# Patient Record
Sex: Male | Born: 1935 | Race: White | Hispanic: No | Smoking: Former smoker
Health system: Southern US, Community
[De-identification: ages and names within clinical notes are randomized; demographics above are authoritative.]

## PROBLEM LIST (undated history)

## (undated) DIAGNOSIS — I10 Essential (primary) hypertension: Secondary | ICD-10-CM

## (undated) DIAGNOSIS — N400 Enlarged prostate without lower urinary tract symptoms: Secondary | ICD-10-CM

## (undated) DIAGNOSIS — I714 Abdominal aortic aneurysm, without rupture, unspecified: Secondary | ICD-10-CM

## (undated) DIAGNOSIS — K649 Unspecified hemorrhoids: Secondary | ICD-10-CM

## (undated) HISTORY — DX: Abdominal aortic aneurysm, without rupture: I71.4

## (undated) HISTORY — PX: CAROTID STENT: SHX1301

## (undated) HISTORY — DX: Abdominal aortic aneurysm, without rupture, unspecified: I71.40

---

## 2012-12-19 ENCOUNTER — Encounter (HOSPITAL_COMMUNITY): Payer: Self-pay | Admitting: Nurse Practitioner

## 2012-12-19 ENCOUNTER — Inpatient Hospital Stay (HOSPITAL_COMMUNITY): Payer: Medicare Other

## 2012-12-19 ENCOUNTER — Inpatient Hospital Stay (HOSPITAL_COMMUNITY)
Admission: AD | Admit: 2012-12-19 | Discharge: 2013-01-02 | DRG: 246 | Disposition: A | Discharge status: Home Health | Payer: Medicare Other | Source: Other Acute Inpatient Hospital | Attending: Cardiology | Admitting: Cardiology

## 2012-12-19 DIAGNOSIS — I4949 Other premature depolarization: Secondary | ICD-10-CM | POA: Diagnosis present

## 2012-12-19 DIAGNOSIS — I5021 Acute systolic (congestive) heart failure: Secondary | ICD-10-CM

## 2012-12-19 DIAGNOSIS — K649 Unspecified hemorrhoids: Secondary | ICD-10-CM | POA: Diagnosis present

## 2012-12-19 DIAGNOSIS — M349 Systemic sclerosis, unspecified: Secondary | ICD-10-CM | POA: Diagnosis present

## 2012-12-19 DIAGNOSIS — Z87891 Personal history of nicotine dependence: Secondary | ICD-10-CM

## 2012-12-19 DIAGNOSIS — J9620 Acute and chronic respiratory failure, unspecified whether with hypoxia or hypercapnia: Secondary | ICD-10-CM | POA: Diagnosis present

## 2012-12-19 DIAGNOSIS — I509 Heart failure, unspecified: Secondary | ICD-10-CM

## 2012-12-19 DIAGNOSIS — I491 Atrial premature depolarization: Secondary | ICD-10-CM | POA: Diagnosis present

## 2012-12-19 DIAGNOSIS — I129 Hypertensive chronic kidney disease with stage 1 through stage 4 chronic kidney disease, or unspecified chronic kidney disease: Secondary | ICD-10-CM | POA: Diagnosis present

## 2012-12-19 DIAGNOSIS — I25810 Atherosclerosis of coronary artery bypass graft(s) without angina pectoris: Secondary | ICD-10-CM

## 2012-12-19 DIAGNOSIS — N390 Urinary tract infection, site not specified: Secondary | ICD-10-CM | POA: Diagnosis present

## 2012-12-19 DIAGNOSIS — K224 Dyskinesia of esophagus: Secondary | ICD-10-CM | POA: Diagnosis not present

## 2012-12-19 DIAGNOSIS — N189 Chronic kidney disease, unspecified: Secondary | ICD-10-CM | POA: Diagnosis present

## 2012-12-19 DIAGNOSIS — N179 Acute kidney failure, unspecified: Secondary | ICD-10-CM | POA: Diagnosis present

## 2012-12-19 DIAGNOSIS — I214 Non-ST elevation (NSTEMI) myocardial infarction: Secondary | ICD-10-CM

## 2012-12-19 DIAGNOSIS — E876 Hypokalemia: Secondary | ICD-10-CM | POA: Diagnosis not present

## 2012-12-19 DIAGNOSIS — J984 Other disorders of lung: Secondary | ICD-10-CM | POA: Diagnosis not present

## 2012-12-19 DIAGNOSIS — J8410 Pulmonary fibrosis, unspecified: Secondary | ICD-10-CM

## 2012-12-19 DIAGNOSIS — I2510 Atherosclerotic heart disease of native coronary artery without angina pectoris: Secondary | ICD-10-CM | POA: Diagnosis present

## 2012-12-19 DIAGNOSIS — J189 Pneumonia, unspecified organism: Secondary | ICD-10-CM

## 2012-12-19 DIAGNOSIS — I2589 Other forms of chronic ischemic heart disease: Secondary | ICD-10-CM | POA: Diagnosis present

## 2012-12-19 DIAGNOSIS — N400 Enlarged prostate without lower urinary tract symptoms: Secondary | ICD-10-CM | POA: Diagnosis present

## 2012-12-19 DIAGNOSIS — R911 Solitary pulmonary nodule: Secondary | ICD-10-CM | POA: Diagnosis not present

## 2012-12-19 DIAGNOSIS — I2584 Coronary atherosclerosis due to calcified coronary lesion: Secondary | ICD-10-CM | POA: Diagnosis present

## 2012-12-19 DIAGNOSIS — R918 Other nonspecific abnormal finding of lung field: Secondary | ICD-10-CM

## 2012-12-19 HISTORY — DX: Unspecified hemorrhoids: K64.9

## 2012-12-19 HISTORY — DX: Benign prostatic hyperplasia without lower urinary tract symptoms: N40.0

## 2012-12-19 HISTORY — DX: Essential (primary) hypertension: I10

## 2012-12-19 LAB — POCT I-STAT 3, ART BLOOD GAS (G3+)
pCO2 arterial: 31.6 mmHg — ABNORMAL LOW (ref 35.0–45.0)
pH, Arterial: 7.517 — ABNORMAL HIGH (ref 7.350–7.450)

## 2012-12-19 LAB — HEMOGLOBIN A1C: Mean Plasma Glucose: 131 mg/dL — ABNORMAL HIGH (ref <117)

## 2012-12-19 LAB — TROPONIN I
Troponin I: 8.56 ng/mL (ref <0.30)
Troponin I: 9.67 ng/mL (ref <0.30)

## 2012-12-19 LAB — MRSA PCR SCREENING: MRSA by PCR: NEGATIVE

## 2012-12-19 MED ORDER — ACETAMINOPHEN 325 MG PO TABS
650.0000 mg | ORAL_TABLET | ORAL | Status: DC | PRN
  Administered 2012-12-19: 650 mg via ORAL
  Filled 2012-12-19: qty 2

## 2012-12-19 MED ORDER — ONDANSETRON HCL 4 MG/2ML IJ SOLN: 4.0000 mg | Freq: Four times a day (QID) | INTRAMUSCULAR | Status: DC | PRN

## 2012-12-19 MED ORDER — ATORVASTATIN CALCIUM 80 MG PO TABS
80.0000 mg | ORAL_TABLET | Freq: Every day | ORAL | Status: DC
  Administered 2012-12-19 – 2013-01-01 (×14): 80 mg via ORAL
  Filled 2012-12-19 (×16): qty 1

## 2012-12-19 MED ORDER — NITROGLYCERIN IN D5W 200-5 MCG/ML-% IV SOLN
2.0000 ug/min | INTRAVENOUS | Status: DC
  Administered 2012-12-19: 3 ug/min via INTRAVENOUS
  Administered 2012-12-20: 19 ug/min via INTRAVENOUS
  Administered 2012-12-21: 20 ug/min via INTRAVENOUS
  Filled 2012-12-19 (×2): qty 250

## 2012-12-19 MED ORDER — SODIUM CHLORIDE 0.9 % IJ SOLN
3.0000 mL | Freq: Two times a day (BID) | INTRAMUSCULAR | Status: DC
  Administered 2012-12-19: 6 mL via INTRAVENOUS
  Administered 2012-12-20 – 2013-01-01 (×11): 3 mL via INTRAVENOUS

## 2012-12-19 MED ORDER — HEPARIN (PORCINE) IN NACL 100-0.45 UNIT/ML-% IJ SOLN
1450.0000 [IU]/h | INTRAMUSCULAR | Status: DC
  Administered 2012-12-19: 1200 [IU]/h via INTRAVENOUS
  Filled 2012-12-19 (×2): qty 250

## 2012-12-19 MED ORDER — SODIUM CHLORIDE 0.9 % IJ SOLN: 3.0000 mL | INTRAMUSCULAR | Status: DC | PRN

## 2012-12-19 MED ORDER — ASPIRIN EC 81 MG PO TBEC
81.0000 mg | DELAYED_RELEASE_TABLET | Freq: Every day | ORAL | Status: DC
  Administered 2012-12-20 – 2013-01-02 (×13): 81 mg via ORAL
  Filled 2012-12-19 (×14): qty 1

## 2012-12-19 MED ORDER — SODIUM CHLORIDE 0.9 % IV SOLN: 250.0000 mL | INTRAVENOUS | Status: DC | PRN

## 2012-12-19 MED ORDER — NITROGLYCERIN 0.4 MG SL SUBL: 0.4000 mg | SUBLINGUAL_TABLET | SUBLINGUAL | Status: DC | PRN

## 2012-12-19 MED ORDER — CIPROFLOXACIN HCL 250 MG PO TABS
250.0000 mg | ORAL_TABLET | Freq: Two times a day (BID) | ORAL | Status: DC
  Administered 2012-12-19 – 2012-12-24 (×10): 250 mg via ORAL
  Filled 2012-12-19 (×12): qty 1

## 2012-12-19 MED ORDER — ALBUTEROL SULFATE (5 MG/ML) 0.5% IN NEBU
2.5000 mg | INHALATION_SOLUTION | RESPIRATORY_TRACT | Status: DC | PRN
  Administered 2013-01-01: 2.5 mg via RESPIRATORY_TRACT

## 2012-12-19 MED ORDER — FUROSEMIDE 10 MG/ML IJ SOLN
80.0000 mg | Freq: Two times a day (BID) | INTRAMUSCULAR | Status: DC
  Administered 2012-12-19 – 2012-12-21 (×4): 80 mg via INTRAVENOUS
  Filled 2012-12-19 (×6): qty 8

## 2012-12-19 MED ORDER — CARVEDILOL 3.125 MG PO TABS
3.1250 mg | ORAL_TABLET | Freq: Two times a day (BID) | ORAL | Status: DC
  Administered 2012-12-19 – 2012-12-28 (×19): 3.125 mg via ORAL
  Filled 2012-12-19 (×24): qty 1

## 2012-12-19 MED FILL — Furosemide Inj 10 MG/ML: INTRAMUSCULAR | Qty: 10 | Status: AC

## 2012-12-19 NOTE — Progress Notes (Addendum)
ANTICOAGULATION CONSULT NOTE - Initial Consult  Pharmacy Consult for Heparin Indication: chest pain/ACS  No Known Allergies  Patient Measurements: 80 kg  Vital Signs: Temp: 97.9 F (36.6 C) (05/01 1500) Temp src: Oral (05/01 1500) BP: 126/67 mmHg (05/01 1500) Pulse Rate: 84 (05/01 1500)  Labs: No results found for this basename: HGB, HCT, PLT, APTT, LABPROT, INR, HEPARINUNFRC, CREATININE, CKTOTAL, CKMB, TROPONINI,  in the last 72 hours  CrCl is unknown because no creatinine reading has been taken and the patient has no height on file.   Medical History: Past Medical History  Diagnosis Date  . Hypertension   . BPH (benign prostatic hyperplasia)   . Hemorrhoids    Assessment: 79 year old male transferred from [facility] with CP.  For cardiac cath in AM.  Had Lovenox at 12:30 pm today  Goal of Therapy:  Heparin level 0.3-0.7 units/ml Monitor platelets by anticoagulation protocol: Yes   Plan:  1) No heparin bolus 2) Heparin drip at 1200 units / hr starting at 9 pm 3) Daily heparin level / CBC  Thank you. Okey Regal, PharmD [phone]  12/19/2012,3:57 PM

## 2012-12-19 NOTE — Progress Notes (Signed)
CRITICAL VALUE ALERT  Critical value received:  Troponin 9.67  Date of notification:  12/19/2012   Time of notification:  2227  Critical value read back:yes  Nurse who received alert:  Viviano Simas   MD notified (1st page):  Leeann Must  Time of first page:  2227  MD notified (2nd page):  Time of second page:  Responding MD:  Leeann Must  Time MD responded:  2227

## 2012-12-19 NOTE — H&P (Addendum)
Patient ID: Christopher [name] MRN: [id number], DOB/AGE: [birth year]   Admit date: 12/19/2012  Primary Physician: Harlow Asa, MD Primary Cardiologist: new - f/u [facility].  Pt. Profile:  79  Y/o male w/o prior cardiac hx who presents on tx from [facility] with NSTEMI.  Problem List  Past Medical History  Diagnosis Date  . Hypertension   . BPH (benign prostatic hyperplasia)   . Hemorrhoids     No past surgical history on file.   Allergies  Allergies not on file  HPI  79 y/o male without prior cardiac history.  He sees his PCP about once annually and overall had been doing well.  He was in his usual state of health until approximately one month ago when he began to experience exertional substernal chest pressure associated with dyspnea, lasting 5-10 minutes, and resolving with rest. Symptoms occur most days of the week or on any day that he tries to exert himself above a particular level. Over the past week however, exertional chest pressure and dyspnea have been occurring with less and less activity and have even occurred with rest. Last night, he was awakened abruptly at approximately 1 AM with recurrent chest pressure and dyspnea. he says he sat and relaxed for a little while but symptoms never really improved and he subsequently presented to [facility] hospital this morning. There, his ECG showed an irregular rhythm with frequent PACs and PVCs. Chest x-ray suggested pulmonary edema and lab work returned with a troponin of 5.30, and CK-MB of 49.5.  He was treated with 40 mg of IV Lasix at 1053 this morning and this was followed by 80 mg of subcutaneous Lovenox at 12:16 PM. He was placed on a nonrebreather mask  And [facility].he is currently chest pain-free. He remains on nonrebreather however is in no acute distress. On telemetry he is in appears to be a wandering atrial pacemaker the first degree AV block, frequent PACs, and PVCs. Repeat 12-lead ECG is pending.    Home Medications   amlodipine 10 mg daily  TMP-SMX twice a day (diagnosed with UTI on Monday, April 28).    Family History  Family History  Problem Relation Age of Onset  . Other      no premature CAD.   Social History  History   Social History  . Marital Status: Widowed    Spouse Name: N/A    Number of Children: N/A  . Years of Education: N/A   Occupational History  . Not on file.   Social History Main Topics  . Smoking status: Former Smoker -- 20 years  . Smokeless tobacco: Not on file     Comment: quit over 40 yrs ago.  . Alcohol Use: No  . Drug Use: Not on file  . Sexually Active: Not on file   Other Topics Concern  . Not on file   Social History Narrative   Lives in [place] with his nephew.  Retired Visual merchandiser and other odd jobs.    Review of Systems General:  No chills, fever, night sweats or weight changes.  Cardiovascular:  +++ chest pain and dyspnea on exertion.  No edema, +++ orthopnea during the night last night.  No palpitations, +++ paroxysmal nocturnal dyspnea last night. Dermatological: No rash, lesions/masses Respiratory: No cough, +++ dyspnea Urologic: No hematuria, +++ dysuria - Dx with UTI earlier this week. Abdominal:   H/o hemorrhoids.  No nausea, vomiting, diarrhea, bright red blood per rectum, melena, or hematemesis Neurologic:  No visual changes, wkns, changes in mental status. All other systems reviewed and are otherwise negative except as noted above.  Physical Exam  Blood pressure 126/67, pulse 84, temperature 97.9 F (36.6 C), temperature source Oral, resp. rate 18, SpO2 94.00%.  General: Pleasant, NAD Psych: Normal affect. Neuro: Alert and oriented X 3. Moves all extremities spontaneously. HEENT: Normal  Neck: Supple without bruits.  jvp to jaw. Lungs:  Resp regular and unlabored, crackles 1/2 way bilat and anterioraly. Heart: RRR no s3, s4, or murmurs. Abdomen: firm, distended, non-tender, BS + x 4.  Extremities: No  clubbing, cyanosis.  2+ bilat LE edema. DP/PT/Radials 1+ and equal bilaterally.  Labs  INR 1.1  u 832  Sodium 137, potassium 3.3, chloride 99, CO2 26, BUN 28, creatinine 1.4 to, glucose 195  CK 333, MB 49.5, troponin I 5.30  Hemoglobin 12.6, hematocrit 37.3, WBC 12.9, platelets 206  Urinalysis showed moderate blood, positive nitrites, moderate leukocyte esterase, too numerous to count WBCs, large bacteria, moderate mucus   Radiology/Studies  No results found.  ECG  ECG from [facility] hospital appears to be a wandering atrial pacemaker with frequent PACs and PVCs. Because of ectopy and wavy baseline, ST segment analysis is difficult, but he does appear to have some lateral ST segment depression and also poor R-wave progression. Repeat ECG is pending here.  ASSESSMENT AND PLAN  1. Non-ST segment elevation myocardial infarction: Patient presents with a 1+ month history of progressive exertional angina and dyspnea but acutely worsened during the night and he is now found to have elevated cardiac markers consistent with non-ST segment elevation myocardial infarction. He is currently pain-free. Plan to admit to ICU , continue to cycle cardiac markers, at heparin later as he received 80 mg of subcutaneous Lovenox at 12:16 PM today. Add aspirin, high potency statin, IV ntg, and low-dose beta blocker therapy. He will require diagnostic catheterization in the AM provided that renal fxn is stable and resp status improves.  2. Acute congestive heart failure: in the setting of #1. LV function is unknown and we will obtain a 2-D echocardiogram to evaluate. He will require intravenous diuresis and we will have to watch his creatinine closely throughout this period he did receive 40 mg of IV Lasix at 1053 this morning, and will treat with 80 mg IV twice a day, starting now. Adding beta blocker as above however will withhold ACE inhibitor/ARB at this time secondary to renal insufficiency.  3.  Hypertension: Currently stable. In light of ongoing diuresis and initiation of beta blocker therapy, will hold his home dose of amlodipine.  4. Lipid status: Currently unknown. Check lipids and LFTs. Add high dose statin.  5. Urinary tract infection: He was placed on Septra this past Monday and despite this continues to have an abnormal urinalysis. We'll switch to ciprofloxacin.  6. Renal insufficiency: Chronicity unknown. Follow closely with diuresis.   Signed, Nicolasa Ducking, NP 12/19/2012, 3:12 PM  Patient seen with NP, agree with the above note.  Patient has no prior cardiac symptoms.  He has had crescendo angina for about 1 month, gradually worsening until early this morning when he had on and off chest pressure at rest that lasted for several hours.  He is now pain-free.  ECG is difficult but shows wandering atrial pacemaker with possible old anteroseptal MI. Troponin 5.  1. CAD: NSTEMI (but with anteroseptal Qs).  Crescendo chest pain x 1 month, prolonged early this morning, now resolved and CP-free completely.  - LHC in  am - Heparin gtt (12 hours after last Lovenox dose), ASA, statin, low dose Coreg 3.125 mg bid.  2. CHF: Suspect acute systolic CHF in the setting of MI.  He is volume overloaded on exam with elevated JVP and pulmonary edema.  O2 saturation 92% on NRBM.   - Agree with Lasix 80 mg IV bid with 1st dose now, hold am dose prior to cath.  - He looks comfortable but with marginal saturation may need Bipap.  Will get ABG.  - Hold ACEI for now with mild creatinine elevation, will add after cath if creatinine remains stable.  - Echo today.  3. Rhythm: Wandering atrial pacemaker.  Not sure what his baseline is.   Marca Ancona 12/19/2012 4:02 PM

## 2012-12-19 NOTE — Progress Notes (Signed)
Brief X-Cover note  I evaluated Mr. Covin. He was feeling well. He told me he has had some chills but no fevers or night sweats recently. I reviewed the chart and plan. He has NSTEMI with bilateral right > left pulmonary edema with underlying lung disease. He quit smoking 40+ years ago. On exam, vitals reviewed; BP 135/67  Pulse 82  Temp(Src) 98.3 F (36.8 C) (Axillary)  Resp 28  Ht 5\' 9"  (1.753 m)  Wt 89.5 kg (197 lb 5 oz)  BMI 29.12 kg/m2  SpO2 92%93% on partial NRB. Heart regular rhythm, no overt murmur, abdomen full, ND/NT, BS+, extremities warm, trace edema, lungs with scattered rales and expiratory wheezes. Differential is pulmonary edema vs. Pleural effusion vs. Pneumonia vs. Chronic lung disease. He's stable currently, diuresing on 80 mg IV lasix bid with last UOP 450 ml. He's comfortable. Will plan for current supportive course unless he has any symptoms, change in hemodynamics or oxygen saturation. If symptoms develop, will trial another dose of IV lasix, b2 agonist and/or BIPAP.   Leeann Must, MD

## 2012-12-19 NOTE — Progress Notes (Signed)
CRITICAL VALUE ALERT  Critical value received:  Troponin 8.56  Date of notification:  12/19/2012  Time of notification:  1732  Critical value read back:yes  Nurse who received alert:  Ulis Rias, RN  MD notified (1st page):  Theodore Demark  Time of first page:  1745  MD notified (2nd page):  Time of second page:  Responding MD:  Theodore Demark  Time MD responded:  [phone]

## 2012-12-20 ENCOUNTER — Encounter (HOSPITAL_COMMUNITY)
Admission: AD | Disposition: A | Discharge status: Home Health | Payer: Self-pay | Source: Other Acute Inpatient Hospital | Attending: Cardiology

## 2012-12-20 DIAGNOSIS — I2510 Atherosclerotic heart disease of native coronary artery without angina pectoris: Secondary | ICD-10-CM

## 2012-12-20 DIAGNOSIS — I059 Rheumatic mitral valve disease, unspecified: Secondary | ICD-10-CM

## 2012-12-20 HISTORY — PX: LEFT HEART CATHETERIZATION WITH CORONARY ANGIOGRAM: SHX5451

## 2012-12-20 LAB — LIPID PANEL
Cholesterol: 133 mg/dL (ref 0–200)
HDL: 24 mg/dL — ABNORMAL LOW
LDL Cholesterol: 88 mg/dL (ref 0–99)
Total CHOL/HDL Ratio: 5.5 ratio
Triglycerides: 106 mg/dL
VLDL: 21 mg/dL (ref 0–40)

## 2012-12-20 LAB — CBC
HCT: 33.5 % — ABNORMAL LOW (ref 39.0–52.0)
Hemoglobin: 11.6 g/dL — ABNORMAL LOW (ref 13.0–17.0)
MCH: 29.3 pg (ref 26.0–34.0)
MCHC: 34.6 g/dL (ref 30.0–36.0)
MCV: 84.6 fL (ref 78.0–100.0)
Platelets: 215 K/uL (ref 150–400)
RBC: 3.96 MIL/uL — ABNORMAL LOW (ref 4.22–5.81)
RDW: 14.5 % (ref 11.5–15.5)
WBC: 11.5 K/uL — ABNORMAL HIGH (ref 4.0–10.5)

## 2012-12-20 LAB — COMPREHENSIVE METABOLIC PANEL WITH GFR
ALT: 18 U/L (ref 0–53)
AST: 47 U/L — ABNORMAL HIGH (ref 0–37)
Albumin: 2.5 g/dL — ABNORMAL LOW (ref 3.5–5.2)
Alkaline Phosphatase: 46 U/L (ref 39–117)
BUN: 31 mg/dL — ABNORMAL HIGH (ref 6–23)
CO2: 28 meq/L (ref 19–32)
Calcium: 8.5 mg/dL (ref 8.4–10.5)
Chloride: 99 meq/L (ref 96–112)
Creatinine, Ser: 1.55 mg/dL — ABNORMAL HIGH (ref 0.50–1.35)
GFR calc Af Amer: 46 mL/min — ABNORMAL LOW
GFR calc non Af Amer: 40 mL/min — ABNORMAL LOW
Glucose, Bld: 118 mg/dL — ABNORMAL HIGH (ref 70–99)
Potassium: 3.5 meq/L (ref 3.5–5.1)
Sodium: 138 meq/L (ref 135–145)
Total Bilirubin: 0.5 mg/dL (ref 0.3–1.2)
Total Protein: 6.4 g/dL (ref 6.0–8.3)

## 2012-12-20 LAB — TROPONIN I: Troponin I: 10.59 ng/mL

## 2012-12-20 SURGERY — LEFT HEART CATHETERIZATION WITH CORONARY ANGIOGRAM
Anesthesia: LOCAL

## 2012-12-20 MED ORDER — CLOPIDOGREL BISULFATE 75 MG PO TABS
75.0000 mg | ORAL_TABLET | Freq: Every day | ORAL | Status: DC
  Administered 2012-12-20 – 2013-01-02 (×14): 75 mg via ORAL
  Filled 2012-12-20 (×16): qty 1

## 2012-12-20 MED ORDER — ALBUTEROL SULFATE (5 MG/ML) 0.5% IN NEBU
2.5000 mg | INHALATION_SOLUTION | Freq: Four times a day (QID) | RESPIRATORY_TRACT | Status: DC
  Administered 2012-12-20 – 2012-12-25 (×21): 2.5 mg via RESPIRATORY_TRACT
  Filled 2012-12-20 (×21): qty 0.5

## 2012-12-20 MED ORDER — MIDAZOLAM HCL 2 MG/2ML IJ SOLN
INTRAMUSCULAR | Status: AC
  Filled 2012-12-20: qty 2

## 2012-12-20 MED ORDER — HEPARIN (PORCINE) IN NACL 2-0.9 UNIT/ML-% IJ SOLN
INTRAMUSCULAR | Status: AC
  Filled 2012-12-20: qty 1000

## 2012-12-20 MED ORDER — HEPARIN SODIUM (PORCINE) 1000 UNIT/ML IJ SOLN
INTRAMUSCULAR | Status: AC
  Filled 2012-12-20: qty 1

## 2012-12-20 MED ORDER — HEPARIN (PORCINE) IN NACL 100-0.45 UNIT/ML-% IJ SOLN
1750.0000 [IU]/h | INTRAMUSCULAR | Status: DC
  Administered 2012-12-21: 1750 [IU]/h via INTRAVENOUS
  Filled 2012-12-20 (×4): qty 250

## 2012-12-20 MED ORDER — SODIUM CHLORIDE 0.9 % IV SOLN: INTRAVENOUS | Status: DC

## 2012-12-20 MED ORDER — ASPIRIN 81 MG PO CHEW
324.0000 mg | CHEWABLE_TABLET | ORAL | Status: AC
  Administered 2012-12-20: 324 mg via ORAL
  Filled 2012-12-20: qty 4

## 2012-12-20 MED ORDER — VERAPAMIL HCL 2.5 MG/ML IV SOLN
INTRAVENOUS | Status: AC
  Filled 2012-12-20: qty 2

## 2012-12-20 MED ORDER — FENTANYL CITRATE 0.05 MG/ML IJ SOLN
INTRAMUSCULAR | Status: AC
  Filled 2012-12-20: qty 2

## 2012-12-20 MED ORDER — LIDOCAINE HCL (PF) 1 % IJ SOLN
INTRAMUSCULAR | Status: AC
  Filled 2012-12-20: qty 30

## 2012-12-20 NOTE — Progress Notes (Signed)
ANTICOAGULATION CONSULT NOTE - Follow Up Consult  Pharmacy Consult for Heparin Indication: chest pain/ACS  No Known Allergies  Patient Measurements: 80 kg  Vital Signs: Temp: 97.7 F (36.5 C) (05/02 0000) Temp src: Oral (05/02 0000) BP: 133/80 mmHg (05/02 0315) Pulse Rate: 77 (05/02 0315)  Labs:  Recent Labs  12/19/12 1632 12/19/12 2050 12/20/12 0330  HGB  --   --  11.6*  HCT  --   --  33.5*  PLT  --   --  215  HEPARINUNFRC  --   --  0.19*  TROPONINI 8.56* 9.67*  --     CrCl is unknown because no creatinine reading has been taken.   Medical History: Past Medical History  Diagnosis Date  . Hypertension   . BPH (benign prostatic hyperplasia)   . Hemorrhoids    Assessment: 79 year old male transferred from [facility] with CP.  For cardiac cath in AM. Heparin level (0.19) is below-goal on 1200 units/hr. No problem with line / infusion and no bleeding per RN.   Goal of Therapy:  Heparin level 0.3-0.7 units/ml Monitor platelets by anticoagulation protocol: Yes   Plan:  1. Increase IV heparin to 1450 units/hr 2. Heparin level in 8 hours vs follow-up post-cath.   Lorre Munroe, PharmD 12/20/2012,3:58 AM

## 2012-12-20 NOTE — Progress Notes (Signed)
Utilization Review Completed. 12/20/2012

## 2012-12-20 NOTE — Interval H&P Note (Signed)
History and Physical Interval Note:  12/20/2012 7:59 AM  Christopher [name]  has presented today for surgery, with the diagnosis of Chest pain  The various methods of treatment have been discussed with the patient and family. After consideration of risks, benefits and other options for treatment, the patient has consented to  Procedure(s): LEFT HEART CATHETERIZATION WITH CORONARY ANGIOGRAM (N/A) as a surgical intervention .  The patient's history has been reviewed, patient examined, no change in status, stable for surgery.  I have reviewed the patient's chart and labs.  Questions were answered to the patient's satisfaction.     Theron Arista [facility] 12/20/2012 7:59 AM'

## 2012-12-20 NOTE — CV Procedure (Signed)
   Cardiac Catheterization Procedure Note  Name: Christopher [name] MRN: [id number] DOB: [birth year]  Procedure: Left Heart Cath, Selective Coronary Angiography  Indication: 79 year old white male transferred from [facility] for evaluation of a non-ST elevation myocardial infarction and new onset congestive heart failure.   Procedural Details: The right wrist was prepped, draped, and anesthetized with 1% lidocaine. Using the modified Seldinger technique, a 5 French sheath was introduced into the right radial artery. 3 mg of verapamil was administered through the sheath, weight-based unfractionated heparin was administered intravenously. Standard Judkins catheters were used for selective coronary angiography. Catheter exchanges were performed over an exchange length guidewire. There were no immediate procedural complications. A TR band was used for radial hemostasis at the completion of the procedure.  The patient was transferred to the post catheterization recovery area for further monitoring.  Procedural Findings: Hemodynamics: AO 122/57 with a mean of 84 mmHg LV 120/39 mmHg  Coronary angiography: Coronary dominance: right  Left mainstem: The left main coronary is short with mild disease less than 30%.  Left anterior descending (LAD): The left anterior descending artery is a large vessel extending to the apex. Is moderately calcified in the proximal vessel. After the takeoff of the first diagonal there is a focal 90% stenosis. In the mid LAD there is a segment of 50% disease. The first diagonal has mild disease less than 30-40%.  Left circumflex (LCx): The left circumflex is a relatively small vessel with mild nonobstructive disease less than 20%.  Right coronary artery (RCA): The right coronary is a dominant vessel. It has segmental disease in the proximal vessel up to 80%. It is occluded in the mid vessel with excellent left-to-right collaterals to the entire right coronary.  Left  ventriculography: Not performed due to elevated creatinine and high EDP.  Final Conclusions:   1. Severe two-vessel obstructive coronary artery disease. The right coronary is occluded with excellent collateral flow. There is a high-grade stenosis in the LAD immediately following the first diagonal. 2. Elevated left ventricular filling pressures.  Recommendations: The patient's respiratory status is marginal at this time. He needs continued diuresis and we'll need to watch his creatinine closely. With improvement in his congestive heart failure I think he would be a candidate for stenting of the LAD. We will go ahead and start Plavix now. Continue aggressive medical therapy with IV heparin, nitroglycerin and diuresis.  Theron Arista [facility] 12/20/2012, 8:31 AM

## 2012-12-20 NOTE — Progress Notes (Signed)
  Echocardiogram 2D Echocardiogram has been performed.  Christopher [name] FRANCES 12/20/2012, 5:33 PM

## 2012-12-20 NOTE — Clinical Documentation Improvement (Signed)
RENAL FAILURE DOCUMENTATION CLARIFICATION QUERY  THIS DOCUMENT IS NOT A PERMANENT PART OF THE MEDICAL RECORD  TO RESPOND TO THE THIS QUERY, FOLLOW THE INSTRUCTIONS BELOW:  1. If needed, update documentation for the patient's encounter via the notes activity.  2. Access this query again and click edit on the In [employer].  3. After updating, or not, click F2 to complete all highlighted (required) fields concerning your review. Select "additional documentation in the medical record" OR "no additional documentation provided".  4. Click Sign note button.  5. The deficiency will fall out of your In Basket *Please let us know if you are not able to complete this workflow by phone or e-mail (listed below).  Please update your documentation within the medical record to reflect your response to this query.                                                                                    12/20/12  Dear Dr.McLean,  In a better effort to capture your patient's severity of illness, reflect appropriate length of stay and utilization of resources, a review of the patient medical record has revealed the following indicators.    Based on your clinical judgment, please clarify and document in a progress note and/or discharge summary the clinical condition associated with the following supporting information:  In responding to this query please exercise your independent judgment.  The fact that a query is asked, does not imply that any particular answer is desired or expected.    Possible Clinical Conditions?   _______Acute Renal Failure  _______Acute on Chronic Renal Failure  _______Chronic Renal Failure  _______Acute Renal Insufficiency  _______Chronic Renal Insufficiency  _______Other Condition  _______Cannot Clinically Determine     Risk Factors: Renal Insufficiency noted per 5/01 progress notes.   Lab:  5/02:  Bun:    31 Creat: 1.55 GFR:   40                    You may  use possible, probable, or suspect with inpatient documentation. possible, probable, suspected diagnoses MUST be documented at the time of discharge  Reviewed:  CKD documented per 5/03 and 5/04 progress notes.                       Thank You,  Marciano Sequin,  Clinical Documentation Specialist:  Phone: [phone]  Health Information Management [place]

## 2012-12-20 NOTE — Progress Notes (Signed)
ANTICOAGULATION CONSULT NOTE - Follow Up Consult  Pharmacy Consult for Heparin Indication: chest pain/ACS  No Known Allergies  Patient Measurements: 80 kg   Vital Signs: Temp: 98.3 F (36.8 C) (05/02 1147) Temp src: Oral (05/02 1147) BP: 150/71 mmHg (05/02 1100) Pulse Rate: 96 (05/02 1100)  Labs:  Recent Labs  12/19/12 1632 12/19/12 2050 12/20/12 0330  HGB  --   --  11.6*  HCT  --   --  33.5*  PLT  --   --  215  HEPARINUNFRC  --   --  0.19*  CREATININE  --   --  1.55*  TROPONINI 8.56* 9.67* 10.59*    Estimated Creatinine Clearance: 41 ml/min (by C-G formula based on Cr of 1.55).   Medical History: Past Medical History  Diagnosis Date  . Hypertension   . BPH (benign prostatic hyperplasia)   . Hemorrhoids    Assessment: 79 year old male transferred from [facility] with CP, s/p cardiac cath with severe two-vessel CAD, will need LAD stenting when respiratory and renal function improves. Pharmacy to start heparin infusion 8 hrs post sheath removal (0818) heparin level was 0.19 on 1200 units/hr prior to cath   Goal of Therapy:  Heparin level 0.3-0.7 units/ml Monitor platelets by anticoagulation protocol: Yes   Plan:  1. Heparin with no bolus 1350 units/hr start at 1620 2. Heparin level in 8 hours at 0030 tomorrow AM 3. Daily heparin level and CBC  Bayard Hugger, PharmD, BCPS  Clinical Pharmacist  Pager: [phone]   12/20/2012,12:38 PM

## 2012-12-20 NOTE — Progress Notes (Signed)
MD notified of increased expiratory wheezing at bedside. Pt stable and is not complaining of increased work of breathing.

## 2012-12-21 ENCOUNTER — Inpatient Hospital Stay (HOSPITAL_COMMUNITY): Payer: Medicare Other

## 2012-12-21 LAB — CBC
HCT: 32.2 % — ABNORMAL LOW (ref 39.0–52.0)
MCV: 83.6 fL (ref 78.0–100.0)
RBC: 3.85 MIL/uL — ABNORMAL LOW (ref 4.22–5.81)
WBC: 10.5 10*3/uL (ref 4.0–10.5)

## 2012-12-21 LAB — HEPARIN LEVEL (UNFRACTIONATED): Heparin Unfractionated: 0.23 IU/mL — ABNORMAL LOW (ref 0.30–0.70)

## 2012-12-21 LAB — BASIC METABOLIC PANEL
BUN: 34 mg/dL — ABNORMAL HIGH (ref 6–23)
CO2: 26 mEq/L (ref 19–32)
Chloride: 98 mEq/L (ref 96–112)
Creatinine, Ser: 1.46 mg/dL — ABNORMAL HIGH (ref 0.50–1.35)

## 2012-12-21 MED ORDER — HEPARIN (PORCINE) IN NACL 100-0.45 UNIT/ML-% IJ SOLN
2400.0000 [IU]/h | INTRAMUSCULAR | Status: DC
  Administered 2012-12-22: 2000 [IU]/h via INTRAVENOUS
  Administered 2012-12-24: 2400 [IU]/h via INTRAVENOUS
  Filled 2012-12-21 (×8): qty 250

## 2012-12-21 MED ORDER — FUROSEMIDE 10 MG/ML IJ SOLN
100.0000 mg | Freq: Two times a day (BID) | INTRAVENOUS | Status: DC
  Administered 2012-12-21 – 2012-12-22 (×3): 100 mg via INTRAVENOUS
  Filled 2012-12-21 (×4): qty 10

## 2012-12-21 NOTE — Progress Notes (Signed)
ANTICOAGULATION CONSULT NOTE  Pharmacy Consult for Heparin Indication: chest pain/ACS  No Known Allergies  Patient Measurements: Ht: 69 in Wt: 91 kg IBW: 71 kg  Vital Signs: Temp: 98.1 F (36.7 C) (05/03 1204) Temp src: Oral (05/03 1204) BP: 115/63 mmHg (05/03 1200) Pulse Rate: 82 (05/03 1200)  Labs:  Recent Labs  12/19/12 1632 12/19/12 2050 12/20/12 0330 12/21/12 0020 12/21/12 0032 12/21/12 1020  HGB  --   --  11.6*  --  11.2*  --   HCT  --   --  33.5*  --  32.2*  --   PLT  --   --  215  --  229  --   HEPARINUNFRC  --   --  0.19* <0.10*  --  0.23*  CREATININE  --   --  1.55*  --  1.46*  --   TROPONINI 8.56* 9.67* 10.59*  --   --   --     Estimated Creatinine Clearance: 43.5 ml/min (by C-G formula based on Cr of 1.46).  Assessment: 79 year old male with CAD s/p cath, awaiting PCI of LAD when medically able. Heparin level subtherapeutic on 1550 units/hr. No bleeding noted. CBC stable.  Goal of Therapy:  Heparin level 0.3-0.7 units/ml Monitor platelets by anticoagulation protocol: Yes   Plan:  Increase Heparin to 1750 units/hr Check heparin level in 8 hours.  Christoper Fabian, PharmD, BCPS Clinical pharmacist, pager [phone]  12/21/2012,12:56 PM

## 2012-12-21 NOTE — Progress Notes (Signed)
SUBJECTIVE:  He says that his breathing is not quite at baseline.  However, he has had no chest pain and feels better than on admission.   PHYSICAL EXAM Filed Vitals:   12/21/12 0752 12/21/12 0800 12/21/12 0900 12/21/12 1000  BP:  124/63 122/59 128/49  Pulse:  73 86 76  Temp:      TempSrc:      Resp:  21 27 22   Height:      Weight:      SpO2: 95% 93% 89% 89%   General:  No acute distress HEENT:  PERRL Neck:  6 cm JVD at 45 degrees Lungs:  Decreased breath sounds with bilateral crackles Heart:  RRR Abdomen:  Positive bowel sounds, no rebound no guarding Extremities:  Trace edema Neuro:  Nonfocal  LABS: Lab Results  Component Value Date   TROPONINI 10.59* 12/20/2012   Results for orders placed during the hospital encounter of 12/19/12 (from the past 24 hour(s))  HEPARIN LEVEL (UNFRACTIONATED)     Status: Abnormal   Collection Time    12/21/12 12:20 AM      Result Value Range   Heparin Unfractionated <0.10 (*) 0.30 - 0.70 IU/mL  CBC     Status: Abnormal   Collection Time    12/21/12 12:32 AM      Result Value Range   WBC 10.5  4.0 - 10.5 K/uL   RBC 3.85 (*) 4.22 - 5.81 MIL/uL   Hemoglobin 11.2 (*) 13.0 - 17.0 g/dL   HCT 16.1 (*) 09.6 - 04.5 %   MCV 83.6  78.0 - 100.0 fL   MCH 29.1  26.0 - 34.0 pg   MCHC 34.8  30.0 - 36.0 g/dL   RDW 40.9  81.1 - 91.4 %   Platelets 229  150 - 400 K/uL  BASIC METABOLIC PANEL     Status: Abnormal   Collection Time    12/21/12 12:32 AM      Result Value Range   Sodium 136  135 - 145 mEq/L   Potassium 3.6  3.5 - 5.1 mEq/L   Chloride 98  96 - 112 mEq/L   CO2 26  19 - 32 mEq/L   Glucose, Bld 103 (*) 70 - 99 mg/dL   BUN 34 (*) 6 - 23 mg/dL   Creatinine, Ser 7.82 (*) 0.50 - 1.35 mg/dL   Calcium 8.3 (*) 8.4 - 10.5 mg/dL   GFR calc non Af Amer 43 (*) >90 mL/min   GFR calc Af Amer 50 (*) >90 mL/min  HEPARIN LEVEL (UNFRACTIONATED)     Status: Abnormal   Collection Time    12/21/12 10:20 AM      Result Value Range   Heparin  Unfractionated 0.23 (*) 0.30 - 0.70 IU/mL    Intake/Output Summary (Last 24 hours) at 12/21/12 1050 Last data filed at 12/21/12 1000  Gross per 24 hour  Intake   1824 ml  Output   1800 ml  Net     24 ml    EKG:  NSR, rate 67.  Axis WNL.  PACs and PVCs.  No acute ST T wave changes.  12/21/2012  ASSESSMENT AND PLAN:  NSTEMI:  2 vessel CAD as above.  Plan is PCI of LAD once he is medically able for another procedure.  On Plavix and ASA.  Continue heparin and NTG IV for afterload reduction today.     CAD:  As above  CKD:  Creat is stable.  Follow in AM.  ISCHEMIC CARDIOMYOPATHY:  EF is severely reduced (25%).  I am going to increase his Lasix today following the creat closely.  His sats remain low.  I will hold off on starting an ACE inhibitor given the soft BP but perhaps start this in the AM.   UTI:  Day number 2 of Cipro.  Continue. His UTI persisted despite Bactrim.  He will need a follow up UTI after completing antibiotics.    Fayrene Fearing [facility] 12/21/2012 10:50 AM

## 2012-12-21 NOTE — Progress Notes (Signed)
ANTICOAGULATION CONSULT NOTE  Pharmacy Consult for Heparin Indication: chest pain/ACS  No Known Allergies  Patient Measurements: Ht: 69 in Wt: 91 kg IBW: 71 kg  Vital Signs: Temp: 98.2 F (36.8 C) (05/03 1558) Temp src: Oral (05/03 1558) BP: 134/67 mmHg (05/03 1900) Pulse Rate: 83 (05/03 1900)  Labs:  Recent Labs  12/19/12 1632 12/19/12 2050  12/20/12 0330 12/21/12 0020 12/21/12 0032 12/21/12 1020 12/21/12 2005  HGB  --   --   --  11.6*  --  11.2*  --   --   HCT  --   --   --  33.5*  --  32.2*  --   --   PLT  --   --   --  215  --  229  --   --   HEPARINUNFRC  --   --   < > 0.19* <0.10*  --  0.23* 0.23*  CREATININE  --   --   --  1.55*  --  1.46*  --   --   TROPONINI 8.56* 9.67*  --  10.59*  --   --   --   --   < > = values in this interval not displayed.  Estimated Creatinine Clearance: 43.5 ml/min (by C-G formula based on Cr of 1.46).  Assessment: 79 year old male with CAD s/p cath, awaiting PCI of LAD when medically able. Heparin level is still subtherapeutic on 1750 units/hr.    Goal of Therapy:  Heparin level 0.3-0.7 units/ml Monitor platelets by anticoagulation protocol: Yes   Plan:  -Increase heparin to  2000 units/hr -Recheck a heparin level in 6 hrs  Harland German, Pharm D 12/21/2012 8:34 PM

## 2012-12-21 NOTE — Progress Notes (Signed)
ANTICOAGULATION CONSULT NOTE  Pharmacy Consult for Heparin Indication: chest pain/ACS  No Known Allergies  Patient Measurements: 80 kg  Vital Signs: Temp: 97.6 F (36.4 C) (05/03 0029) Temp src: Oral (05/03 0029) BP: 116/64 mmHg (05/02 2300) Pulse Rate: 82 (05/02 2300)  Labs:  Recent Labs  12/19/12 1632 12/19/12 2050 12/20/12 0330 12/21/12 0020 12/21/12 0032  HGB  --   --  11.6*  --  11.2*  HCT  --   --  33.5*  --  32.2*  PLT  --   --  215  --  229  HEPARINUNFRC  --   --  0.19* <0.10*  --   CREATININE  --   --  1.55*  --  1.46*  TROPONINI 8.56* 9.67* 10.59*  --   --     Estimated Creatinine Clearance: 43.5 ml/min (by C-G formula based on Cr of 1.46).  Assessment: 79 year old male with CAD s/p cath, awaiting PCI, for heparin   Goal of Therapy:  Heparin level 0.3-0.7 units/ml Monitor platelets by anticoagulation protocol: Yes   Plan:  Increase Heparin 1550 units/hr Check heparin level in 8 hours.  Geannie Risen, PharmD, BCPS 12/21/2012,1:40 AM

## 2012-12-22 ENCOUNTER — Inpatient Hospital Stay (HOSPITAL_COMMUNITY): Payer: Medicare Other

## 2012-12-22 DIAGNOSIS — I25810 Atherosclerosis of coronary artery bypass graft(s) without angina pectoris: Secondary | ICD-10-CM

## 2012-12-22 LAB — POCT I-STAT 3, ART BLOOD GAS (G3+)
Acid-Base Excess: 2 mmol/L (ref 0.0–2.0)
Bicarbonate: 24.9 meq/L — ABNORMAL HIGH (ref 20.0–24.0)
O2 Saturation: 96 %
TCO2: 26 mmol/L (ref 0–100)
pCO2 arterial: 34.2 mmHg — ABNORMAL LOW (ref 35.0–45.0)
pH, Arterial: 7.47 — ABNORMAL HIGH (ref 7.350–7.450)
pO2, Arterial: 74 mmHg — ABNORMAL LOW (ref 80.0–100.0)

## 2012-12-22 LAB — BASIC METABOLIC PANEL
CO2: 26 mEq/L (ref 19–32)
CO2: 27 mEq/L (ref 19–32)
Calcium: 8.3 mg/dL — ABNORMAL LOW (ref 8.4–10.5)
Chloride: 99 mEq/L (ref 96–112)
Chloride: 98 mEq/L (ref 96–112)
Creatinine, Ser: 1.36 mg/dL — ABNORMAL HIGH (ref 0.50–1.35)
Glucose, Bld: 117 mg/dL — ABNORMAL HIGH (ref 70–99)
Potassium: 3.3 mEq/L — ABNORMAL LOW (ref 3.5–5.1)
Sodium: 138 mEq/L (ref 135–145)

## 2012-12-22 LAB — CBC
Hemoglobin: 11.1 g/dL — ABNORMAL LOW (ref 13.0–17.0)
MCH: 28.8 pg (ref 26.0–34.0)
MCV: 83.9 fL (ref 78.0–100.0)
RBC: 3.85 MIL/uL — ABNORMAL LOW (ref 4.22–5.81)

## 2012-12-22 LAB — BASIC METABOLIC PANEL WITH GFR
BUN: 32 mg/dL — ABNORMAL HIGH (ref 6–23)
CO2: 29 meq/L (ref 19–32)
Calcium: 8.4 mg/dL (ref 8.4–10.5)
Chloride: 101 meq/L (ref 96–112)
Creatinine, Ser: 1.19 mg/dL (ref 0.50–1.35)
GFR calc Af Amer: 64 mL/min — ABNORMAL LOW (ref >90)
GFR calc non Af Amer: 55 mL/min — ABNORMAL LOW (ref >90)
Glucose, Bld: 121 mg/dL — ABNORMAL HIGH (ref 70–99)
Potassium: 3 meq/L — ABNORMAL LOW (ref 3.5–5.1)
Sodium: 141 meq/L (ref 135–145)

## 2012-12-22 LAB — CARBOXYHEMOGLOBIN
Methemoglobin: 0.8 % (ref 0.0–1.5)
O2 Saturation: 64.8 %
Total hemoglobin: 12.4 g/dL — ABNORMAL LOW (ref 13.5–18.0)

## 2012-12-22 LAB — PRO B NATRIURETIC PEPTIDE: Pro B Natriuretic peptide (BNP): 7018 pg/mL — ABNORMAL HIGH (ref 0–450)

## 2012-12-22 MED ORDER — POTASSIUM CHLORIDE CRYS ER 20 MEQ PO TBCR
20.0000 meq | EXTENDED_RELEASE_TABLET | Freq: Two times a day (BID) | ORAL | Status: DC
  Administered 2012-12-22: 20 meq via ORAL
  Filled 2012-12-22 (×3): qty 1

## 2012-12-22 MED ORDER — FUROSEMIDE 10 MG/ML IJ SOLN
8.0000 mg/h | INTRAVENOUS | Status: DC
  Administered 2012-12-22 – 2012-12-24 (×2): 8 mg/h via INTRAVENOUS
  Filled 2012-12-22 (×2): qty 25

## 2012-12-22 MED ORDER — MILRINONE IN DEXTROSE 20 MG/100ML IV SOLN
0.1250 ug/kg/min | INTRAVENOUS | Status: DC
  Administered 2012-12-22 – 2012-12-23 (×2): 0.125 ug/kg/min via INTRAVENOUS
  Filled 2012-12-22 (×2): qty 100

## 2012-12-22 MED ORDER — SODIUM CHLORIDE 0.9 % IJ SOLN
10.0000 mL | INTRAMUSCULAR | Status: DC | PRN
  Administered 2012-12-25 – 2012-12-27 (×5): 10 mL
  Administered 2012-12-28: 30 mL
  Administered 2012-12-29 (×4): 10 mL
  Administered 2012-12-30: 40 mL
  Administered 2012-12-31: 10 mL
  Administered 2013-01-01 – 2013-01-02 (×4): 30 mL

## 2012-12-22 MED ORDER — NITROGLYCERIN 2 % TD OINT
1.0000 [in_us] | TOPICAL_OINTMENT | TRANSDERMAL | Status: DC
  Administered 2012-12-22 – 2012-12-25 (×9): 1 [in_us] via TOPICAL
  Filled 2012-12-22: qty 30

## 2012-12-22 MED ORDER — DOPAMINE-DEXTROSE 3.2-5 MG/ML-% IV SOLN
2.0000 ug/kg/min | INTRAVENOUS | Status: DC
  Administered 2012-12-22: 2 ug/kg/min via INTRAVENOUS
  Filled 2012-12-22: qty 250

## 2012-12-22 MED ORDER — SODIUM CHLORIDE 0.9 % IJ SOLN
10.0000 mL | Freq: Two times a day (BID) | INTRAMUSCULAR | Status: DC
  Administered 2012-12-22: 12 mL
  Administered 2012-12-23: 30 mL
  Administered 2012-12-23 – 2012-12-26 (×5): 10 mL
  Administered 2012-12-29: 30 mL
  Administered 2012-12-31: 10 mL

## 2012-12-22 MED ORDER — NITROGLYCERIN 2 % TD OINT
1.0000 [in_us] | TOPICAL_OINTMENT | Freq: Four times a day (QID) | TRANSDERMAL | Status: DC
  Filled 2012-12-22: qty 30

## 2012-12-22 MED ORDER — POTASSIUM CHLORIDE CRYS ER 20 MEQ PO TBCR
40.0000 meq | EXTENDED_RELEASE_TABLET | Freq: Once | ORAL | Status: AC
  Administered 2012-12-22: 40 meq via ORAL
  Filled 2012-12-22: qty 2

## 2012-12-22 NOTE — Progress Notes (Signed)
ANTICOAGULATION CONSULT NOTE  Pharmacy Consult for Heparin Indication: chest pain/ACS  No Known Allergies  Patient Measurements: Ht: 69 in Wt: 91 kg IBW: 71 kg  Vital Signs: Temp: 98.1 F (36.7 C) (05/04 0400) Temp src: Oral (05/04 0400) BP: 128/64 mmHg (05/04 0600) Pulse Rate: 73 (05/04 0600)  Labs:  Recent Labs  12/19/12 1632 12/19/12 2050  12/20/12 0330  12/21/12 0032 12/21/12 1020 12/21/12 2005 12/22/12 0420  HGB  --   --   < > 11.6*  --  11.2*  --   --  11.1*  HCT  --   --   --  33.5*  --  32.2*  --   --  32.3*  PLT  --   --   --  215  --  229  --   --  278  HEPARINUNFRC  --   --   --  0.19*  < >  --  0.23* 0.23* 0.39  CREATININE  --   --   --  1.55*  --  1.46*  --   --  1.50*  TROPONINI 8.56* 9.67*  --  10.59*  --   --   --   --   --   < > = values in this interval not displayed.  Estimated Creatinine Clearance: 42.5 ml/min (by C-G formula based on Cr of 1.5).  Assessment: 79 year old male with CAD s/p cath, awaiting PCI of LAD when medically able. Heparin level is finally at goal on 2000 units/hr. No bleeding issues noted, cbc has been stable overnight.  Goal of Therapy:  Heparin level 0.3-0.7 units/ml Monitor platelets by anticoagulation protocol: Yes   Plan:  -Continue heparin at 2000 units/hr -Heparin level/cbc daily -Cath/pci when stable  Sheppard Coil PharmD., BCPS Clinical Pharmacist Pager [phone] 12/22/2012 7:23 AM

## 2012-12-22 NOTE — Progress Notes (Signed)
Subjective: No CP  Breathing is a little better. Objective: Filed Vitals:   12/22/12 0700 12/22/12 0716 12/22/12 0751 12/22/12 0800  BP: 118/63   124/60  Pulse: 78   58  Temp:   98.3 F (36.8 C)   TempSrc:   Oral   Resp: 26   20  Height:      Weight:      SpO2: 94% 94%  95%   Weight change: 1 lb 2.6 oz (0.527 kg)  Intake/Output Summary (Last 24 hours) at 12/22/12 1005 Last data filed at 12/22/12 0900  Gross per 24 hour  Intake 1689.5 ml  Output   2700 ml  Net -1010.5 ml    General: Alert, awake, oriented x3, in no acute distress Neck:  JVP is mildly increased. Heart: Regular rate and rhythm, without murmurs, rubs, gallops.  Lungs: Clear to auscultation.  Rales at R base.  Mild wheezes  Diffuse (patient using NRB mask) Exemities:  No edema.   Neuro: Grossly intact, nonfocal.  Tele  SR with PVCs  Lab Results: Results for orders placed during the hospital encounter of 12/19/12 (from the past 24 hour(s))  HEPARIN LEVEL (UNFRACTIONATED)     Status: Abnormal   Collection Time    12/21/12 10:20 AM      Result Value Range   Heparin Unfractionated 0.23 (*) 0.30 - 0.70 IU/mL  HEPARIN LEVEL (UNFRACTIONATED)     Status: Abnormal   Collection Time    12/21/12  8:05 PM      Result Value Range   Heparin Unfractionated 0.23 (*) 0.30 - 0.70 IU/mL  CBC     Status: Abnormal   Collection Time    12/22/12  4:20 AM      Result Value Range   WBC 9.2  4.0 - 10.5 K/uL   RBC 3.85 (*) 4.22 - 5.81 MIL/uL   Hemoglobin 11.1 (*) 13.0 - 17.0 g/dL   HCT 16.1 (*) 09.6 - 04.5 %   MCV 83.9  78.0 - 100.0 fL   MCH 28.8  26.0 - 34.0 pg   MCHC 34.4  30.0 - 36.0 g/dL   RDW 40.9  81.1 - 91.4 %   Platelets 278  150 - 400 K/uL  BASIC METABOLIC PANEL     Status: Abnormal   Collection Time    12/22/12  4:20 AM      Result Value Range   Sodium 138  135 - 145 mEq/L   Potassium 3.1 (*) 3.5 - 5.1 mEq/L   Chloride 99  96 - 112 mEq/L   CO2 26  19 - 32 mEq/L   Glucose, Bld 117 (*) 70 - 99 mg/dL   BUN  39 (*) 6 - 23 mg/dL   Creatinine, Ser 7.82 (*) 0.50 - 1.35 mg/dL   Calcium 8.3 (*) 8.4 - 10.5 mg/dL   GFR calc non Af Amer 42 (*) >90 mL/min   GFR calc Af Amer 48 (*) >90 mL/min  HEPARIN LEVEL (UNFRACTIONATED)     Status: None   Collection Time    12/22/12  4:20 AM      Result Value Range   Heparin Unfractionated 0.39  0.30 - 0.70 IU/mL    Studies/Results: @RISRSLT24 @  Medications:  Reviewed   @PROBHOSP @  NSTEMI: 2 vessel CAD as above.   WIll recheck troponin/CK/MBPlan is PCI of LAD once he is medically able for another procedure. On Plavix and ASA. Continue heparin Switch to NTG paste.   CAD: As above  CKD: BUN  is increased  Cr rel stable.  Remains on 100 IV lasix BID.  WIll try DA 2 mcg/kg/min. ISCHEMIC CARDIOMYOPATHY: EF is severely reduced (25%).  Will check BNP  Volume still appears increased.  Not great response to IV lasix   UTI: Day number 3 of Cipro. Continue. His UTI persisted despite Bactrim. He will need a follow up UA after completing antibiotics.       LOS: 3 days   Dietrich Pates 12/22/2012, 10:05 AM

## 2012-12-22 NOTE — Progress Notes (Signed)
Respiratory therapy note- Patient breifly came off Bipap for break and post PIC line. Tolerated for 20 min. sp02 77%, recovered now sp02 94% with bipap and fio2 increased to 80%. Co-ox ordered.

## 2012-12-22 NOTE — Progress Notes (Signed)
Pt became very short and labored for breath, along with frequent PVCs, BP increased 160/77, HR 80-90, sats 80s on 100%NRB  and diaphoretic. MD called to make aware, orders given to DC dopamine drip, do stat portable chest X-ray, and place pt on bipap. Once on Bipap pt work of breathing decreases and his sats increased to the mid to upper 90s. Will cont. To monitor pt.

## 2012-12-22 NOTE — Progress Notes (Signed)
Respiratory therapy note-Patient on 100% NRB, sp02 in the low 90's through out the day. Onset of shortness of breath very quick with sp02 88%, pt. Very  Restless, diaphoretic, MD called by RN. Patient placed on Bipap 12/6 100%, now sp02 97%, patient is very comfortable. Will titrate fio2 as tolerated.

## 2012-12-22 NOTE — Progress Notes (Signed)
Peripherally Inserted Central Catheter/Midline Placement  The IV Nurse has discussed with the patient and/or persons authorized to consent for the patient, the purpose of this procedure and the potential benefits and risks involved with this procedure.  The benefits include less needle sticks, lab draws from the catheter and patient may be discharged home with the catheter.  Risks include, but not limited to, infection, bleeding, blood clot (thrombus formation), and puncture of an artery; nerve damage and irregular heat beat.  Alternatives to this procedure were also discussed.  PICC/Midline Placement Documentation  PICC Triple Lumen 12/22/12 PICC Left Cephalic (Active)       Christopher [name] 12/22/2012, 5:11 PM

## 2012-12-23 ENCOUNTER — Inpatient Hospital Stay (HOSPITAL_COMMUNITY): Payer: Medicare Other

## 2012-12-23 LAB — CBC
Hemoglobin: 11.6 g/dL — ABNORMAL LOW (ref 13.0–17.0)
Platelets: 295 10*3/uL (ref 150–400)
RBC: 4.06 MIL/uL — ABNORMAL LOW (ref 4.22–5.81)
WBC: 9.3 10*3/uL (ref 4.0–10.5)

## 2012-12-23 LAB — BASIC METABOLIC PANEL
BUN: 26 mg/dL — ABNORMAL HIGH (ref 6–23)
CO2: 29 mEq/L (ref 19–32)
Chloride: 102 mEq/L (ref 96–112)
Chloride: 102 mEq/L (ref 96–112)
Creatinine, Ser: 0.98 mg/dL (ref 0.50–1.35)
GFR calc Af Amer: 86 mL/min — ABNORMAL LOW (ref >90)
Glucose, Bld: 109 mg/dL — ABNORMAL HIGH (ref 70–99)
Glucose, Bld: 155 mg/dL — ABNORMAL HIGH (ref 70–99)
Potassium: 3.4 mEq/L — ABNORMAL LOW (ref 3.5–5.1)
Potassium: 3.2 mEq/L — ABNORMAL LOW (ref 3.5–5.1)
Sodium: 143 mEq/L (ref 135–145)

## 2012-12-23 LAB — CARBOXYHEMOGLOBIN
Carboxyhemoglobin: 1.5 % (ref 0.5–1.5)
Methemoglobin: 1.2 % (ref 0.0–1.5)
Total hemoglobin: 11.8 g/dL — ABNORMAL LOW (ref 13.5–18.0)

## 2012-12-23 LAB — HEPARIN LEVEL (UNFRACTIONATED): Heparin Unfractionated: 0.29 IU/mL — ABNORMAL LOW (ref 0.30–0.70)

## 2012-12-23 MED ORDER — POTASSIUM CHLORIDE CRYS ER 20 MEQ PO TBCR: 60.0000 meq | EXTENDED_RELEASE_TABLET | Freq: Once | ORAL | Status: DC

## 2012-12-23 MED ORDER — POTASSIUM CHLORIDE CRYS ER 20 MEQ PO TBCR
40.0000 meq | EXTENDED_RELEASE_TABLET | Freq: Two times a day (BID) | ORAL | Status: DC
  Administered 2012-12-23 – 2012-12-26 (×7): 40 meq via ORAL
  Filled 2012-12-23 (×12): qty 2

## 2012-12-23 MED ORDER — POTASSIUM CHLORIDE CRYS ER 20 MEQ PO TBCR
40.0000 meq | EXTENDED_RELEASE_TABLET | Freq: Once | ORAL | Status: AC
  Administered 2012-12-23: 40 meq via ORAL
  Filled 2012-12-23: qty 2

## 2012-12-23 MED ORDER — POTASSIUM CHLORIDE CRYS ER 20 MEQ PO TBCR: 60.0000 meq | EXTENDED_RELEASE_TABLET | Freq: Two times a day (BID) | ORAL | Status: DC

## 2012-12-23 MED ORDER — POTASSIUM CHLORIDE CRYS ER 20 MEQ PO TBCR
40.0000 meq | EXTENDED_RELEASE_TABLET | Freq: Once | ORAL | Status: AC
  Administered 2012-12-23: 40 meq via ORAL

## 2012-12-23 NOTE — Progress Notes (Signed)
MD Tenny Craw updated about patient UOP and overall condition. BMET from evening lab draw 3.0. 40 Meq K to be given PO once via MD order. Patient UOP has increased from 75/hr to 100-175/hr, VS remain stable. NSR 70-80's. SBP 110-120's/50-60's. Will continue to monitor the patient closely.

## 2012-12-23 NOTE — Progress Notes (Addendum)
Subjective: Patient denies CP  Breathing is better Objective: Filed Vitals:   12/23/12 0600 12/23/12 0700 12/23/12 0722 12/23/12 0725  BP: 135/62 130/63  130/63  Pulse: 70 79  75  Temp:      TempSrc:      Resp: 18 21  25   Height:      Weight: 191 lb 5.8 oz (86.8 kg)     SpO2: 99% 99% 96% 96%   Weight change: -10 lb 12.8 oz (-4.9 kg)  Intake/Output Summary (Last 24 hours) at 12/23/12 0749 Last data filed at 12/23/12 0600  Gross per 24 hour  Intake 1221.6 ml  Output   6025 ml  Net -4803.4 ml    General: Alert, awake, oriented x3, in no acute distress Neck:  JVP is normal Heart: Regular rate and rhythm, without murmurs, rubs, gallops.  Lungs: Clear to auscultation.  No rales or wheezes. Exemities:  No edema.   Neuro: Grossly intact, nonfocal.  Tele:  SR 70s   Lab Results: Results for orders placed during the hospital encounter of 12/19/12 (from the past 24 hour(s))  PRO B NATRIURETIC PEPTIDE     Status: Abnormal   Collection Time    12/22/12 11:30 AM      Result Value Range   Pro B Natriuretic peptide (BNP) 7018.0 (*) 0 - 450 pg/mL  BASIC METABOLIC PANEL     Status: Abnormal   Collection Time    12/22/12 11:30 AM      Result Value Range   Sodium 138  135 - 145 mEq/L   Potassium 3.3 (*) 3.5 - 5.1 mEq/L   Chloride 98  96 - 112 mEq/L   CO2 27  19 - 32 mEq/L   Glucose, Bld 166 (*) 70 - 99 mg/dL   BUN 37 (*) 6 - 23 mg/dL   Creatinine, Ser 4.09 (*) 0.50 - 1.35 mg/dL   Calcium 8.7  8.4 - 81.1 mg/dL   GFR calc non Af Amer 47 (*) >90 mL/min   GFR calc Af Amer 54 (*) >90 mL/min  POCT I-STAT 3, BLOOD GAS (G3+)     Status: Abnormal   Collection Time    12/22/12  6:14 PM      Result Value Range   pH, Arterial 7.470 (*) 7.350 - 7.450   pCO2 arterial 34.2 (*) 35.0 - 45.0 mmHg   pO2, Arterial 74.0 (*) 80.0 - 100.0 mmHg   Bicarbonate 24.9 (*) 20.0 - 24.0 mEq/L   TCO2 26  0 - 100 mmol/L   O2 Saturation 96.0     Acid-Base Excess 2.0  0.0 - 2.0 mmol/L   Collection site  RADIAL, ALLEN'S TEST ACCEPTABLE     Sample type ARTERIAL    CARBOXYHEMOGLOBIN     Status: Abnormal   Collection Time    12/22/12  6:30 PM      Result Value Range   Total hemoglobin 12.4 (*) 13.5 - 18.0 g/dL   O2 Saturation 91.4     Carboxyhemoglobin 0.8  0.5 - 1.5 %   Methemoglobin 0.8  0.0 - 1.5 %  BASIC METABOLIC PANEL     Status: Abnormal   Collection Time    12/22/12 10:59 PM      Result Value Range   Sodium 141  135 - 145 mEq/L   Potassium 3.0 (*) 3.5 - 5.1 mEq/L   Chloride 101  96 - 112 mEq/L   CO2 29  19 - 32 mEq/L   Glucose, Bld 121 (*) 70 -  99 mg/dL   BUN 32 (*) 6 - 23 mg/dL   Creatinine, Ser 1.61  0.50 - 1.35 mg/dL   Calcium 8.4  8.4 - 09.6 mg/dL   GFR calc non Af Amer 55 (*) >90 mL/min   GFR calc Af Amer 64 (*) >90 mL/min    Studies/Results: @RISRSLT24 @  Medications:  Reviewed   @PROBHOSP @  1.  Acute on chronic systolic CHF  Patient has responded to milrinone and lasix gtt.  Still with volume increase I would keep on current regimen today and continue to follow volume/response  2.  CAD  Patient remains pain free  Plan for intervention on LAD once volume and renal status are stabilized  3.  Chronic Renal insufficiency  Improved function with improved cardiac output/renal perfusion.  4.  UTI  Day 4 of Cipro.  WIll need f/u UA after completes since he had persistent UTI after Bactrim.  LOS: 4 days   Dietrich Pates 12/23/2012, 7:49 AM

## 2012-12-23 NOTE — Progress Notes (Signed)
ANTICOAGULATION CONSULT NOTE - Follow Up Consult  Pharmacy Consult for Heparin Indication: chest pain/ACS  No Known Allergies  Patient Measurements: Height: 5\' 9"  (175.3 cm) Weight: 191 lb 5.8 oz (86.8 kg) IBW/kg (Calculated) : 70.7 Heparin Dosing Weight: 86.8 kg  Vital Signs: Temp: 98.1 F (36.7 C) (05/05 1152) Temp src: Axillary (05/05 1152) BP: 132/66 mmHg (05/05 1700) Pulse Rate: 82 (05/05 1700)  Labs:  Recent Labs  12/21/12 0032  12/22/12 0420  12/22/12 2259 12/23/12 0718 12/23/12 1635 12/23/12 1641  HGB 11.2*  --  11.1*  --   --  11.6*  --   --   HCT 32.2*  --  32.3*  --   --  34.4*  --   --   PLT 229  --  278  --   --  295  --   --   HEPARINUNFRC  --   < > 0.39  --   --  0.29*  --  0.20*  CREATININE 1.46*  --  1.50*  < > 1.19 1.09 0.98  --   < > = values in this interval not displayed.  Estimated Creatinine Clearance: 63.4 ml/min (by C-G formula based on Cr of 0.98).   Medications:  Heparin @ 2150 units/hr (20 ml/hr)  Assessment: 79 y.o. M with CAD s/p cath, awaiting PCI of LAD when medically able. Heparin level is still SUBtherapeutic (HL 0.2, goal of 0.3-0.7) after rate increase this morning. No s/sx of bleeding, no interruption with infusion per RN.   Goal of Therapy:  Heparin level 0.3-0.7 units/ml Monitor platelets by anticoagulation protocol: Yes   Plan:  1. Increase heparin drip to 2300 units/hr (23 ml/hr) 2. Will continue to monitor for any signs/symptoms of bleeding and will follow up with heparin level in 8 hours   Bayard Hugger, PharmD, BCPS  Clinical Pharmacist  Pager: [phone]  12/23/2012 5:51 PM

## 2012-12-23 NOTE — Progress Notes (Signed)
RT Note: Pt tolerating well on 5L [place] at this time. No distress noted pt states his breathing feels okay. Bipap at bedside on standby Rt will continue to monitor

## 2012-12-23 NOTE — Progress Notes (Signed)
RN removed pt from bipap and placed on [place].

## 2012-12-23 NOTE — Progress Notes (Signed)
ANTICOAGULATION CONSULT NOTE - Follow Up Consult  Pharmacy Consult for Heparin Indication: chest pain/ACS  No Known Allergies  Patient Measurements: Height: 5\' 9"  (175.3 cm) Weight: 191 lb 5.8 oz (86.8 kg) IBW/kg (Calculated) : 70.7 Heparin Dosing Weight: 86.8 kg  Vital Signs: Temp: 98.2 F (36.8 C) (05/05 0400) Temp src: Oral (05/05 0400) BP: 136/63 mmHg (05/05 0800) Pulse Rate: 73 (05/05 0800)  Labs:  Recent Labs  12/21/12 0032  12/21/12 2005 12/22/12 0420 12/22/12 1130 12/22/12 2259 12/23/12 0718  HGB 11.2*  --   --  11.1*  --   --  11.6*  HCT 32.2*  --   --  32.3*  --   --  34.4*  PLT 229  --   --  278  --   --  295  HEPARINUNFRC  --   < > 0.23* 0.39  --   --  0.29*  CREATININE 1.46*  --   --  1.50* 1.36* 1.19 1.09  < > = values in this interval not displayed.  Estimated Creatinine Clearance: 57 ml/min (by C-G formula based on Cr of 1.09).   Medications:  Heparin @ 2000 units/hr (20 ml/hr)  Assessment: 79 y.o. M with CAD s/p cath, awaiting PCI of LAD when medically able. Heparin level this a.m is slightly SUBtherapeutic (HL 0.29, goal of 0.3-0.7). Hgb/Hct/Plt stable. No s/sx of bleeding noted.   Goal of Therapy:  Heparin level 0.3-0.7 units/ml Monitor platelets by anticoagulation protocol: Yes   Plan:  1. Increase heparin drip to 2150 units/hr (21.5 ml/hr) 2. Will continue to monitor for any signs/symptoms of bleeding and will follow up with heparin level in 8 hours   Georgina Pillion, PharmD, BCPS Clinical Pharmacist Pager: [phone] 12/23/2012 8:12 AM

## 2012-12-24 DIAGNOSIS — N189 Chronic kidney disease, unspecified: Secondary | ICD-10-CM

## 2012-12-24 DIAGNOSIS — N390 Urinary tract infection, site not specified: Secondary | ICD-10-CM

## 2012-12-24 LAB — URINALYSIS, ROUTINE W REFLEX MICROSCOPIC
Nitrite: POSITIVE — AB
Specific Gravity, Urine: 1.01 (ref 1.005–1.030)
Urobilinogen, UA: 4 mg/dL — ABNORMAL HIGH (ref 0.0–1.0)
pH: 6.5 (ref 5.0–8.0)

## 2012-12-24 LAB — BASIC METABOLIC PANEL
BUN: 25 mg/dL — ABNORMAL HIGH (ref 6–23)
Chloride: 102 mEq/L (ref 96–112)
Creatinine, Ser: 0.97 mg/dL (ref 0.50–1.35)
Glucose, Bld: 119 mg/dL — ABNORMAL HIGH (ref 70–99)
Potassium: 3.6 mEq/L (ref 3.5–5.1)

## 2012-12-24 LAB — CBC
HCT: 34.8 % — ABNORMAL LOW (ref 39.0–52.0)
Hemoglobin: 11.9 g/dL — ABNORMAL LOW (ref 13.0–17.0)
MCHC: 34.2 g/dL (ref 30.0–36.0)
MCV: 84.7 fL (ref 78.0–100.0)
RDW: 14.8 % (ref 11.5–15.5)
WBC: 10.5 10*3/uL (ref 4.0–10.5)

## 2012-12-24 LAB — CARBOXYHEMOGLOBIN
Carboxyhemoglobin: 0.9 % (ref 0.5–1.5)
Methemoglobin: 0.8 % (ref 0.0–1.5)

## 2012-12-24 LAB — HEPARIN LEVEL (UNFRACTIONATED)
Heparin Unfractionated: 0.42 IU/mL (ref 0.30–0.70)
Heparin Unfractionated: 0.28 IU/mL — ABNORMAL LOW (ref 0.30–0.70)

## 2012-12-24 LAB — URINE MICROSCOPIC-ADD ON

## 2012-12-24 MED ORDER — SODIUM CHLORIDE 0.9 % IV SOLN
INTRAVENOUS | Status: DC
  Administered 2012-12-25: 06:00:00 via INTRAVENOUS

## 2012-12-24 MED ORDER — HEPARIN SODIUM (PORCINE) 5000 UNIT/ML IJ SOLN
5000.0000 [IU] | Freq: Three times a day (TID) | INTRAMUSCULAR | Status: DC
  Administered 2012-12-24 – 2012-12-25 (×2): 5000 [IU] via SUBCUTANEOUS
  Filled 2012-12-24 (×5): qty 1

## 2012-12-24 MED ORDER — ASPIRIN 81 MG PO CHEW
324.0000 mg | CHEWABLE_TABLET | ORAL | Status: AC
  Administered 2012-12-25: 324 mg via ORAL
  Filled 2012-12-24: qty 4

## 2012-12-24 MED ORDER — CIPROFLOXACIN HCL 500 MG PO TABS
500.0000 mg | ORAL_TABLET | Freq: Two times a day (BID) | ORAL | Status: DC
  Administered 2012-12-24 – 2012-12-30 (×12): 500 mg via ORAL
  Filled 2012-12-24 (×16): qty 1

## 2012-12-24 MED ORDER — SODIUM CHLORIDE 0.9 % IJ SOLN: 3.0000 mL | Freq: Two times a day (BID) | INTRAMUSCULAR | Status: DC

## 2012-12-24 MED ORDER — SODIUM CHLORIDE 0.9 % IJ SOLN: 3.0000 mL | INTRAMUSCULAR | Status: DC | PRN

## 2012-12-24 MED ORDER — SODIUM CHLORIDE 0.9 % IV SOLN: 250.0000 mL | INTRAVENOUS | Status: DC | PRN

## 2012-12-24 MED ORDER — ENALAPRIL MALEATE 2.5 MG PO TABS
2.5000 mg | ORAL_TABLET | Freq: Two times a day (BID) | ORAL | Status: DC
  Administered 2012-12-24 – 2012-12-26 (×5): 2.5 mg via ORAL
  Filled 2012-12-24 (×8): qty 1

## 2012-12-24 MED ORDER — POTASSIUM CHLORIDE CRYS ER 20 MEQ PO TBCR
40.0000 meq | EXTENDED_RELEASE_TABLET | Freq: Once | ORAL | Status: AC
  Administered 2012-12-24: 40 meq via ORAL
  Filled 2012-12-24: qty 4

## 2012-12-24 NOTE — Progress Notes (Signed)
Pt t/x to [id number] in wheelchair on monitor without event. Pt's nephew called to make aware of t/x. Rosemary given bedside report and pt was hooked up to telemetry before my departure.   Christopher [name]

## 2012-12-24 NOTE — Clinical Documentation Improvement (Signed)
CKD DOCUMENTATION CLARIFICATION QUERY   THIS DOCUMENT IS NOT A PERMANENT PART OF THE MEDICAL RECORD  TO RESPOND TO THE THIS QUERY, FOLLOW THE INSTRUCTIONS BELOW:  1. If needed, update documentation for the patient's encounter via the notes activity.  2. Access this query again and click edit on the In [employer].  3. After updating, or not, click F2 to complete all highlighted (required) fields concerning your review. Select "additional documentation in the medical record" OR "no additional documentation provided".  4. Click Sign note button.  5. The deficiency will fall out of your In Basket *Please let us know if you are not able to complete this workflow by phone or e-mail (listed below).  Please update your documentation within the medical record to reflect your response to this query.                                                                                        12/24/12   Dear Dr. Ross:/Associates,  In a better effort to capture your patient's severity of illness, reflect appropriate length of stay and utilization of resources, a review of the patient medical record has revealed the following indicators.    Based on your clinical judgment, please clarify and document in a progress note and/or discharge summary the clinical condition associated with the following supporting information:  In responding to this query please exercise your independent judgment.  The fact that a query is asked, does not imply that any particular answer is desired or expected.    Possible Clinical Conditions?   _______CKD Stage I -  GFR > OR = 90  _______CKD Stage II - GFR 60-80  _______CKD Stage III - GFR 30-59  _______Acute Kidney Injury  _______Acute Renal Failure  _______Other condition  _______Cannot Clinically determine      Supporting Information:  Risk Factors: Renal Insufficiency noted per 5/01 progress notes. CKD noted per 5/03 and 5/04 progress  notes. Renal-improved function with improved perfusion noted per 5/05 progress notes.     Lab:   Bun:  5/03:   34 5/04:   39 5/06:   25  Creat: 5/03:   1.46 5/04:   1.50 5/06:   0.97  GFR:  5/03: non Af.Am: 43;  Af.Am: 50. 5/04: non Af.Am: 42;  Af.Am: 50. 5/06: non Af.Am: 75;  Af.Am: 87.     You may use possible, probable, or suspect with inpatient documentation. possible, probable, suspected diagnoses MUST be documented at the time of discharge  Reviewed: Chronic renal failure documented.  Thank You,  Marciano Sequin,  Clinical Documentation Specialist:  Phone: [phone]  Health Information Management [place]

## 2012-12-24 NOTE — Progress Notes (Signed)
Advanced Heart Failure Rounding Note   Subjective:   79 year old white male transferred from [facility] on 12/20/12 for evaluation of a non-ST elevation myocardial infarction and new onset congestive heart failure.  LHC 12/20/12 1. Severe two-vessel obstructive coronary artery disease. The right coronary is occluded with excellent collateral flow. There is a high-grade stenosis in the LAD immediately following the first diagonal.  2. Elevated left ventricular filling pressures  PCI of LAD deferred due to high EDP.   Initially diuresed with intermittent IV lasix but later started on lasix drip. Remains on Lasix drip 8 mg per hour. Milrinone started 0.125 mcg.  12/22/12 for is sluggish diuresis and increased renal function. Renal function improved. Over night weight down 11 pounds. Denies SOB/PND/Orthopnea/CP  CVP 1-2 personally checked  Echo EF 25-30%  Objective:   Weight Range:  Vital Signs:   Temp:  [97.8 F (36.6 C)-99 F (37.2 C)] 97.8 F (36.6 C) (05/06 0805) Pulse Rate:  [60-93] 69 (05/06 0812) Resp:  [16-27] 19 (05/06 0812) BP: (113-144)/(46-68) 121/64 mmHg (05/06 0800) SpO2:  [87 %-99 %] 93 % (05/06 0812) FiO2 (%):  [60 %-100 %] 100 % (05/06 0801) Weight:  [180 lb 5.4 oz (81.8 kg)] 180 lb 5.4 oz (81.8 kg) (05/06 0500) Last BM Date: 12/20/12  Weight change: Filed Weights   12/22/12 0600 12/23/12 0600 12/24/12 0500  Weight: 202 lb 2.6 oz (91.7 kg) 191 lb 5.8 oz (86.8 kg) 180 lb 5.4 oz (81.8 kg)    Intake/Output:   Intake/Output Summary (Last 24 hours) at 12/24/12 0831 Last data filed at 12/24/12 0700  Gross per 24 hour  Intake 1817.3 ml  Output   4525 ml  Net -2707.7 ml     Physical Exam: CVP 1-2 General:  Elderly chronically ill appearing. No resp difficulty HEENT: normal Neck: supple. JVP flat. Carotids 2+ bilat; no bruits. No lymphadenopathy or thryomegaly appreciated. Cor: PMI nondisplaced. Regular rate & rhythm. No rubs, gallops or murmurs. Lungs:  clear Abdomen: soft, nontender, nondistended. No hepatosplenomegaly. No bruits or masses. Good bowel sounds.  Extremities: no cyanosis, clubbing, rash, edema. Foley in place. Neuro: alert & orientedx3, cranial nerves grossly intact. moves all 4 extremities w/o difficulty. Affect pleasant  Telemetry:  SR 76 pbm  Labs: Basic Metabolic Panel:  Recent Labs Lab 12/22/12 1130 12/22/12 2259 12/23/12 0500 12/23/12 0718 12/23/12 1635 12/24/12 0426  NA 138 141  --  143 142 142  K 3.3* 3.0*  --  3.4* 3.2* 3.6  CL 98 101  --  102 102 102  CO2 27 29  --  29 32 27  GLUCOSE 166* 121*  --  109* 155* 119*  BUN 37* 32*  --  29* 26* 25*  CREATININE 1.36* 1.19  --  1.09 0.98 0.97  CALCIUM 8.7 8.4  --  8.7 8.6 8.5  MG  --   --  2.2  --   --   --     Liver Function Tests:  Recent Labs Lab 12/20/12 0330  AST 47*  ALT 18  ALKPHOS 46  BILITOT 0.5  PROT 6.4  ALBUMIN 2.5*   No results found for this basename: LIPASE, AMYLASE,  in the last 168 hours No results found for this basename: AMMONIA,  in the last 168 hours  CBC:  Recent Labs Lab 12/20/12 0330 12/21/12 0032 12/22/12 0420 12/23/12 0718 12/24/12 0426  WBC 11.5* 10.5 9.2 9.3 10.5  HGB 11.6* 11.2* 11.1* 11.6* 11.9*  HCT 33.5* 32.2* 32.3* 34.4*  34.8*  MCV 84.6 83.6 83.9 84.7 84.7  PLT 215 229 278 295 324    Cardiac Enzymes:  Recent Labs Lab 12/19/12 1632 12/19/12 2050 12/20/12 0330  TROPONINI 8.56* 9.67* 10.59*    BNP: BNP (last 3 results)  Recent Labs  12/19/12 1632 12/22/12 1130 12/23/12 0500  PROBNP 8646.0* 7018.0* 5717.0*     Other results:   Imaging: Dg Chest Port 1 View  12/23/2012  *RADIOLOGY REPORT*  Clinical Data: Respiratory distress  PORTABLE CHEST - 1 VIEW  Comparison: Yesterday  Findings: Stable left PICC.  Diffuse consolidation improved. Cardiomegaly stable.  No pneumothorax.  IMPRESSION: Improved airspace disease.   Original Report Authenticated By: Jolaine Click, M.D.    Dg Chest Port 1  View  12/22/2012  *RADIOLOGY REPORT*  Clinical Data: Respiratory distress  PORTABLE CHEST - 1 VIEW  Comparison: 12/21/2012  Findings: No significant change in diffuse patchy airspace process throughout both lungs.  This obscures the cardiac borders and hemidiaphragms.  No enlarging effusion or pneumothorax.  Trachea midline.  No new collapse.  IMPRESSION: Stable extensive airspace process.  No interval change.   Original Report Authenticated By: Judie Petit. Miles Costain, M.D.       Medications:     Scheduled Medications: . albuterol  2.5 mg Nebulization Q6H  . aspirin EC  81 mg Oral Daily  . atorvastatin  80 mg Oral q1800  . carvedilol  3.125 mg Oral BID WC  . ciprofloxacin  250 mg Oral BID  . clopidogrel  75 mg Oral Q breakfast  . nitroGLYCERIN  1 inch Topical Custom  . potassium chloride  40 mEq Oral BID  . sodium chloride  10-40 mL Intracatheter Q12H  . sodium chloride  3 mL Intravenous Q12H     Infusions: . sodium chloride 10 mL/hr at 12/22/12 0700  . furosemide (LASIX) infusion 8 mg/hr (12/24/12 0814)  . heparin 2,300 Units/hr (12/23/12 2100)  . milrinone 0.124 mcg/kg/min (12/23/12 2100)     PRN Medications:  sodium chloride, acetaminophen, albuterol, ondansetron (ZOFRAN) IV, sodium chloride, sodium chloride   Assessment:  1. NSTEMI - 2 vessel CAD 2. ICM- EF 25%  3. Chronic Renal Failure 4. UTI 5.   Plan/Discussion:   Volume status improving. Overall weight down 22 pounds.  CVP 1. Stop lasix drip. Check CO-OX.  Continue Milrinone at 0.125 mcg. Renal function stable.  Add enalapril 2.5 mg twice a day. Supplement potassium.   Plan for PCI of LAD once medically stable. On plavix and ASA  Check UA/culture. Continue cipro  Length of Stay: 5   CLEGG,AMY NP-C 12/24/2012, 8:31 AM  Advanced Heart Failure Team Pager [phone] (M-F; 7a - 4p)  Please contact [facility] for night-coverage after hours (4p -7a ) and weekends on [email or web address]  Patient seen and examined with Tonye Becket, NP. We discussed all aspects of the encounter. I agree with the assessment and plan as stated above.   He is much improved. Volume status looks great. Renal function improved. Co-ox good. Will stop lasix and milrinone. Get Foley out. Get to chair and have cardiac rehab see. Change heparin to DVT dosing. Treat UTI. Watch for urinary retention. Agree with enalapril and carvedilol.   Will plan PCI LAD tomorrow.   Check co-ox in am.   Truman Hayward 2:47 PM

## 2012-12-24 NOTE — Progress Notes (Addendum)
ANTICOAGULATION CONSULT NOTE - Follow Up Consult  Pharmacy Consult for Heparin Indication: chest pain/ACS  No Known Allergies  Patient Measurements: Height: 5\' 9"  (175.3 cm) Weight: 180 lb 5.4 oz (81.8 kg) IBW/kg (Calculated) : 70.7 Heparin Dosing Weight: 86.8 kg  Vital Signs: Temp: 97.8 F (36.6 C) (05/06 0805) Temp src: Oral (05/06 0805) BP: 129/58 mmHg (05/06 0700) Pulse Rate: 65 (05/06 0700)  Labs:  Recent Labs  12/22/12 0420  12/23/12 0718 12/23/12 1635 12/23/12 1641 12/24/12 0426  HGB 11.1*  --  11.6*  --   --  11.9*  HCT 32.3*  --  34.4*  --   --  34.8*  PLT 278  --  295  --   --  324  HEPARINUNFRC 0.39  --  0.29*  --  0.20* 0.42  CREATININE 1.50*  < > 1.09 0.98  --  0.97  < > = values in this interval not displayed.  Estimated Creatinine Clearance: 58.7 ml/min (by C-G formula based on Cr of 0.97).   Medications:  . sodium chloride 10 mL/hr at 12/22/12 0700  . furosemide (LASIX) infusion 8 mg/hr (12/23/12 2100)  . heparin 2,300 Units/hr (12/23/12 2100)  . milrinone 0.124 mcg/kg/min (12/23/12 2100)   Assessment: 79 y.o. M with CAD s/p cath, awaiting PCI of LAD when medically able. Heparin level this morning is at goal (0.42). Hgb/Hct/Plt stable. No s/sx of bleeding noted.   Just spoke with bedside RN who told me she recently turned heparin drip off due to blood tinged urine. H/H are stable. Asked RN to call MD about heparin being off - will f/u with her in a bit.  Goal of Therapy:  Heparin level 0.3-0.7 units/ml Monitor platelets by anticoagulation protocol: Yes   Plan:  -Continue heparin drip at 2300 units/hr (23 ml/hr) -Confirmatory heparin level at 09:00 -Daily heparin level and CBC while on heparin -Will continue to monitor for any signs/symptoms of bleeding  [facility], Calvin.D., BCPS Clinical Pharmacist Pager: [phone] 12/24/2012 8:07 AM   Addendum: Confirmatory heparin level 0.28 and slightly below goal. No additional bleeding noted  in chart.  Increase heparin to 2400 units/hr. Check heparin level in 6 hrs.  Carmen, [address].D., BCPS Clinical Pharmacist Pager: [phone] 12/24/2012 11:58 AM

## 2012-12-25 ENCOUNTER — Encounter (HOSPITAL_COMMUNITY)
Admission: AD | Disposition: A | Discharge status: Home Health | Payer: Self-pay | Source: Other Acute Inpatient Hospital | Attending: Cardiology

## 2012-12-25 DIAGNOSIS — I214 Non-ST elevation (NSTEMI) myocardial infarction: Secondary | ICD-10-CM

## 2012-12-25 DIAGNOSIS — I2510 Atherosclerotic heart disease of native coronary artery without angina pectoris: Secondary | ICD-10-CM

## 2012-12-25 HISTORY — PX: PERCUTANEOUS CORONARY STENT INTERVENTION (PCI-S): SHX5485

## 2012-12-25 LAB — GLUCOSE, CAPILLARY: Glucose-Capillary: 118 mg/dL — ABNORMAL HIGH (ref 70–99)

## 2012-12-25 LAB — URINE CULTURE: Culture: NO GROWTH

## 2012-12-25 LAB — CBC
HCT: 36.1 % — ABNORMAL LOW (ref 39.0–52.0)
MCV: 84.5 fL (ref 78.0–100.0)
RBC: 4.27 MIL/uL (ref 4.22–5.81)
WBC: 11 10*3/uL — ABNORMAL HIGH (ref 4.0–10.5)

## 2012-12-25 LAB — BASIC METABOLIC PANEL
BUN: 24 mg/dL — ABNORMAL HIGH (ref 6–23)
CO2: 28 mEq/L (ref 19–32)
Chloride: 104 mEq/L (ref 96–112)
Creatinine, Ser: 0.92 mg/dL (ref 0.50–1.35)
Glucose, Bld: 102 mg/dL — ABNORMAL HIGH (ref 70–99)

## 2012-12-25 LAB — CARBOXYHEMOGLOBIN: Methemoglobin: 0.7 % (ref 0.0–1.5)

## 2012-12-25 LAB — POCT ACTIVATED CLOTTING TIME: Activated Clotting Time: 318 seconds

## 2012-12-25 SURGERY — PERCUTANEOUS CORONARY STENT INTERVENTION (PCI-S)
Anesthesia: LOCAL

## 2012-12-25 MED ORDER — BIVALIRUDIN 250 MG IV SOLR
INTRAVENOUS | Status: AC
  Filled 2012-12-25: qty 250

## 2012-12-25 MED ORDER — SODIUM CHLORIDE 0.9 % IV SOLN: INTRAVENOUS | Status: AC

## 2012-12-25 MED ORDER — SODIUM CHLORIDE 0.9 % IJ SOLN
INTRAMUSCULAR | Status: AC
  Administered 2012-12-25: 10 mL
  Filled 2012-12-25: qty 10

## 2012-12-25 MED ORDER — VERAPAMIL HCL 2.5 MG/ML IV SOLN
INTRAVENOUS | Status: AC
  Filled 2012-12-25: qty 2

## 2012-12-25 MED ORDER — HEART ATTACK BOUNCING BOOK
Freq: Once | Status: AC
  Administered 2012-12-25: 23:00:00
  Filled 2012-12-25: qty 1

## 2012-12-25 MED ORDER — LIDOCAINE HCL (PF) 1 % IJ SOLN
INTRAMUSCULAR | Status: AC
  Filled 2012-12-25: qty 30

## 2012-12-25 MED ORDER — MIDAZOLAM HCL 2 MG/2ML IJ SOLN
INTRAMUSCULAR | Status: AC
  Filled 2012-12-25: qty 2

## 2012-12-25 MED ORDER — HEPARIN (PORCINE) IN NACL 2-0.9 UNIT/ML-% IJ SOLN
INTRAMUSCULAR | Status: AC
  Filled 2012-12-25: qty 1000

## 2012-12-25 MED ORDER — SODIUM CHLORIDE 0.9 % IV SOLN
1.7500 mg/kg/h | INTRAVENOUS | Status: AC
  Administered 2012-12-25: 1.75 mg/kg/h via INTRAVENOUS
  Filled 2012-12-25: qty 250

## 2012-12-25 MED ORDER — NITROGLYCERIN 1 MG/10 ML FOR IR/CATH LAB
INTRA_ARTERIAL | Status: AC
  Filled 2012-12-25: qty 10

## 2012-12-25 MED ORDER — ATROPINE SULFATE 1 MG/ML IJ SOLN
INTRAMUSCULAR | Status: AC
  Filled 2012-12-25: qty 1

## 2012-12-25 MED ORDER — FUROSEMIDE 40 MG PO TABS
40.0000 mg | ORAL_TABLET | Freq: Every day | ORAL | Status: DC
  Administered 2012-12-25 – 2012-12-26 (×2): 40 mg via ORAL
  Filled 2012-12-25 (×4): qty 1

## 2012-12-25 NOTE — Interval H&P Note (Signed)
History and Physical Interval Note:  12/25/2012 8:36 AM  Christopher [name]  has presented today for PCI of LAD with the diagnosis of LAD DISEASE  The various methods of treatment have been discussed with the patient and family. After consideration of risks, benefits and other options for treatment, the patient has consented to  Procedure(s): PERCUTANEOUS CORONARY STENT INTERVENTION (PCI-S) (N/A) as a surgical intervention .  The patient's history has been reviewed, patient examined, no change in status, stable for surgery.  I have reviewed the patient's chart and labs.  Questions were answered to the patient's satisfaction.     Nalia Honeycutt

## 2012-12-25 NOTE — Progress Notes (Signed)
TR BAND REMOVAL  LOCATION:    right radial  DEFLATED PER PROTOCOL:    yes  TIME BAND OFF / DRESSING APPLIED:    1430   SITE UPON ARRIVAL:    Level 0  SITE AFTER BAND REMOVAL:    Level 0  REVERSE ALLEN'S TEST:     positive  CIRCULATION SENSATION AND MOVEMENT:    Within Normal Limits   yes  COMMENTS:  Tolerated procedure well 

## 2012-12-25 NOTE — CV Procedure (Signed)
   Cardiac Catheterization Operative Report  Dacen Frayre [id number] 5/7/201410:12 AM Harlow Asa, MD  Procedure Performed:  1. PTCA/DES x 1 mid LAD  Operator: Verne Carrow, MD  Indication:  79 yo male with history of CAD, CHF, ischemic cardiomyopathy admitted with volume overload, NSTEMI. Cardiac cath last week per Dr. Swaziland with occluded RCA with collateral flow and severe stenosis mid LAD. PCI of LAD staged given CHF.                                     Procedure Details: The risks, benefits, complications, treatment options, and expected outcomes were discussed with the patient. The patient and/or family concurred with the proposed plan, giving informed consent. The patient was brought to the cath lab after IV hydration was begun and oral premedication was given. The patient was further sedated with Versed. Allens test on right wrist was borderline. We initially elected to go from the right groin. The right groin was prepped and draped in a sterile fashion. 1% lidocaine was used for local anesthesia. A 6 French sheath was inserted into the right femoral artery. The iliac system was very tortuous. I had difficulty manipulating the guide. A 25 cm 7French sheath was placed in the right femoral artery. I was still unable to manipulate the guides and could not engage the left main. I then elected to gain access into the right radial artery. The right wrist was prepped and draped in a sterile fashion. 1% lidocaine was used for local anesthesia. The radial artery was accessed with modified Seldinger technique. 3 mg Verapamil given IV x 1. He was started on an Angiomax drip after a bolus was given. He had been loaded with Plavix previously. I then engaged the left main with a XB LAD 3.5 guiding catheter. I was able to wire both the Circumflex and LAD to gain better guide support. I then used a 2.5 x 12 mm balloon x 1 to pre-dilate the mid LAD stenosis. After much difficulty, I was able to pass a  3.5 x 20 mm Promus Premier DES into the mid LAD. The stent was carefully positioned and deployed in the mid LAD. The stent was deployed without difficulty. I post-dilated the stent with a 3.75 x 15 mm Nelson balloon x 1. There was an excellent angiographic result. The stenosis was taken from 90% down to 0%. There was calcification of the right femoral artery so I did not place a closure device.   There were no immediate complications. The patient was taken to the recovery area in stable condition.   Hemodynamic Findings: Central aortic pressure: 128/59  Impression: 1. Successful PTCA/DES x 1 mid LAD  Recommendations: Will need ASA/plavix for at least one year. Continue other cardiac meds.        Complications:  None; patient tolerated the procedure well.

## 2012-12-25 NOTE — H&P (View-Only) (Signed)
SUBJECTIVE: No chest pain or SOB.   BP 143/70  Pulse 65  Temp(Src) 97.5 F (36.4 C) (Oral)  Resp 23  Ht 5\' 9"  (1.753 m)  Wt 184 lb 8.4 oz (83.7 kg)  BMI 27.24 kg/m2  SpO2 94%  Intake/Output Summary (Last 24 hours) at 12/25/12 0723 Last data filed at 12/25/12 0600  Gross per 24 hour  Intake  640.8 ml  Output    863 ml  Net -222.2 ml    PHYSICAL EXAM General: Well developed, well nourished, in no acute distress. Alert and oriented x 3.  Psych:  Good affect, responds appropriately Neck: No JVD. No masses noted.  Lungs: Clear bilaterally with no wheezes or rhonci noted.  Heart: RRR with no murmurs noted. Abdomen: Bowel sounds are present. Soft, non-tender.  Extremities: No lower extremity edema.   LABS: Basic Metabolic Panel:  Recent Labs  95/62/13 2259 12/23/12 0500  12/24/12 0426 12/25/12 0411  NA 141  --   < > 142 140  K 3.0*  --   < > 3.6 4.2  CL 101  --   < > 102 104  CO2 29  --   < > 27 28  GLUCOSE 121*  --   < > 119* 102*  BUN 32*  --   < > 25* 24*  CREATININE 1.19  --   < > 0.97 0.92  CALCIUM 8.4  --   < > 8.5 8.6  MG  --  2.2  --   --   --   < > = values in this interval not displayed. CBC:  Recent Labs  12/24/12 0426 12/25/12 0411  WBC 10.5 11.0*  HGB 11.9* 12.6*  HCT 34.8* 36.1*  MCV 84.7 84.5  PLT 324 362   Current Meds: . albuterol  2.5 mg Nebulization Q6H  . aspirin EC  81 mg Oral Daily  . atorvastatin  80 mg Oral q1800  . carvedilol  3.125 mg Oral BID WC  . ciprofloxacin  500 mg Oral BID  . clopidogrel  75 mg Oral Q breakfast  . enalapril  2.5 mg Oral BID  . heparin subcutaneous  5,000 Units Subcutaneous Q8H  . nitroGLYCERIN  1 inch Topical Custom  . potassium chloride  40 mEq Oral BID  . sodium chloride  10-40 mL Intracatheter Q12H  . sodium chloride  3 mL Intravenous Q12H  . sodium chloride  3 mL Intravenous Q12H     ASSESSMENT AND PLAN: 79 year old white male transferred from [facility] on 12/20/12 for evaluation of  a non-ST elevation myocardial infarction and new onset congestive heart failure. LHC 12/20/12 per Dr. Swaziland with severe two-vessel obstructive coronary artery disease. The right coronary is occluded with excellent collateral flow. There is a high-grade stenosis in the LAD immediately following the first diagonal. Elevated left ventricular filling pressures. PCI of LAD deferred due to high EDP. Initially diuresed with intermittent IV lasix but later started on lasix drip. Remains on Lasix drip 8 mg per hour. Milrinone started 0.125 mcg and seen by CHF team yesterday.    1. NSTEMI: Plans for PCI of LAD today. He has been loaded with Plavix. He is taking ASA, statin and a beta blocker.   2. ICM:  EF 25%. He is on good medical therapy. Will need reassessment of LVEF following PCI and consideration for ICD if no improvement  3.  Acute on chronic systolic CHF: Volume status much better. Lasix drip and milrinone have been stopped.  4. Acute renal failure: Now at baseline.   5. UTI: on Cipro.     MCALHANY,CHRISTOPHER  5/7/20147:23 AM

## 2012-12-25 NOTE — Progress Notes (Signed)
Patient to cath lab, no complaints. CTM notified.

## 2012-12-25 NOTE — Progress Notes (Signed)
Pt had a  run of vtach (10 beats) while sleeping ,easily arousable when assesed, denies any discomforts , B/P 145/60 , no SOB. relayed to [employer] .no new order. Kept pt  Monitored.

## 2012-12-25 NOTE — Progress Notes (Signed)
  SUBJECTIVE: No chest pain or SOB.   BP 143/70  Pulse 65  Temp(Src) 97.5 F (36.4 C) (Oral)  Resp 23  Ht 5' 9" (1.753 m)  Wt 184 lb 8.4 oz (83.7 kg)  BMI 27.24 kg/m2  SpO2 94%  Intake/Output Summary (Last 24 hours) at 12/25/12 0723 Last data filed at 12/25/12 0600  Gross per 24 hour  Intake  640.8 ml  Output    863 ml  Net -222.2 ml    PHYSICAL EXAM General: Well developed, well nourished, in no acute distress. Alert and oriented x 3.  Psych:  Good affect, responds appropriately Neck: No JVD. No masses noted.  Lungs: Clear bilaterally with no wheezes or rhonci noted.  Heart: RRR with no murmurs noted. Abdomen: Bowel sounds are present. Soft, non-tender.  Extremities: No lower extremity edema.   LABS: Basic Metabolic Panel:  Recent Labs  12/22/12 2259 12/23/12 0500  12/24/12 0426 12/25/12 0411  NA 141  --   < > 142 140  K 3.0*  --   < > 3.6 4.2  CL 101  --   < > 102 104  CO2 29  --   < > 27 28  GLUCOSE 121*  --   < > 119* 102*  BUN 32*  --   < > 25* 24*  CREATININE 1.19  --   < > 0.97 0.92  CALCIUM 8.4  --   < > 8.5 8.6  MG  --  2.2  --   --   --   < > = values in this interval not displayed. CBC:  Recent Labs  12/24/12 0426 12/25/12 0411  WBC 10.5 11.0*  HGB 11.9* 12.6*  HCT 34.8* 36.1*  MCV 84.7 84.5  PLT 324 362   Current Meds: . albuterol  2.5 mg Nebulization Q6H  . aspirin EC  81 mg Oral Daily  . atorvastatin  80 mg Oral q1800  . carvedilol  3.125 mg Oral BID WC  . ciprofloxacin  500 mg Oral BID  . clopidogrel  75 mg Oral Q breakfast  . enalapril  2.5 mg Oral BID  . heparin subcutaneous  5,000 Units Subcutaneous Q8H  . nitroGLYCERIN  1 inch Topical Custom  . potassium chloride  40 mEq Oral BID  . sodium chloride  10-40 mL Intracatheter Q12H  . sodium chloride  3 mL Intravenous Q12H  . sodium chloride  3 mL Intravenous Q12H     ASSESSMENT AND PLAN: 79-year-old white male transferred from [facility] on 12/20/12 for evaluation of  a non-ST elevation myocardial infarction and new onset congestive heart failure. LHC 12/20/12 per Dr. Jordan with severe two-vessel obstructive coronary artery disease. The right coronary is occluded with excellent collateral flow. There is a high-grade stenosis in the LAD immediately following the first diagonal. Elevated left ventricular filling pressures. PCI of LAD deferred due to high EDP. Initially diuresed with intermittent IV lasix but later started on lasix drip. Remains on Lasix drip 8 mg per hour. Milrinone started 0.125 mcg and seen by CHF team yesterday.    1. NSTEMI: Plans for PCI of LAD today. He has been loaded with Plavix. He is taking ASA, statin and a beta blocker.   2. ICM:  EF 25%. He is on good medical therapy. Will need reassessment of LVEF following PCI and consideration for ICD if no improvement  3.  Acute on chronic systolic CHF: Volume status much better. Lasix drip and milrinone have been stopped.     4. Acute renal failure: Now at baseline.   5. UTI: on Cipro.     MCALHANY,CHRISTOPHER  5/7/20147:23 AM  

## 2012-12-25 NOTE — Progress Notes (Signed)
Site area: right groin  Site Prior to Removal:  Level 0  Pressure Applied For 20 MINUTES    Minutes Beginning at 1255  Manual:   yes  Patient Status During Pull:  AAO X 4  Post Pull Groin Site:  Level 0  Post Pull Instructions Given:  yes  Post Pull Pulses Present:  yes  Dressing Applied:  yes  Comments:  Tolerated procedure well.

## 2012-12-25 NOTE — Progress Notes (Signed)
Verbal approval by Dr. Earney Hamburg to discontinue pressure monitoring of CVP

## 2012-12-26 DIAGNOSIS — J9620 Acute and chronic respiratory failure, unspecified whether with hypoxia or hypercapnia: Secondary | ICD-10-CM

## 2012-12-26 LAB — BASIC METABOLIC PANEL
BUN: 24 mg/dL — ABNORMAL HIGH (ref 6–23)
Calcium: 8.7 mg/dL (ref 8.4–10.5)
Creatinine, Ser: 0.88 mg/dL (ref 0.50–1.35)
GFR calc non Af Amer: 78 mL/min — ABNORMAL LOW (ref >90)
Glucose, Bld: 98 mg/dL (ref 70–99)
Sodium: 143 mEq/L (ref 135–145)

## 2012-12-26 LAB — CBC
Hemoglobin: 12.4 g/dL — ABNORMAL LOW (ref 13.0–17.0)
MCH: 29.4 pg (ref 26.0–34.0)
MCHC: 34.7 g/dL (ref 30.0–36.0)
MCV: 84.6 fL (ref 78.0–100.0)

## 2012-12-26 MED ORDER — ENALAPRIL MALEATE 5 MG PO TABS
5.0000 mg | ORAL_TABLET | Freq: Two times a day (BID) | ORAL | Status: DC
  Administered 2012-12-26 – 2013-01-02 (×14): 5 mg via ORAL
  Filled 2012-12-26 (×15): qty 1

## 2012-12-26 MED ORDER — ALBUTEROL SULFATE (5 MG/ML) 0.5% IN NEBU
2.5000 mg | INHALATION_SOLUTION | Freq: Three times a day (TID) | RESPIRATORY_TRACT | Status: DC
  Administered 2012-12-26 – 2012-12-28 (×7): 2.5 mg via RESPIRATORY_TRACT
  Filled 2012-12-26 (×7): qty 0.5

## 2012-12-26 MED FILL — Sodium Chloride IV Soln 0.9%: INTRAVENOUS | Qty: 50 | Status: AC

## 2012-12-26 NOTE — Progress Notes (Signed)
Advanced Heart Failure Rounding Note   Subjective:   79 year old white male transferred from [facility] on 12/20/12 for evaluation of a non-ST elevation myocardial infarction and new onset congestive heart failure.  LHC 12/20/12 1. Severe two-vessel obstructive coronary artery disease. The right coronary is occluded with excellent collateral flow. There is a high-grade stenosis in the LAD immediately following the first diagonal.  2. Elevated left ventricular filling pressures  Lasix drip and Milrinone stopped on 12/24/12.   12/25/12 Successful PTCA/DES x 1 mid LAD.   Feels fine. Breathing Ok but sats in 80s at rest and worse with exertion.  Weight up 2 pounds.   Echo EF 25-30%  Objective:   Weight Range:  Vital Signs:   Temp:  [97.8 F (36.6 C)-98.1 F (36.7 C)] 97.9 F (36.6 C) (05/08 0747) Pulse Rate:  [63-75] 63 (05/08 0747) Resp:  [18-20] 18 (05/08 0747) BP: (138-188)/(35-66) 188/66 mmHg (05/08 0747) SpO2:  [90 %-96 %] 91 % (05/08 0747) Weight:  [186 lb 1.1 oz (84.4 kg)] 186 lb 1.1 oz (84.4 kg) (05/08 0509) Last BM Date: 12/23/12  Weight change: Filed Weights   12/24/12 0500 12/25/12 0406 12/26/12 0509  Weight: 180 lb 5.4 oz (81.8 kg) 184 lb 8.4 oz (83.7 kg) 186 lb 1.1 oz (84.4 kg)    Intake/Output:   Intake/Output Summary (Last 24 hours) at 12/26/12 0806 Last data filed at 12/26/12 0520  Gross per 24 hour  Intake 818.33 ml  Output   1226 ml  Net -407.67 ml     Physical Exam:  General:  Elderly chronically ill appearing. No resp difficulty HEENT: normal Neck: supple. JVP flat. Carotids 2+ bilat; no bruits. No lymphadenopathy or thryomegaly appreciated. Cor: PMI nondisplaced. Regular rate & rhythm. No rubs, gallops or murmurs. Lungs: clear Abdomen: soft, nontender, nondistended. No hepatosplenomegaly. No bruits or masses. Good bowel sounds.  Extremities: no cyanosis, clubbing, rash, edema.C ondom cathin place. Neuro: alert & orientedx3, cranial nerves  grossly intact. moves all 4 extremities w/o difficulty. Affect pleasant  Telemetry:  SR 76 pbm  Labs: Basic Metabolic Panel:  Recent Labs Lab 12/22/12 2259 12/23/12 0500 12/23/12 0718 12/23/12 1635 12/24/12 0426 12/25/12 0411 12/26/12 0300  NA 141  --  143 142 142 140 143  K 3.0*  --  3.4* 3.2* 3.6 4.2 4.2  CL 101  --  102 102 102 104 108  CO2 29  --  29 32 27 28 26   GLUCOSE 121*  --  109* 155* 119* 102* 98  BUN 32*  --  29* 26* 25* 24* 24*  CREATININE 1.19  --  1.09 0.98 0.97 0.92 0.88  CALCIUM 8.4  --  8.7 8.6 8.5 8.6 8.7  MG  --  2.2  --   --   --   --   --     Liver Function Tests:  Recent Labs Lab 12/20/12 0330  AST 47*  ALT 18  ALKPHOS 46  BILITOT 0.5  PROT 6.4  ALBUMIN 2.5*   No results found for this basename: LIPASE, AMYLASE,  in the last 168 hours No results found for this basename: AMMONIA,  in the last 168 hours  CBC:  Recent Labs Lab 12/22/12 0420 12/23/12 0718 12/24/12 0426 12/25/12 0411 12/26/12 0300  WBC 9.2 9.3 10.5 11.0* 11.2*  HGB 11.1* 11.6* 11.9* 12.6* 12.4*  HCT 32.3* 34.4* 34.8* 36.1* 35.7*  MCV 83.9 84.7 84.7 84.5 84.6  PLT 278 295 324 362 363    Cardiac Enzymes:  Recent Labs Lab 12/19/12 1632 12/19/12 2050 12/20/12 0330  TROPONINI 8.56* 9.67* 10.59*    BNP: BNP (last 3 results)  Recent Labs  12/19/12 1632 12/22/12 1130 12/23/12 0500  PROBNP 8646.0* 7018.0* 5717.0*     Other results:   Imaging: No results found.   Medications:     Scheduled Medications: . albuterol  2.5 mg Nebulization TID  . aspirin EC  81 mg Oral Daily  . atorvastatin  80 mg Oral q1800  . carvedilol  3.125 mg Oral BID WC  . ciprofloxacin  500 mg Oral BID  . clopidogrel  75 mg Oral Q breakfast  . enalapril  2.5 mg Oral BID  . furosemide  40 mg Oral Daily  . potassium chloride  40 mEq Oral BID  . sodium chloride  10-40 mL Intracatheter Q12H  . sodium chloride  3 mL Intravenous Q12H    Infusions: . sodium chloride 10 mL/hr  at 12/22/12 0700    PRN Medications: sodium chloride, acetaminophen, albuterol, ondansetron (ZOFRAN) IV, sodium chloride, sodium chloride   Assessment:   1. NSTEMI - 2 vessel CAD 2. ICM- EF 25%  3. Chronic Renal Failure 4. UTI 5. S/P Successful PTCA/DES x 1 mid LAD 6. Acute on chronic respiratory failure 7. Severe deconditioning 8. Acute systolic heart failure   Plan/Discussion:   Length of Stay: 7  Continue plavix and aspirin for 1 year.  CLEGG,AMY NP-C 12/26/2012, 8:06 AM  Advanced Heart Failure Team Pager [phone] (M-F; 7a - 4p)  Please contact [facility] for night-coverage after hours (4p -7a ) and weekends on [email or web address]  Patient seen and examined with Tonye Becket, NP. We discussed all aspects of the encounter. I agree with the assessment and plan as stated above.   He did well with PCI yesterday. Volume status much improved but does have significant O2 requirement and is very weak  Will transfer to 4700. Can increase diuretics as needed. BP up so will increase enalapril.  Will have PT see to ambulate and assess for home O2.   Daniel Bensimhon,MD 10:44 AM

## 2012-12-27 LAB — BASIC METABOLIC PANEL
CO2: 25 mEq/L (ref 19–32)
Chloride: 108 mEq/L (ref 96–112)
GFR calc Af Amer: 88 mL/min — ABNORMAL LOW (ref >90)
Sodium: 143 mEq/L (ref 135–145)

## 2012-12-27 LAB — CBC
Platelets: 342 10*3/uL (ref 150–400)
RBC: 4.24 MIL/uL (ref 4.22–5.81)
RDW: 15.2 % (ref 11.5–15.5)
WBC: 13.1 10*3/uL — ABNORMAL HIGH (ref 4.0–10.5)

## 2012-12-27 MED ORDER — ENOXAPARIN SODIUM 40 MG/0.4ML ~~LOC~~ SOLN
40.0000 mg | SUBCUTANEOUS | Status: DC
  Administered 2012-12-27 – 2013-01-02 (×7): 40 mg via SUBCUTANEOUS
  Filled 2012-12-27 (×7): qty 0.4

## 2012-12-27 NOTE — Progress Notes (Signed)
CARDIAC REHAB PHASE I   PRE:  Rate/Rhythm: 72SR  BP:  Supine:   Sitting: 126/53  Standing:    SaO2: 93%2.5L  MODE:  Ambulation: 64 ft  Outside of room   POST:  Rate/Rhythm: 76  BP:  Supine:   Sitting: 135/62  Standing:    SaO2: 89%6L 1320-1404 Pt lying in bed. Stated he walked about 3-4 hours ago. Sat pt on side of bed and noticed that condom cath was off. Changed pt's gown and stripped bed. Cleaned pt. Pt walked 64 ft on 6L with rolling walker, gait belt and asst x 2. Needed cues to keep close to walker. Tendency to get it too far out. C/o slight dizziness. Tired by end of walk. To recliner with call bell. Will see as asst x 2. Sats at 89% 6L after walk. Decreased back to 2.5 L for resting.   Luetta Nutting, RN BSN  12/27/2012 1:58 PM

## 2012-12-27 NOTE — Evaluation (Signed)
Physical Therapy Evaluation Patient Details Name: Christopher [name] MRN: [id number] DOB: 03/10/1928 Today's Date: 12/27/2012 Time: 4098-1191 PT Time Calculation (min): 31 min  PT Assessment / Plan / Recommendation Clinical Impression  Pt is 79 year old white male transferred from [facility] on 12/20/12 for evaluation of a non-ST elevation myocardial infarction and new onset congestive heart failure. LHC 12/20/12 per Dr. [place] with severe two-vessel obstructive coronary artery disease. Pt with s/p angioplasy of LAD 12/25/12.  Pt will benefit from acute PT services to improve overall mobility and prepare for safe d/c home.    PT Assessment  Patient needs continued PT services    Follow Up Recommendations  Home health PT;Supervision - Intermittent    Barriers to Discharge None      Equipment Recommendations   (Rollater and Home O2)    Frequency Min 3X/week    Precautions / Restrictions Precautions Precautions: Fall Restrictions Weight Bearing Restrictions: No   Pertinent Vitals/Pain No c/o pain; SaO2 79% on RA and increased to 87% on 2L      Mobility  Bed Mobility Details for Bed Mobility Assistance: Pt up in recliner upon arrival. Transfers Transfers: Sit to Stand;Stand to Sit Sit to Stand: 4: Min guard;With upper extremity assist;With armrests;From chair/3-in-1 Stand to Sit: 4: Min guard;With upper extremity assist;With armrests;To chair/3-in-1 Details for Transfer Assistance: VCs for safe hand placement Ambulation/Gait Ambulation/Gait Assistance: 4: Min guard Ambulation Distance (Feet): 15 Feet Assistive device: Rolling walker Ambulation/Gait Assistance Details: Minguard for safety with cues for hand placement.  Limited due to overall fatigue and decrease in SaO2 Gait Pattern: Step-through pattern;Decreased stride length;Shuffle;Trunk flexed Gait velocity: decreased Stairs: No Wheelchair Mobility Wheelchair Mobility: No    Exercises     PT Diagnosis: Difficulty  walking;Generalized weakness  PT Problem List: Decreased strength;Decreased balance;Decreased mobility;Decreased activity tolerance;Decreased knowledge of use of DME;Cardiopulmonary status limiting activity PT Treatment Interventions: DME instruction;Gait training;Stair training;Functional mobility training;Therapeutic activities;Therapeutic exercise;Patient/family education   PT Goals Acute Rehab PT Goals PT Goal Formulation: With patient Time For Goal Achievement: 01/03/13 Potential to Achieve Goals: Good Pt will go Supine/Side to Sit: with modified independence PT Goal: Supine/Side to Sit - Progress: Goal set today Pt will go Sit to Stand: with modified independence PT Goal: Sit to Stand - Progress: Goal set today Pt will go Stand to Sit: with modified independence PT Goal: Stand to Sit - Progress: Goal set today Pt will Ambulate: 51 - 150 feet;with modified independence;with rolling walker PT Goal: Ambulate - Progress: Goal set today Pt will Go Up / Down Stairs: 3-5 stairs;with min assist;with least restrictive assistive device PT Goal: Up/Down Stairs - Progress: Goal set today Pt will Perform Home Exercise Program: Independently PT Goal: Perform Home Exercise Program - Progress: Goal set today  Visit Information  Last PT Received On: 12/27/12 Assistance Needed: +1    Subjective Data  Subjective: "I'm doing alright. Just tired." Patient Stated Goal: To eventually go home with family   Prior Functioning  Home Living Lives With: Family (nephew) Available Help at Discharge: Family;Available PRN/intermittently (nephew works days, sister comes over alot per pt) Type of Home: House Home Access: Stairs to enter Secretary/administrator of Steps: 3 Entrance Stairs-Rails: None Home Layout: One level Bathroom Shower/Tub: Walk-in Contractor: Standard Home Adaptive Equipment: Paediatric nurse with back;Grab bars in shower Prior Function Level of Independence:  Independent Able to Take Stairs?: Yes Driving: No Vocation: Retired Musician: No difficulties Dominant Hand: Right    Cognition  Cognition Arousal/Alertness: Awake/alert Behavior  During Therapy: WFL for tasks assessed/performed Overall Cognitive Status: Within Functional Limits for tasks assessed    Extremity/Trunk Assessment Right Upper Extremity Assessment RUE ROM/Strength/Tone: Within functional levels Left Upper Extremity Assessment LUE ROM/Strength/Tone: Within functional levels Right Lower Extremity Assessment RLE ROM/Strength/Tone: Within functional levels Left Lower Extremity Assessment LLE ROM/Strength/Tone: Within functional levels   Balance Balance Balance Assessed: Yes Static Standing Balance Static Standing - Balance Support: Bilateral upper extremity supported Static Standing - Level of Assistance: 5: Stand by assistance Static Standing - Comment/# of Minutes: ~2 minutes while donning new gown after comdon cath came off  End of Session PT - End of Session Equipment Utilized During Treatment: Gait belt;Oxygen (3L) Activity Tolerance: Patient limited by fatigue Patient left: in chair;with call bell/phone within reach;with nursing in room Nurse Communication: Mobility status  GP     Nohealani Medinger 12/27/2012, 4:27 PM Jake Shark, PT DPT [phone]

## 2012-12-27 NOTE — Evaluation (Signed)
Occupational Therapy Evaluation Patient Details Name: Christopher [name] MRN: [id number] DOB: [birth year] Today's Date: 12/27/2012 Time: 7829-5621 OT Time Calculation (min): 23 min  OT Assessment / Plan / Recommendation Clinical Impression  This 79 yo male admitted with NSTEMI and also found to have CHF, underwent a PCI of LAD on 12/25/12 presents to acute OT with problems below. Will benefit from acute OT with follow up HHOT.     OT Assessment  Patient needs continued OT Services    Follow Up Recommendations  Home health OT    Barriers to Discharge None    Equipment Recommendations   (rollator)          Precautions / Restrictions Precautions Precautions: Fall Restrictions Weight Bearing Restrictions: No   Pertinent Vitals/Pain sats on 2 liters 95% HR 69; on RA to bathroom pts sats 83% and HR 74; O2 reapplied at 2 liters and pt purse lipped breathing O2 started to gradually come up--did however briefly turn him up to 3 liters to get back up to 92%; then back to recliner on 2 liters with O2 sats dropping to 85% again had pt purse lip breath.    ADL  Eating/Feeding: Simulated;Independent Where Assessed - Eating/Feeding: Chair Grooming: Simulated;Set up Where Assessed - Grooming: Unsupported sitting Upper Body Bathing: Simulated;Set up Where Assessed - Upper Body Bathing: Unsupported sitting Lower Body Bathing: Simulated;Min guard Where Assessed - Lower Body Bathing: Unsupported sit to stand Upper Body Dressing: Simulated;Set up Where Assessed - Upper Body Dressing: Unsupported sitting Lower Body Dressing: Simulated;Min guard Where Assessed - Lower Body Dressing: Supported sit to stand Toilet Transfer: Performed;Min guard Statistician Method: Sit to Barista: Regular height toilet;Grab bars Toileting - Architect and Hygiene: Simulated;Min guard Where Assessed - Engineer, mining and Hygiene: Standing Equipment Used: Rolling walker  (up to BR with O2 first on RA then 3 liters, 2 liters at end) Transfers/Ambulation Related to ADLs: Min guard A for all at RW level. ADL Comments: Can doff/don socks crossing legs    OT Diagnosis: Generalized weakness  OT Problem List: Decreased strength;Cardiopulmonary status limiting activity OT Treatment Interventions:   Energy conservation  OT Goals Acute Rehab OT Goals OT Goal Formulation: With patient Time For Goal Achievement: 01/03/13 Potential to Achieve Goals: Good Miscellaneous OT Goals Miscellaneous OT Goal #1: Pt will be aware of energy conservation techniques and how these will help him while he is trying to recover and beyond. Miscellaneous OT Goal #2: Pt will independently use purse lipped breathing when he gets up and then stops where he is going so as to keep his O2 sats up to eventually get off O2 at home. OT Goal: Miscellaneous Goal #2 - Progress: Goal set today  Visit Information  Last OT Received On: 12/27/12 Assistance Needed: +1    Subjective Data  Subjective: I live with my nephew, he works during the day. My sister lives behind me and comes over a lot"   Prior Functioning     Home Living Lives With: Family (nephew) Available Help at Discharge: Family;Available PRN/intermittently (nephew works days, sister comes over alot per pt) Type of Home: House Bathroom Shower/Tub: Walk-in shower;Curtain Bathroom Toilet: Standard Home Adaptive Equipment: Paediatric nurse with back;Grab bars in shower Prior Function Level of Independence: Independent Able to Take Stairs?: Yes Driving: No Vocation: Retired Musician: No difficulties Dominant Hand: Right            Cognition  Cognition Arousal/Alertness: Awake/alert Behavior During Therapy: WFL for tasks  assessed/performed Overall Cognitive Status: Within Functional Limits for tasks assessed    Extremity/Trunk Assessment Right Upper Extremity Assessment RUE ROM/Strength/Tone: Within  functional levels Left Upper Extremity Assessment LUE ROM/Strength/Tone: Within functional levels     Mobility Bed Mobility Details for Bed Mobility Assistance: Pt up in recliner upon arrival. Transfers Transfers: Sit to Stand;Stand to Sit Sit to Stand: 4: Min guard;With upper extremity assist;With armrests;From chair/3-in-1 Stand to Sit: 4: Min guard;With upper extremity assist;With armrests;To chair/3-in-1 Details for Transfer Assistance: VCs for safe hand placement           End of Session OT - End of Session Activity Tolerance:  (limited by drop in O2 sats) Patient left: in chair;with call bell/phone within reach       Evette Georges [phone] 12/27/2012, 3:50 PM

## 2012-12-27 NOTE — Progress Notes (Signed)
12/27/12 1530 In to speak with pt. about home health needs.  Pt. is interested in having home health.  After looking at agency list, pt. chose [facility].  In addition, pt. O2 sats on R/A are 83%, and pt. will need home continuous oxygen.  OT has recommended DME rollator.  TC to Lupita Leash, with [facility] to give referral for [facility] RN, PT/OT, and TC to [place], to give referral for oxygen and rollator.  Pt. lives at home with nephew, in [place].  Pt. may dc over weekend.  Tera Mater, RN, BSN NCM [phone]

## 2012-12-27 NOTE — Progress Notes (Signed)
Advanced Heart Failure Rounding Note   Subjective:   79 year old white male transferred from [facility] on 12/20/12 for evaluation of a non-ST elevation myocardial infarction and new onset congestive heart failure.  LHC 12/20/12 1. Severe two-vessel obstructive coronary artery disease. The right coronary is occluded with excellent collateral flow. There is a high-grade stenosis in the LAD immediately following the first diagonal.  2. Elevated left ventricular filling pressures  Lasix drip and Milrinone stopped on 12/24/12.    12/25/12 Successful PTCA/DES x 1 mid LAD. Yesterday enalapril increased to 5 mg bid. Weight down 1 pound. Overall his weight is 21 pounds.  Complains of dizziness and weaknes. Denies SOB/PND/Orthopena.]   Sats dropped to 85 % with cardiac rehab but increased to 95% on 6 liters [place] oxygen. BP much improved on increased dose of enalapril.  Echo EF 25-30%   Objective:   Weight Range:  Vital Signs:   Temp:  [97.4 F (36.3 C)-97.8 F (36.6 C)] 97.7 F (36.5 C) (05/09 0525) Pulse Rate:  [68-79] 79 (05/09 0525) Resp:  [17-20] 20 (05/09 0525) BP: (90-154)/(37-74) 138/74 mmHg (05/09 0525) SpO2:  [90 %-97 %] 95 % (05/09 0736) FiO2 (%):  [28 %] 28 % (05/09 0736) Weight:  [176 lb 2.4 oz (79.9 kg)-177 lb 4 oz (80.4 kg)] 176 lb 2.4 oz (79.9 kg) (05/09 0525) Last BM Date: 12/25/12  Weight change: Filed Weights   12/26/12 0509 12/26/12 1339 12/27/12 0525  Weight: 186 lb 1.1 oz (84.4 kg) 177 lb 4 oz (80.4 kg) 176 lb 2.4 oz (79.9 kg)    Intake/Output:   Intake/Output Summary (Last 24 hours) at 12/27/12 0800 Last data filed at 12/27/12 0701  Gross per 24 hour  Intake   1200 ml  Output   1270 ml  Net    -70 ml     Physical Exam:  General:  Elderly frail appearing. No resp difficulty. Sitting in chair.  HEENT: normal Neck: supple. JVP flat. Carotids 2+ bilat; no bruits. No lymphadenopathy or thryomegaly appreciated. Cor: PMI nondisplaced. Regular rate & rhythm.  No rubs, gallops or murmurs. Lungs: clear Abdomen: soft, nontender, nondistended. No hepatosplenomegaly. No bruits or masses. Good bowel sounds.  Extremities: no cyanosis, clubbing, rash, edema. C ondom cathin place. Neuro: alert & orientedx3, cranial nerves grossly intact. moves all 4 extremities w/o difficulty. Affect pleasant  Telemetry:  SR 76 pbm  Labs: Basic Metabolic Panel:  Recent Labs Lab 12/22/12 2259 12/23/12 0500  12/23/12 1635 12/24/12 0426 12/25/12 0411 12/26/12 0300 12/27/12 0500  NA 141  --   < > 142 142 140 143 143  K 3.0*  --   < > 3.2* 3.6 4.2 4.2 3.9  CL 101  --   < > 102 102 104 108 108  CO2 29  --   < > 32 27 28 26 25   GLUCOSE 121*  --   < > 155* 119* 102* 98 112*  BUN 32*  --   < > 26* 25* 24* 24* 24*  CREATININE 1.19  --   < > 0.98 0.97 0.92 0.88 0.93  CALCIUM 8.4  --   < > 8.6 8.5 8.6 8.7 9.1  MG  --  2.2  --   --   --   --   --   --   < > = values in this interval not displayed.  Liver Function Tests: No results found for this basename: AST, ALT, ALKPHOS, BILITOT, PROT, ALBUMIN,  in the last 168 hours  No results found for this basename: LIPASE, AMYLASE,  in the last 168 hours No results found for this basename: AMMONIA,  in the last 168 hours  CBC:  Recent Labs Lab 12/23/12 0718 12/24/12 0426 12/25/12 0411 12/26/12 0300 12/27/12 0500  WBC 9.3 10.5 11.0* 11.2* 13.1*  HGB 11.6* 11.9* 12.6* 12.4* 12.0*  HCT 34.4* 34.8* 36.1* 35.7* 36.8*  MCV 84.7 84.7 84.5 84.6 86.8  PLT 295 324 362 363 342    Cardiac Enzymes: No results found for this basename: CKTOTAL, CKMB, CKMBINDEX, TROPONINI,  in the last 168 hours  BNP: BNP (last 3 results)  Recent Labs  12/19/12 1632 12/22/12 1130 12/23/12 0500  PROBNP 8646.0* 7018.0* 5717.0*     Other results:   Imaging: No results found.   Medications:     Scheduled Medications: . albuterol  2.5 mg Nebulization TID  . aspirin EC  81 mg Oral Daily  . atorvastatin  80 mg Oral q1800  .  carvedilol  3.125 mg Oral BID WC  . ciprofloxacin  500 mg Oral BID  . clopidogrel  75 mg Oral Q breakfast  . enalapril  5 mg Oral BID  . furosemide  40 mg Oral Daily  . potassium chloride  40 mEq Oral BID  . sodium chloride  10-40 mL Intracatheter Q12H  . sodium chloride  3 mL Intravenous Q12H    Infusions: . sodium chloride 10 mL/hr at 12/22/12 0700    PRN Medications: sodium chloride, acetaminophen, albuterol, ondansetron (ZOFRAN) IV, sodium chloride, sodium chloride   Assessment:   1. NSTEMI - 2 vessel CAD 2. ICM- EF 25%  3. Chronic Renal Failure 4. UTI 5. S/P Successful PTCA/DES x 1 mid LAD 6. Acute on chronic respiratory failure 7. Severe deconditioning 8. Acute systolic heart failure   Plan/Discussion:   Length of Stay: 8 Appears euvolemic. Stop lasix. Will not titrate ace/beta blocker.   Consult PT/OT. ? Rehab.   Consult case manager will need home oxygen and HHRN.   CLEGG,AMY NP-C 12/27/2012, 8:00 AM Advanced Heart Failure Team Pager [phone] (M-F; 7a - 4p)  Please contact [facility] for night-coverage after hours (4p -7a ) and weekends on [email or web address]  Patient seen and examined with Tonye Becket, NP. We discussed all aspects of the encounter. I agree with the assessment and plan as stated above.   He is improving but is severely deconditioned. Will ask OT/PT to see and see if he is a candidate for CIR vs short stay at SNF.  I suspect he is dry and agree with holding lasix for now (may need to reinstitute low dose prior to d/c). Will continue low-dose ACE and b-blocker. Will need home O2. Case Manager to see.  Continue Plavix for recent LAD pci. Hopefully EF will recover post PCI.  Over weekend would focus on ambulation and building his strength. Can add back lasix as needed.   Zen Felling,MD 8:45 AM

## 2012-12-27 NOTE — Discharge Planning (Signed)
SATURATION QUALIFICATIONS: (This note is used to comply with regulatory documentation for home oxygen)  Patient Saturations on Room Air at Rest = 83%  Patient Saturations on Room Air while Ambulating = 83%  Patient Saturations on 2 Liters of oxygen while Ambulating = 92%  Please briefly explain why patient needs home oxygen:pt. Has CHF, and desats on R/A

## 2012-12-27 NOTE — Progress Notes (Signed)
{  PT SATURATION QUALIFICATIONS: (This note is used to comply with regulatory documentation for home oxygen)  Patient Saturations on Room Air at Rest = 89%  Patient Saturations on Room Air while Ambulating = 79%  Patient Saturations on 3L Liters of oxygen while Ambulating = 88%  Please briefly explain why patient needs home oxygen:  Pt easily SOB with minimal mobility with 3/4 on Dyspnea Scale. Pt SaO2 decreases when on RA quickly and needs several minutes to increase SaO2 stats.  [place], [place] DPT [phone]

## 2012-12-27 NOTE — Progress Notes (Signed)
Patient evaluated for community based chronic disease management services with [facility] Management Program as a benefit of patient's [employer].  Spoke with patient at bedside to explain [facility] Care Management services.  Patient feels that he and his nephew can continue to manage his care without [facility] services.  Left contact information and [facility] literature at bedside. Made inpatient Case Manager aware that [facility] Care Management has consulted. Of note, [facility] Care Management services does not replace or interfere with any services that are arranged by inpatient case management or social work.  For additional questions or referrals please contact Anibal Henderson BSN RN [facility] Liaison at [phone].

## 2012-12-28 ENCOUNTER — Inpatient Hospital Stay (HOSPITAL_COMMUNITY): Payer: Medicare Other

## 2012-12-28 LAB — CBC
HCT: 34.6 % — ABNORMAL LOW (ref 39.0–52.0)
MCV: 86.7 fL (ref 78.0–100.0)
Platelets: 349 10*3/uL (ref 150–400)
RBC: 3.99 MIL/uL — ABNORMAL LOW (ref 4.22–5.81)
RDW: 15.1 % (ref 11.5–15.5)
WBC: 12.5 10*3/uL — ABNORMAL HIGH (ref 4.0–10.5)

## 2012-12-28 LAB — BASIC METABOLIC PANEL
BUN: 24 mg/dL — ABNORMAL HIGH (ref 6–23)
CO2: 24 mEq/L (ref 19–32)
Chloride: 108 mEq/L (ref 96–112)
Creatinine, Ser: 0.91 mg/dL (ref 0.50–1.35)
GFR calc Af Amer: 89 mL/min — ABNORMAL LOW (ref >90)
Potassium: 3.6 mEq/L (ref 3.5–5.1)

## 2012-12-28 NOTE — Progress Notes (Signed)
CARDIAC REHAB PHASE I   PRE:  Rate/Rhythm: 72 sr    BP: sitting 120/70    SaO2: 93 2L  MODE:  Ambulation: 100 ft   POST:  Rate/Rhythm: 72 SR with PVC    BP: sitting 98/50     SaO2: 91 6L  Stronger today. Increased distance with better right foot/leg control. Stayed closer to RW. Still requiring 6L O2. BP lower after walk. Sts some dizziness. To recliner with pillows (sts his back hurts with long duration in recliner).  [id number]   Christopher [name] [place] CES, ACSM 12/28/2012 10:01 AM

## 2012-12-28 NOTE — Progress Notes (Signed)
Advanced Heart Failure Rounding Note   Subjective:   79 year old white male transferred from [facility] on 12/20/12 for evaluation of a non-ST elevation myocardial infarction and new onset congestive heart failure.  LHC 12/20/12 1. Severe two-vessel obstructive coronary artery disease. The right coronary is occluded with excellent collateral flow. There is a high-grade stenosis in the LAD immediately following the first diagonal.  2. Elevated left ventricular filling pressures  Lasix drip and Milrinone stopped on 12/24/12.    12/25/12 Successful PTCA/DES x 1 mid LAD. Yesterday enalapril increased to 5 mg bid. Weight down 1 pound. Overall his weight is 21 pounds.  Complains of dizziness and weaknes. Denies SOB/PND/Orthopena.]   Ambulating with PT but sats dropping into high 70s. Improve with O2. Remains off lasix. Weight and renal function stable. BP improved SBP 120-130. Denies CP. +dyspnea with minimal exertion.   Echo EF 25-30%   Objective:   Weight Range:  Vital Signs:   Temp:  [98.2 F (36.8 C)-99 F (37.2 C)] 99 F (37.2 C) (05/10 0622) Pulse Rate:  [63-78] 63 (05/10 0622) Resp:  [18-20] 18 (05/10 0622) BP: (101-135)/(41-70) 124/67 mmHg (05/10 0622) SpO2:  [83 %-95 %] 95 % (05/10 0807) FiO2 (%):  [30 %] 30 % (05/09 1334) Weight:  [80 kg (176 lb 5.9 oz)] 80 kg (176 lb 5.9 oz) (05/10 0622) Last BM Date: 12/25/12  Weight change: Filed Weights   12/26/12 1339 12/27/12 0525 12/28/12 0622  Weight: 80.4 kg (177 lb 4 oz) 79.9 kg (176 lb 2.4 oz) 80 kg (176 lb 5.9 oz)    Intake/Output:   Intake/Output Summary (Last 24 hours) at 12/28/12 0954 Last data filed at 12/28/12 0846  Gross per 24 hour  Intake   1140 ml  Output   1025 ml  Net    115 ml     Physical Exam:  General:  Elderly frail appearing. No resp difficulty. Sitting in chair.  HEENT: normal Neck: supple. JVP flat. Carotids 2+ bilat; no bruits. No lymphadenopathy or thryomegaly appreciated. Cor: PMI  nondisplaced. Regular rate & rhythm. No rubs, gallops or murmurs. Lungs: clear Abdomen: soft, nontender, nondistended. No hepatosplenomegaly. No bruits or masses. Good bowel sounds.  Extremities: no cyanosis, clubbing, rash, edema. C ondom cathin place. Neuro: alert & orientedx3, cranial nerves grossly intact. moves all 4 extremities w/o difficulty. Affect pleasant  Telemetry:  SR 76 pbm  Labs: Basic Metabolic Panel:  Recent Labs Lab 12/22/12 2259 12/23/12 0500  12/24/12 0426 12/25/12 0411 12/26/12 0300 12/27/12 0500 12/28/12 0500  NA 141  --   < > 142 140 143 143 143  K 3.0*  --   < > 3.6 4.2 4.2 3.9 3.6  CL 101  --   < > 102 104 108 108 108  CO2 29  --   < > 27 28 26 25 24   GLUCOSE 121*  --   < > 119* 102* 98 112* 100*  BUN 32*  --   < > 25* 24* 24* 24* 24*  CREATININE 1.19  --   < > 0.97 0.92 0.88 0.93 0.91  CALCIUM 8.4  --   < > 8.5 8.6 8.7 9.1 8.3*  MG  --  2.2  --   --   --   --   --   --   < > = values in this interval not displayed.  Liver Function Tests: No results found for this basename: AST, ALT, ALKPHOS, BILITOT, PROT, ALBUMIN,  in the last 168  hours No results found for this basename: LIPASE, AMYLASE,  in the last 168 hours No results found for this basename: AMMONIA,  in the last 168 hours  CBC:  Recent Labs Lab 12/24/12 0426 12/25/12 0411 12/26/12 0300 12/27/12 0500 12/28/12 0500  WBC 10.5 11.0* 11.2* 13.1* 12.5*  HGB 11.9* 12.6* 12.4* 12.0* 11.4*  HCT 34.8* 36.1* 35.7* 36.8* 34.6*  MCV 84.7 84.5 84.6 86.8 86.7  PLT 324 362 363 342 349    Cardiac Enzymes: No results found for this basename: CKTOTAL, CKMB, CKMBINDEX, TROPONINI,  in the last 168 hours  BNP: BNP (last 3 results)  Recent Labs  12/19/12 1632 12/22/12 1130 12/23/12 0500  PROBNP 8646.0* 7018.0* 5717.0*     Other results:   Imaging: No results found.   Medications:     Scheduled Medications: . albuterol  2.5 mg Nebulization TID  . aspirin EC  81 mg Oral Daily  .  atorvastatin  80 mg Oral q1800  . carvedilol  3.125 mg Oral BID WC  . ciprofloxacin  500 mg Oral BID  . clopidogrel  75 mg Oral Q breakfast  . enalapril  5 mg Oral BID  . enoxaparin (LOVENOX) injection  40 mg Subcutaneous Q24H  . sodium chloride  10-40 mL Intracatheter Q12H  . sodium chloride  3 mL Intravenous Q12H    Infusions: . sodium chloride 10 mL/hr at 12/22/12 0700    PRN Medications: sodium chloride, acetaminophen, albuterol, ondansetron (ZOFRAN) IV, sodium chloride, sodium chloride   Assessment:   1. NSTEMI - 2 vessel CAD 2. ICM- EF 25%  3. Chronic Renal Failure 4. UTI 5. S/P Successful PTCA/DES x 1 mid LAD 6. Acute on chronic respiratory failure 7. Severe deconditioning 8. Acute systolic heart failure   Plan/Discussion:     He is improving but is severely deconditioned and marked desaturation with minimal exertion.   Volume status looks good. Continue to hold lasix. Will continue low-dose ACE and b-blocker. Can add back lasix as needed. Will check CXR. May need pulmonary to see. Will certainly need home O2. Case Manager consulted.  Continue Plavix for recent LAD pci. Hopefully EF will recover post PCI.  Continue to focus on ambulation and building his strength. Would love to see him go to CIR and I think it will help. Will ask PT to comment. CIR consult placed.   Seini Lannom,MD 9:54 AM

## 2012-12-29 DIAGNOSIS — I2510 Atherosclerotic heart disease of native coronary artery without angina pectoris: Secondary | ICD-10-CM | POA: Diagnosis present

## 2012-12-29 LAB — CBC
HCT: 33.9 % — ABNORMAL LOW (ref 39.0–52.0)
Hemoglobin: 11 g/dL — ABNORMAL LOW (ref 13.0–17.0)
MCV: 87.6 fL (ref 78.0–100.0)
RDW: 15.5 % (ref 11.5–15.5)
WBC: 10.5 10*3/uL (ref 4.0–10.5)

## 2012-12-29 LAB — BASIC METABOLIC PANEL
BUN: 22 mg/dL (ref 6–23)
Chloride: 108 mEq/L (ref 96–112)
Creatinine, Ser: 0.91 mg/dL (ref 0.50–1.35)
Glucose, Bld: 85 mg/dL (ref 70–99)
Potassium: 3.4 mEq/L — ABNORMAL LOW (ref 3.5–5.1)

## 2012-12-29 MED ORDER — POTASSIUM CHLORIDE CRYS ER 20 MEQ PO TBCR
40.0000 meq | EXTENDED_RELEASE_TABLET | Freq: Two times a day (BID) | ORAL | Status: AC
  Administered 2012-12-29 (×2): 40 meq via ORAL
  Filled 2012-12-29 (×2): qty 2

## 2012-12-29 MED ORDER — CARVEDILOL 6.25 MG PO TABS
6.2500 mg | ORAL_TABLET | Freq: Two times a day (BID) | ORAL | Status: DC
  Administered 2012-12-29 – 2013-01-02 (×9): 6.25 mg via ORAL
  Filled 2012-12-29 (×11): qty 1

## 2012-12-29 MED ORDER — SPIRONOLACTONE 25 MG PO TABS
25.0000 mg | ORAL_TABLET | Freq: Every day | ORAL | Status: DC
  Administered 2012-12-29 – 2013-01-02 (×5): 25 mg via ORAL
  Filled 2012-12-29 (×5): qty 1

## 2012-12-29 NOTE — Consult Note (Signed)
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 Residual infiltrates on CXR after 30 lb diuresis. Pulmonary consulted to see if there is a separate pulmonary diagnosis.  SIGNIFICANT EVENTS / STUDIES:  From [place] 5/2 ECHO 12-28-2012 EF 25-30%, hypokinesis PCI stent 12/25/12  LINES / TUBES: PIV  CULTURES:   ANTIBIOTICS:   HISTORY OF PRESENT ILLNESS:  Smoked 20 years, ending 51s. 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 No asbestos. Denies known lung disease or pneumonia. Admits episodes of reflux with probable aspiration in past. No arthritis. Neg fam hx lung disease. Onset 1 year ago of dyspnea w/ exertion, gradually worse. Little or no cough, no wheeze or phlegm, no wheeze.  PAST MEDICAL HISTORY :  Past Medical History  Diagnosis Date  . Hypertension   . BPH (benign prostatic hyperplasia)   . Hemorrhoids    History reviewed. No pertinent past surgical history. Prior to Admission medications   Medication Sig Start Date End Date Taking? Authorizing Provider  amLODipine (NORVASC) 10 MG tablet Take 10 mg by mouth daily.   Yes Historical Provider, MD  hydrocortisone (PROCTOZONE-HC) 2.5 % rectal cream Place 1 application rectally 2 (two) times daily.   Yes Historical Provider, MD  polyethylene glycol (MIRALAX / GLYCOLAX) packet Take 17 g by mouth daily.   Yes Historical Provider, MD  sulfamethoxazole-trimethoprim (BACTRIM DS) 800-160 MG per tablet Take 1 tablet by mouth 2 (two) times daily. Take for 10 days started  11-26-12   Yes Historical Provider, MD   No Known Allergies  FAMILY HISTORY:   Family History  Problem Relation Age of Onset  . Other      no premature CAD.   SOCIAL HISTORY:  reports that he has quit smoking. He does not have any smokeless tobacco history on file. He reports that he does not drink alcohol. His drug history is not on file.  REVIEW OF SYSTEMS:   Constitutional: Negative for fever, chills, + current weight loss, malaise/fatigue and diaphoresis.  HENT: Negative for hearing loss, ear pain, nosebleeds, congestion, sore throat, neck pain, tinnitus and ear discharge.   Eyes: Negative for blurred vision, double vision, photophobia, pain, discharge and redness.  Respiratory: Negative for cough, hemoptysis, sputum production, +shortness of breath,  No-wheezing and stridor.   Cardiovascular: Negative for chest pain, palpitations, orthopnea, claudication, leg swelling and PND.  Gastrointestinal: Negative for heartburn, nausea, vomiting, abdominal pain, diarrhea, constipation, blood in stool (x past hx hemorrhoids),  and melena.  Genitourinary: Negative for dysuria, urgency, frequency, hematuria and flank pain.  Musculoskeletal: Negative for myalgias, back pain, joint pain and falls.  Skin: Negative for itching and rash.  Neurological: Negative for dizziness, tingling, tremors, sensory change, speech change, focal weakness, seizures, loss of consciousness, weakness and headaches.  Endo/Heme/Allergies: Negative for environmental allergies and polydipsia. Does not bruise/bleed easily.  SUBJECTIVE:   VITAL SIGNS: Temp:  [97.6 F (36.4 C)-98.5 F (36.9 C)] 98.5 F (36.9 C) (05/11 1349) Pulse Rate:  [58-70] 58 (05/11 1349) Resp:  [20] 20 (05/11 1349) BP: (113-130)/(56-66) 125/61 mmHg (05/11 1349) SpO2:  [93 %-96 %] 93 % (  05/11 1349) Weight:  [79.3 kg (174 lb 13.2 oz)] 79.3 kg (174 lb 13.2 oz) (05/11 0430)  PHYSICAL EXAMINATION: General:  Alert, calm oriented, pleasant gentleman Neuro: No focal deficit HEENT:  PERRLA, speech clear, no stridor Neck:   Trachea midline, thryroid not enlarged Cardiovascular:  RRR w/ occ extra beat. No murmur, rub or gallop heard,. No JVD or pretibial edema Lungs:  Fine crackles R post base, no dullness, rub or wheeze. Unlabored on nasal prong O2 2L. Abdomen:  Softly protuberant, quiet. Non-tender. No HSM. Musculoskeletal:  Symmetrical, normal for age Skin: no rash seen   Recent Labs Lab 12/27/12 0500 12/28/12 0500 12/29/12 0500  NA 143 143 143  K 3.9 3.6 3.4*  CL 108 108 108  CO2 25 24 23   BUN 24* 24* 22  CREATININE 0.93 0.91 0.91  GLUCOSE 112* 100* 85    Recent Labs Lab 12/27/12 0500 12/28/12 0500 12/29/12 0500  HGB 12.0* 11.4* 11.0*  HCT 36.8* 34.6* 33.9*  WBC 13.1* 12.5* 10.5  PLT 342 349 374   Dg Chest 2 View  12/28/2012  *RADIOLOGY REPORT*  Clinical Data: Hypoxemia.  CHEST - 2 VIEW  Comparison: 12/23/2012  Findings: Cardiomediastinal silhouette is stable. A left PICC line with tip overlying the mid SVC again noted. Bilateral airspace opacities are unchanged. There is no evidence of pneumothorax or large pleural effusion.  IMPRESSION: Stable chest radiograph with bilateral airspace opacities.   Original Report Authenticated By: Harmon Pier, M.D.    Images and medications reviewed  ASSESSMENT / PLAN: 1) Pulmonary infiltrates. - I don't get hx to suggest a second disease process, unless he has interstitial disease from prior aspiration. Doubt Usual Interstitial Pneumonia. No indication for acute inflammatory lung disease. Most likely this is residual pulmonary edema on underlying emphysema to give the patchy appearance. Acute lung injury from ruptured capillaries may contribute. Continued management as CAD/ CHF should allow the pulmonary pattern to reach a baseline over time. Outpatient 2V CXR in 2-3 weeks would be appropriate. His PCP may choose to refer for outpatient [facility] Pulmonary assessment if CXR fails to clear. We can see again PRN.  Terisa Starr, MD Pulmonary and Critical Care  Medicine [facility] [phone]  After HoursPager: [phone]  12/29/2012, 2:26 PM

## 2012-12-29 NOTE — Progress Notes (Signed)
Christopher [name]  79 y.o.  male  Subjective: Comfortable at rest; dyspnea with mild exertion; no chest pain.  Allergy: Review of patient's allergies indicates no known allergies.  Objective: Vital signs in last 24 hours: Temp:  [97.6 F (36.4 C)-98 F (36.7 C)] 98 F (36.7 C) (05/11 0430) Pulse Rate:  [64-70] 70 (05/11 0430) Resp:  [20] 20 (05/11 0430) BP: (113-130)/(56-66) 130/62 mmHg (05/11 0430) SpO2:  [95 %-96 %] 96 % (05/11 0430) Weight:  [79.3 kg (174 lb 13.2 oz)] 79.3 kg (174 lb 13.2 oz) (05/11 0430)  79.3 kg (174 lb 13.2 oz) Body mass index is 25.81 kg/(m^2).  Weight change: -0.7 kg (-1 lb 8.7 oz) Last BM Date: 12/25/12  Intake/Output from previous day: 05/10 0701 - 05/11 0700 In: 1200 [P.O.:1200] Out: 550 [Urine:550] Total I/O since admission: - 9.5 L; weight decreased to 27 pounds from peak on 5/4  General- Well developed; no acute distress ; mild wasting Neck- No JVD Lungs- clear lung fields; normal I:E ratio Cardiovascular- normal PMI; normal S1 and S2; modest systolic murmur at the left sternal border; no third heart sound Abdomen- normal bowel sounds; non-tender without masses or organomegaly; firm and mildly protuberant Skin- Warm, no significant lesions Extremities- Nl distal pulses; 1/2+ pretibial and presacral edema  Lab Results: CBC:   Recent Labs  12/27/12 0500 12/28/12 0500  WBC 13.1* 12.5*  HGB 12.0* 11.4*  HCT 36.8* 34.6*  PLT 342 349   BMET:  Recent Labs  12/27/12 0500 12/28/12 0500  NA 143 143  K 3.9 3.6  CL 108 108  CO2 25 24  GLUCOSE 112* 100*  BUN 24* 24*  CREATININE 0.93 0.91  CALCIUM 9.1 8.3*  GFR:  Estimated Creatinine Clearance: 62.6 ml/min (by C-G formula based on Cr of 0.91). Lipids:  Lipid Panel     Component Value Date/Time   CHOL 133 12/20/2012 0330   TRIG 106 12/20/2012 0330   HDL 24* 12/20/2012 0330   CHOLHDL 5.5 12/20/2012 0330   VLDL 21 12/20/2012 0330   LDLCALC 88 12/20/2012 0330   EKG: Tracing performed 12/25/12 reviewed:  Normal sinus rhythm; first-degree AV block; occasional PAC and PVC; minimal inferior Q waves with slight residual ST segment elevation; delayed R-wave progression-cannot exclude previous septal MI; ST T-wave abnormalities consistent with LVH or anterolateral ischemia; no previous tracing for comparison  LVEDP: 39 at cardiac catheterization on 12/20/2012  Imaging Studies/Results: Dg Chest 2 View  12/28/2012  *RADIOLOGY REPORT*  Clinical Data: Hypoxemia.  CHEST - 2 VIEW  Comparison: 12/23/2012  Findings: Cardiomediastinal silhouette is stable. A left PICC line with tip overlying the mid SVC again noted. Bilateral airspace opacities are unchanged. There is no evidence of pneumothorax or large pleural effusion.  IMPRESSION: Stable chest radiograph with bilateral airspace opacities.   Original Report Authenticated By: Harmon Pier, M.D.    Imaging: Imaging results have been reviewed-diffuse infiltrates; marked cardiomegaly; no other definite manifestations of pulmonary edema.  Medications:  I have reviewed the patient's current medications. Scheduled: . aspirin EC  81 mg Oral Daily  . atorvastatin  80 mg Oral q1800  . carvedilol  3.125 mg Oral BID WC  . ciprofloxacin  500 mg Oral BID  . clopidogrel  75 mg Oral Q breakfast  . enalapril  5 mg Oral BID  . enoxaparin (LOVENOX)  40 mg Subcutaneous Q24H    Assessment/Plan: ASCVD:  Acute MI with pulmonary edema and respiratory failure. Patient is improved on all fronts, but diffuse pulmonary infiltrates  persist without other evidence for fluid overload. This may represent acute lung injury related to initial pulmonary edema. Pulmonary consultation will be requested for further assistance.  Ischemic cardiomyopathy: Fairly marked first degree AV block; conduction disease will limit dose beta blockers. I will increase carvedilol and titrated as possible. Blood pressure tolerating increased ACE inhibitor. Spironolactone will be added to his medical regime.  BNP  was 5700 on 5/5-will repeat  UTI: Day 5 of antibiotics  Hypokalemia: Replace   LOS: 10 days   Barry Bing 12/29/2012, 7:51 AM

## 2012-12-30 ENCOUNTER — Inpatient Hospital Stay (HOSPITAL_COMMUNITY): Payer: Medicare Other

## 2012-12-30 DIAGNOSIS — R918 Other nonspecific abnormal finding of lung field: Secondary | ICD-10-CM

## 2012-12-30 LAB — CBC
Hemoglobin: 11 g/dL — ABNORMAL LOW (ref 13.0–17.0)
MCH: 27.8 pg (ref 26.0–34.0)
MCHC: 31.7 g/dL (ref 30.0–36.0)
RDW: 15.1 % (ref 11.5–15.5)

## 2012-12-30 LAB — COMPREHENSIVE METABOLIC PANEL
ALT: 26 U/L (ref 0–53)
Albumin: 2.1 g/dL — ABNORMAL LOW (ref 3.5–5.2)
Alkaline Phosphatase: 46 U/L (ref 39–117)
Calcium: 8.5 mg/dL (ref 8.4–10.5)
GFR calc Af Amer: >90 mL/min (ref >90)
Glucose, Bld: 99 mg/dL (ref 70–99)
Potassium: 4.1 mEq/L (ref 3.5–5.1)
Sodium: 143 mEq/L (ref 135–145)
Total Protein: 6.5 g/dL (ref 6.0–8.3)

## 2012-12-30 LAB — PRO B NATRIURETIC PEPTIDE: Pro B Natriuretic peptide (BNP): 1661 pg/mL — ABNORMAL HIGH (ref 0–450)

## 2012-12-30 MED ORDER — FUROSEMIDE 40 MG PO TABS
40.0000 mg | ORAL_TABLET | Freq: Every day | ORAL | Status: DC
  Administered 2012-12-30 – 2013-01-02 (×4): 40 mg via ORAL
  Filled 2012-12-30 (×4): qty 1

## 2012-12-30 NOTE — Progress Notes (Signed)
Physical medicine rehabilitation consult requested in chart reviewed. Patient was noted deconditioning multi-medical. Physical and occupational therapy evaluations completed and documented. Recommendations at this time are for home health therapies as patient to progress with overall functional mobility. Patient at this time does not meet medical necessity for inpatient rehabilitation services. Recommendations are for home health therapies.

## 2012-12-30 NOTE — Progress Notes (Signed)
PULMONARY  / CRITICAL CARE MEDICINE  Name: Christopher [name] MRN: [id number] DOB: 26-Nov-1927    ADMISSION DATE:  12/19/2012 CONSULTATION DATE:  12/29/2012  REFERRING MD :  [place] Bing  CHIEF COMPLAINT:  Dyspnea  BRIEF PATIENT DESCRIPTION:  79 yo male with remote hx of smoking with progressive exertional dyspnea, and chest pain and found to have NSTEMI with acute systolic heart failure and acute pulmonary edema.  Transferred from [facility] for further cardiac assessment.  Started on tx for UTI as outpt 12/16/12.  Had persistent dyspnea/hypoxia, and pulmonary infiltrates on CXR in spite of diuresis.  PCCM consulted to further assess.  Has hx of reflux with possible aspiration.  SIGNIFICANT EVENTS: 5/01 Transfer to [facility] from [facility] 5/06 Off lasix gtt, and milrinone 5/07 PTCA/DES x 1 to mid LAD  STUDIES:  5/02 Echo >> septal/apical/inferior wall hypokinesis, EF 25 to 30%, mild MR, mild LA/RA dilation 5/02 Lt heart cath >> severe two vessel CAD, elevated LV filling pressures  LINES / TUBES: Lt PICC 5/04 >>   CULTURES: 5/06 Urine >> negative  ANTIBIOTICS: 5/01 Cipro >>   SUBJECTIVE:  Breathing okay at rest.  Denies cough or sputum.  Denies chest pain.  Still requiring supplemental oxygen  VITAL SIGNS: Temp:  [97.6 F (36.4 C)-98.5 F (36.9 C)] 97.6 F (36.4 C) (05/12 0458) Pulse Rate:  [58-79] 77 (05/12 0458) Resp:  [20] 20 (05/12 0458) BP: (108-135)/(55-66) 135/56 mmHg (05/12 0458) SpO2:  [93 %-99 %] 99 % (05/12 0458) Weight:  [178 lb 1.6 oz (80.786 kg)] 178 lb 1.6 oz (80.786 kg) (05/12 0458) 3 liters [place]  PHYSICAL EXAMINATION: General:  No distress Neuro:  Alert, follows commands HEENT:  No sinus tenderness Cardiovascular:  s1s2 regular Lungs:  B/l coarse rales, no wheeze Abdomen:  Soft, non tender Musculoskeletal:  No edema Skin:  No rashes   Recent Labs Lab 12/28/12 0500 12/29/12 0500 12/30/12 0500  NA 143 143 143  K 3.6 3.4* 4.1  CL 108 108 111  CO2 24 23 24    BUN 24* 22 20  CREATININE 0.91 0.91 0.81  GLUCOSE 100* 85 99    Recent Labs Lab 12/28/12 0500 12/29/12 0500 12/30/12 0500  HGB 11.4* 11.0* 11.0*  HCT 34.6* 33.9* 34.7*  WBC 12.5* 10.5 10.0  PLT 349 374 389   Dg Chest 2 View  12/28/2012  *RADIOLOGY REPORT*  Clinical Data: Hypoxemia.  CHEST - 2 VIEW  Comparison: 12/23/2012  Findings: Cardiomediastinal silhouette is stable. A left PICC line with tip overlying the mid SVC again noted. Bilateral airspace opacities are unchanged. There is no evidence of pneumothorax or large pleural effusion.  IMPRESSION: Stable chest radiograph with bilateral airspace opacities.   Original Report Authenticated By: Harmon Pier, M.D.     ASSESSMENT / PLAN:  Acute hypoxic respiratory failure with persistent pulmonary infiltrates. Has negative 9 liters fluid balance from 5/04 to 5/11, but still requiring supplemental oxygen. Plan: - continue oxygen to keep SpO2 > 92% - f/u 2 view CXR 5/12 >> depending on results may need CT chest - ? Aspiration >> will consult speech for bedside swallow assessment, and arrange for barium esophagram - f/u BNP, ESR  Possible UTI. Urine cx from 5/06 negative. Plan: - D12/x cipro >> likely can d/c soon >> defer to primary team  NSTEMI with acute pulmonary edema and acute systolic CHF. Plan: - per cardiology - continue negative fluid balance as tolerated  Coralyn Helling, MD [facility] Pulmonary/Critical Care 12/30/2012, 11:06 AM Pager:  [phone] After  3pm call: [phone]

## 2012-12-30 NOTE — Progress Notes (Signed)
CARDIAC REHAB PHASE I   PRE:  Rate/Rhythm: 59 SB    BP: sitting 129/66    SaO2: 94 2L  MODE:  Ambulation: 160 ft   POST:  Rate/Rhythm: 68 SR    BP: sitting 136/66     SaO2: 87 4L, 87-88 6L then 94 6L after rest  Tolerated fairly well. Still requiring high levels O2. Rest x2. To recliner after walk. [id number]   Elissa Lovett [place] CES, ACSM 12/30/2012 3:30 PM

## 2012-12-30 NOTE — Progress Notes (Signed)
Physical Therapy Treatment Patient Details Name: Christopher [name] MRN: [id number] DOB: 1927/10/07 Today's Date: 12/30/2012 Time: 7846-9629 PT Time Calculation (min): 23 min  PT Assessment / Plan / Recommendation Comments on Treatment Session  Pt highly motivated.  Moves fairly well but is 02 dependent at this time.  Fatigues quickly however he was able to increase ambulation distance today.      Follow Up Recommendations  Home health PT;Supervision - Intermittent     Does the patient have the potential to tolerate intense rehabilitation     Barriers to Discharge        Equipment Recommendations   (rollator & home 02)    Recommendations for Other Services    Frequency Min 3X/week   Plan Discharge plan remains appropriate    Precautions / Restrictions Precautions Precautions: Fall Restrictions Weight Bearing Restrictions: No       Mobility  Bed Mobility Bed Mobility: Supine to Sit;Sitting - Scoot to Edge of Bed;Sit to Supine Supine to Sit: 6: Modified independent (Device/Increase time);With rails;HOB elevated Sitting - Scoot to Edge of Bed: 6: Modified independent (Device/Increase time) Sit to Supine: 6: Modified independent (Device/Increase time);HOB flat Details for Bed Mobility Assistance: No physical (A) required, only increased time.   Transfers Transfers: Sit to Stand;Stand to Sit Sit to Stand: With upper extremity assist;From bed;4: Min guard Stand to Sit: 5: Supervision;With upper extremity assist;To bed Details for Transfer Assistance: Cues for safest hand placement Ambulation/Gait Ambulation/Gait Assistance: 4: Min guard Ambulation Distance (Feet): 120 Feet Assistive device: Rolling walker Ambulation/Gait Assistance Details: required multiple standing rest breaks.  Cues for body positioning inside RW, pursed lip breathing, & safe use of RW.   Gait Pattern: Step-through pattern;Decreased step length - left;Decreased stride length;Decreased weight shift to right  (decreased floor clearance) General Gait Details: 02 sats dropped to 85% RA after 10'.  Placed on 2L 02 via [place].  02 sats 94% 2L at 40'.  Sats dropped to 84-86%% 2L at 32' & pt required 4L 02 to recover.  Ambulated rest of distance back to room at 4L 02.  Sats at 86% immediately returning to room.    Stairs: No    Exercises General Exercises - Lower Extremity Long Arc Quad: Both;10 reps;Seated Hip Flexion/Marching: Both;10 reps;Seated Toe Raises: Both;10 reps;Seated Heel Raises: Both;10 reps;Seated     PT Goals Acute Rehab PT Goals Time For Goal Achievement: 01/03/13 Potential to Achieve Goals: Good Pt will go Supine/Side to Sit: with modified independence PT Goal: Supine/Side to Sit - Progress: Progressing toward goal Pt will go Sit to Stand: with modified independence PT Goal: Sit to Stand - Progress: Progressing toward goal Pt will go Stand to Sit: with modified independence PT Goal: Stand to Sit - Progress: Progressing toward goal Pt will Ambulate: 51 - 150 feet;with modified independence;with rolling walker PT Goal: Ambulate - Progress: Progressing toward goal Pt will Go Up / Down Stairs: 3-5 stairs;with min assist;with least restrictive assistive device Pt will Perform Home Exercise Program: Independently PT Goal: Perform Home Exercise Program - Progress: Progressing toward goal  Visit Information  Last PT Received On: 12/30/12 Assistance Needed: +1    Subjective Data  Subjective: Oh good, Im glad youre here"   Cognition  Cognition Arousal/Alertness: Awake/alert Behavior During Therapy: WFL for tasks assessed/performed Overall Cognitive Status: Within Functional Limits for tasks assessed    Balance     End of Session PT - End of Session Equipment Utilized During Treatment: Gait belt;Oxygen Activity Tolerance: Patient tolerated treatment  well;Patient limited by fatigue Patient left: in bed;with call bell/phone within reach Nurse Communication: Mobility status      Verdell Face, [place] [phone] 12/30/2012

## 2012-12-30 NOTE — Progress Notes (Addendum)
Advanced Heart Failure Rounding Note   Subjective:   79 year old white male transferred from [facility] on 12/20/12 for evaluation of a non-ST elevation myocardial infarction and new onset congestive heart failure.  LHC 12/20/12 1. Severe two-vessel obstructive coronary artery disease. The right coronary is occluded with excellent collateral flow. There is a high-grade stenosis in the LAD immediately following the first diagonal.  2. Elevated left ventricular filling pressures  Lasix drip and Milrinone stopped on 12/24/12.    12/25/12 Successful PTCA/DES x 1 mid LAD.   Weight up 4 pounds overnight, weight down 19 pounds overall.  Complains of dizziness with standing.  Denies SOB/PND/Orthopena.   CXR with persistent diffuse pulmonary infiltrates despite aggressive diuresis. Pulmonary consult noted.  Echo EF 25-30%   Objective:   Weight Range:  Vital Signs:   Temp:  [97.6 F (36.4 C)-98.5 F (36.9 C)] 97.6 F (36.4 C) (05/12 0458) Pulse Rate:  [58-79] 77 (05/12 0458) Resp:  [20] 20 (05/12 0458) BP: (108-135)/(55-66) 135/56 mmHg (05/12 0458) SpO2:  [93 %-99 %] 99 % (05/12 0458) Weight:  [178 lb 1.6 oz (80.786 kg)] 178 lb 1.6 oz (80.786 kg) (05/12 0458) Last BM Date: 12/25/12  Weight change: Filed Weights   12/28/12 0622 12/29/12 0430 12/30/12 0458  Weight: 176 lb 5.9 oz (80 kg) 174 lb 13.2 oz (79.3 kg) 178 lb 1.6 oz (80.786 kg)    Intake/Output:   Intake/Output Summary (Last 24 hours) at 12/30/12 0751 Last data filed at 12/29/12 2300  Gross per 24 hour  Intake   1200 ml  Output   1250 ml  Net    -50 ml     Physical Exam:  General:  Elderly frail appearing. No resp difficulty. Sitting in chair.  HEENT: normal Neck: supple. JVP 5-6. Carotids 2+ bilat; no bruits. No lymphadenopathy or thryomegaly appreciated. Cor: PMI nondisplaced. Regular rate & rhythm. No rubs, gallops or murmurs. Lungs: clear Abdomen: soft, nontender, mild distention. No hepatosplenomegaly. No  bruits or masses. Good bowel sounds.  Extremities: no cyanosis, clubbing, rash, edema. . Neuro: alert & orientedx3, cranial nerves grossly intact. moves all 4 extremities w/o difficulty. Affect pleasant  Telemetry:  SR 76 pbm  Labs: Basic Metabolic Panel:  Recent Labs Lab 12/26/12 0300 12/27/12 0500 12/28/12 0500 12/29/12 0500 12/30/12 0500  NA 143 143 143 143 143  K 4.2 3.9 3.6 3.4* 4.1  CL 108 108 108 108 111  CO2 26 25 24 23 24   GLUCOSE 98 112* 100* 85 99  BUN 24* 24* 24* 22 20  CREATININE 0.88 0.93 0.91 0.91 0.81  CALCIUM 8.7 9.1 8.3* 8.6 8.5    Liver Function Tests:  Recent Labs Lab 12/30/12 0500  AST 29  ALT 26  ALKPHOS 46  BILITOT 0.5  PROT 6.5  ALBUMIN 2.1*   No results found for this basename: LIPASE, AMYLASE,  in the last 168 hours No results found for this basename: AMMONIA,  in the last 168 hours  CBC:  Recent Labs Lab 12/26/12 0300 12/27/12 0500 12/28/12 0500 12/29/12 0500 12/30/12 0500  WBC 11.2* 13.1* 12.5* 10.5 10.0  HGB 12.4* 12.0* 11.4* 11.0* 11.0*  HCT 35.7* 36.8* 34.6* 33.9* 34.7*  MCV 84.6 86.8 86.7 87.6 87.8  PLT 363 342 349 374 389    Cardiac Enzymes: No results found for this basename: CKTOTAL, CKMB, CKMBINDEX, TROPONINI,  in the last 168 hours  BNP: BNP (last 3 results)  Recent Labs  12/22/12 1130 12/23/12 0500 12/30/12 0500  PROBNP 7018.0*  5717.0* 1661.0*     Other results:   Imaging: Dg Chest 2 View  12/28/2012  *RADIOLOGY REPORT*  Clinical Data: Hypoxemia.  CHEST - 2 VIEW  Comparison: 12/23/2012  Findings: Cardiomediastinal silhouette is stable. A left PICC line with tip overlying the mid SVC again noted. Bilateral airspace opacities are unchanged. There is no evidence of pneumothorax or large pleural effusion.  IMPRESSION: Stable chest radiograph with bilateral airspace opacities.   Original Report Authenticated By: Harmon Pier, M.D.      Medications:     Scheduled Medications: . aspirin EC  81 mg Oral  Daily  . atorvastatin  80 mg Oral q1800  . carvedilol  6.25 mg Oral BID WC  . ciprofloxacin  500 mg Oral BID  . clopidogrel  75 mg Oral Q breakfast  . enalapril  5 mg Oral BID  . enoxaparin (LOVENOX) injection  40 mg Subcutaneous Q24H  . sodium chloride  10-40 mL Intracatheter Q12H  . sodium chloride  3 mL Intravenous Q12H  . spironolactone  25 mg Oral Daily    Infusions: . sodium chloride 10 mL/hr at 12/22/12 0700    PRN Medications: sodium chloride, acetaminophen, albuterol, ondansetron (ZOFRAN) IV, sodium chloride, sodium chloride   Assessment:   1. NSTEMI - 2 vessel CAD 2. ICM- EF 25%  3. Chronic Renal Failure 4. UTI 5. S/P Successful PTCA/DES x 1 mid LAD 6. Acute on chronic respiratory failure 7. Severe deconditioning 8. Acute systolic heart failure  Plan/Discussion:    Weight is going up overnight.  Will restart low dose lasix today.  Continue ACE-I and beta blocker as well as spiro.    Appreciate pulmonary input.  May require outpatient follow up.  Will continue to work with rehab.  Not candidate for CIR but will need HHPT.    Continue Plavix for recent LAD pci. Hopefully EF will recover post PCI  Robbi Garter, [facility] 7:51 AM  Patient seen and examined with Ulyess Blossom, PA-C. We discussed all aspects of the encounter. I agree with the assessment and plan as stated above.   Remains on 6L [place]. Weight up 4 pounds but still not that volume overload. When he left ICU CVP was < 5 so doubt he is markedly volume overload despite CXR. Will restart lasix 40 daily.   Pulmonary consult noted. I agree this may be acute lung injury due to pulmonary capillary fracture. Will ask pulmonary if they have any further recs. Is there any role for short course of steroids?   Continue rehab.  Daniel Bensimhon,MD 9:11 AM

## 2012-12-30 NOTE — Progress Notes (Signed)
SATURATION QUALIFICATIONS: (This note is used to comply with regulatory documentation for home oxygen)  Patient Saturations on Room Air at Rest = 92%  Patient Saturations on Room Air while Ambulating = 85%   Patient Saturations on 2 Liters of oxygen while Ambulating = 94% at 40';  84-86% at ~75'  Patient Saturations on 4 Liters of oxygen while Ambulating = 87%   Please briefly explain why patient needs home oxygen:  Pt Sa02 decreases when on RA quickly and needs several minutes to increase [address]    Verdell Face, [place] [phone] 12/30/2012 .

## 2012-12-30 NOTE — Progress Notes (Signed)
Occupational Therapy Treatment Patient Details Name: Christopher [name] MRN: [id number] DOB: Aug 18, 1928 Today's Date: 12/30/2012 Time: 4098-1191 OT Time Calculation (min): 21 min  OT Assessment / Plan / Recommendation Comments on Treatment Session This 79 yo male making progress, will benefit from [facility].    Follow Up Recommendations  Home health OT       Equipment Recommendations   (rollator)       Frequency Min 2X/week   Plan Discharge plan remains appropriate    Precautions / Restrictions Precautions Precautions: Fall Precaution Comments: O2 drops even on O2 Restrictions Weight Bearing Restrictions: No   Pertinent Vitals/Pain Sats drop even on O2 (85% on 2 liters when he got to the bathroom from his bed, came up to 88% with purse lipped breathing, turned up to 3 liters to get up 91% before ambulating back to bed)    ADL  Grooming: Performed;Wash/dry hands;Wash/dry face;Supervision/safety Where Assessed - Grooming: Unsupported standing Toilet Transfer: Performed;Supervision/safety Statistician Method: Sit to Barista: Regular height toilet;Grab bars Toileting - Clothing Manipulation and Hygiene: Performed;Modified independent Where Assessed - Toileting Clothing Manipulation and Hygiene: Sit to stand from 3-in-1 or toilet ADL Comments: Re-educated pt on purse lipped breathing and that he should do this 5-10 times after every transition movement. He needed Vc's to do this, even after I had reminded him while I was working with him the room. Educated pt on energy conservation handout, high lighting points that are really pertient to him (sitting to do tasks as much as he can, trying not to do too much).  He verbalized understanding     OT Goals Miscellaneous OT Goals OT Goal: Miscellaneous Goal #1 - Progress: Progressing toward goals OT Goal: Miscellaneous Goal #2 - Progress: Progressing toward goals  Visit Information  Last OT Received On:  12/30/12 Assistance Needed: +1          Cognition  Cognition Arousal/Alertness: Awake/alert Behavior During Therapy: WFL for tasks assessed/performed Overall Cognitive Status: Within Functional Limits for tasks assessed (decreased memory for purse lipped breathing)    Mobility  Bed Mobility Bed Mobility: Supine to Sit;Sitting - Scoot to Edge of Bed;Sit to Supine Supine to Sit: 6: Modified independent (Device/Increase time);With rails Sitting - Scoot to Edge of Bed: 6: Modified independent (Device/Increase time) Sit to Supine: With rail;6: Modified independent (Device/Increase time) Transfers Transfers: Sit to Stand;Stand to Sit Sit to Stand: 5: Supervision;With upper extremity assist;From bed Stand to Sit: 5: Supervision;With upper extremity assist;To bed Details for Transfer Assistance: VCs for safe hand placement          End of Session OT - End of Session Activity Tolerance:  (drop in sats with activity even on O2) Patient left: in bed;with call bell/phone within reach;with bed alarm set       Evette Georges [phone] 12/30/2012, 2:35 PM

## 2012-12-31 ENCOUNTER — Inpatient Hospital Stay (HOSPITAL_COMMUNITY): Payer: Medicare Other

## 2012-12-31 LAB — CBC
HCT: 34 % — ABNORMAL LOW (ref 39.0–52.0)
Hemoglobin: 10.9 g/dL — ABNORMAL LOW (ref 13.0–17.0)
MCH: 28.2 pg (ref 26.0–34.0)
RBC: 3.86 MIL/uL — ABNORMAL LOW (ref 4.22–5.81)

## 2012-12-31 LAB — BASIC METABOLIC PANEL
CO2: 26 mEq/L (ref 19–32)
Chloride: 107 mEq/L (ref 96–112)
GFR calc Af Amer: 87 mL/min — ABNORMAL LOW (ref >90)
Potassium: 3.9 mEq/L (ref 3.5–5.1)

## 2012-12-31 LAB — SEDIMENTATION RATE: Sed Rate: 103 mm/hr — ABNORMAL HIGH (ref 0–16)

## 2012-12-31 LAB — PRO B NATRIURETIC PEPTIDE: Pro B Natriuretic peptide (BNP): 1836 pg/mL — ABNORMAL HIGH (ref 0–450)

## 2012-12-31 MED ORDER — METHYLPREDNISOLONE SODIUM SUCC 125 MG IJ SOLR
60.0000 mg | Freq: Four times a day (QID) | INTRAMUSCULAR | Status: DC
  Administered 2012-12-31 (×2): 60 mg via INTRAVENOUS
  Administered 2013-01-01: 10:00:00 via INTRAVENOUS
  Administered 2013-01-01 – 2013-01-02 (×4): 60 mg via INTRAVENOUS
  Filled 2012-12-31 (×11): qty 0.96

## 2012-12-31 NOTE — Evaluation (Signed)
Clinical/Bedside Swallow Evaluation Patient Details  Name: Dayshon Roback MRN: [id number] Date of Birth: [birth year]  Today's Date: 12/31/2012 Time: 0815-0827 SLP Time Calculation (min): 12 min  Past Medical History:  Past Medical History  Diagnosis Date  . Hypertension   . BPH (benign prostatic hyperplasia)   . Hemorrhoids    Past Surgical History: History reviewed. No pertinent past surgical history. HPI:  79 y/o male without prior cardiac history. He was in his usual state of health until approximately one month ago when he began to experience exertional substernal chest pressure associated with dyspnea, lasting 5-10 minutes, and resolving with rest. Symptoms occur most days of the week or on any day that he tries to exert himself above a particular level. Over the past week however, exertional chest pressure and dyspnea have been occurring with less and less activity and have even occurred with rest. Last night, he was awakened abruptly at approximately 1 AM with recurrent chest pressure and dyspnea. he says he sat and relaxed for a little while but symptoms never really improved and he subsequently presented to [facility] hospital this morning. There, his ECG showed an irregular rhythm with frequent PACs and PVCs. Chest x-ray suggested pulmonary edema and lab work returned with a troponin of 5.30, and CK-MB of 49.5. PMH:  NTN, BPH.  MD note states hx of reflux with possible aspiration.CXR revealed no change in bilateral infiltrates   Assessment / Plan / Recommendation Clinical Impression  Pt. exhibited normal oropharyngeal swallow function.  Do not suspect aspiration during the swallow of liquids or solids.  Pt. states he sometimes wakes up at night coughing and is aware he cannot eat late at night.  It is possible that he could have aspirated esophageal stasis.  Esophagram performed yesterday and results not dictated as of now.  Intervention included education of esophageal precautions.   Recommend pt. continue regular diet and thin liquids, straws ok and pills whole in applesauce.  Encouraged pt. to discuss if meds for GERD would be beneficial with MD.  No f/u needed at this time.     Aspiration Risk  Mild    Diet Recommendation Regular;Thin liquid   Liquid Administration via: Straw;Cup Medication Administration: Whole meds with liquid Supervision: Patient able to self feed Compensations: Slow rate;Small sips/bites Postural Changes and/or Swallow Maneuvers: Seated upright 90 degrees;Upright 30-60 min after meal    Other  Recommendations Oral Care Recommendations: Oral care BID   Follow Up Recommendations  None    Frequency and Duration        Pertinent Vitals/Pain none         Swallow Study         Oral/Motor/Sensory Function Overall Oral Motor/Sensory Function: Appears within functional limits for tasks assessed   Ice Chips Ice chips: Not tested   Thin Liquid Thin Liquid: Within functional limits Presentation: Cup;Straw    Nectar Thick Nectar Thick Liquid: Not tested   Honey Thick Honey Thick Liquid: Not tested   Puree Puree: Within functional limits   Solid       Solid: Within functional limits       Royce Macadamia M.Ed [employer] [phone]  12/31/2012

## 2012-12-31 NOTE — Progress Notes (Addendum)
Advanced Heart Failure Rounding Note   Subjective:   79 year old white male transferred from [facility] on 12/20/12 for evaluation of a non-ST elevation myocardial infarction and new onset congestive heart failure.  LHC 12/20/12 1. Severe two-vessel obstructive coronary artery disease. The right coronary is occluded with excellent collateral flow. There is a high-grade stenosis in the LAD immediately following the first diagonal.  2. Elevated left ventricular filling pressures  Lasix drip and Milrinone stopped on 12/24/12.    12/25/12 Successful PTCA/DES x 1 mid LAD.   CXR with persistent diffuse pulmonary infiltrates despite aggressive diuresis. Pulmonary consult noted. Echo EF 25-30%  Esophagram ordered to evaluate aspiration.  Feels well.  Denies dyspnea, orthopnea or PND.  No chest pain.   Objective:   Weight Range:  Vital Signs:   Temp:  [97.4 F (36.3 C)-98.1 F (36.7 C)] 98.1 F (36.7 C) (05/13 0458) Pulse Rate:  [57-62] 57 (05/13 0458) Resp:  [18-20] 18 (05/13 0458) BP: (125-135)/(54-63) 135/54 mmHg (05/13 0458) SpO2:  [92 %-95 %] 95 % (05/13 0458) Weight:  [175 lb 14.4 oz (79.788 kg)] 175 lb 14.4 oz (79.788 kg) (05/13 0458) Last BM Date: 12/31/12  Weight change: Filed Weights   12/29/12 0430 12/30/12 0458 12/31/12 0458  Weight: 174 lb 13.2 oz (79.3 kg) 178 lb 1.6 oz (80.786 kg) 175 lb 14.4 oz (79.788 kg)    Intake/Output:   Intake/Output Summary (Last 24 hours) at 12/31/12 1114 Last data filed at 12/31/12 0900  Gross per 24 hour  Intake    580 ml  Output    300 ml  Net    280 ml     Physical Exam:  General:  Elderly frail appearing. No resp difficulty. Sitting in chair.  HEENT: normal Neck: supple. JVP 5-6. Carotids 2+ bilat; no bruits. No lymphadenopathy or thryomegaly appreciated. Cor: PMI nondisplaced. Regular rate & rhythm. No rubs, gallops or murmurs. Lungs: Dry crackles throughout.  Abdomen: soft, nontender, no distention. No hepatosplenomegaly.  No bruits or masses. Good bowel sounds.  Extremities: no cyanosis, clubbing, rash, edema. . Neuro: alert & orientedx3, cranial nerves grossly intact. moves all 4 extremities w/o difficulty. Affect pleasant  Telemetry:  SR 76 pbm  Labs: Basic Metabolic Panel:  Recent Labs Lab 12/26/12 0300 12/27/12 0500 12/28/12 0500 12/29/12 0500 12/30/12 0500  NA 143 143 143 143 143  K 4.2 3.9 3.6 3.4* 4.1  CL 108 108 108 108 111  CO2 26 25 24 23 24   GLUCOSE 98 112* 100* 85 99  BUN 24* 24* 24* 22 20  CREATININE 0.88 0.93 0.91 0.91 0.81  CALCIUM 8.7 9.1 8.3* 8.6 8.5    Liver Function Tests:  Recent Labs Lab 12/30/12 0500  AST 29  ALT 26  ALKPHOS 46  BILITOT 0.5  PROT 6.5  ALBUMIN 2.1*   No results found for this basename: LIPASE, AMYLASE,  in the last 168 hours No results found for this basename: AMMONIA,  in the last 168 hours  CBC:  Recent Labs Lab 12/27/12 0500 12/28/12 0500 12/29/12 0500 12/30/12 0500 12/31/12 0544  WBC 13.1* 12.5* 10.5 10.0 8.7  HGB 12.0* 11.4* 11.0* 11.0* 10.9*  HCT 36.8* 34.6* 33.9* 34.7* 34.0*  MCV 86.8 86.7 87.6 87.8 88.1  PLT 342 349 374 389 413*    Cardiac Enzymes: No results found for this basename: CKTOTAL, CKMB, CKMBINDEX, TROPONINI,  in the last 168 hours  BNP: BNP (last 3 results)  Recent Labs  12/23/12 0500 12/30/12 0500 12/31/12 0435  PROBNP 5717.0* 1661.0* 1836.0*     Other results:   Imaging: Dg Chest 2 View  12/30/2012  *RADIOLOGY REPORT*  Clinical Data: Follow up pulmonary infiltrates  CHEST - 2 VIEW  Comparison: 12/28/2012  Findings: Cardiac shadow is stable.  Bilateral parenchymal infiltrates are seen throughout both lungs and relatively stable given some variation and the technical aspects of the film.  No sizable effusion is noted.  No other focal abnormality is seen.  IMPRESSION: No change in bilateral infiltrates.   Original Report Authenticated By: Alcide Clever, M.D.      Medications:     Scheduled  Medications: . aspirin EC  81 mg Oral Daily  . atorvastatin  80 mg Oral q1800  . carvedilol  6.25 mg Oral BID WC  . clopidogrel  75 mg Oral Q breakfast  . enalapril  5 mg Oral BID  . enoxaparin (LOVENOX) injection  40 mg Subcutaneous Q24H  . furosemide  40 mg Oral Daily  . sodium chloride  10-40 mL Intracatheter Q12H  . sodium chloride  3 mL Intravenous Q12H  . spironolactone  25 mg Oral Daily    Infusions: . sodium chloride 10 mL/hr at 12/22/12 0700    PRN Medications: sodium chloride, acetaminophen, albuterol, ondansetron (ZOFRAN) IV, sodium chloride, sodium chloride   Assessment:   1. NSTEMI - 2 vessel CAD 2. ICM- EF 25%  3. Chronic Renal Failure 4. UTI 5. S/P Successful PTCA/DES x 1 mid LAD 6. Acute on chronic respiratory failure 7. Severe deconditioning 8. Acute systolic heart failure  Plan/Discussion:    Volume status improving with addition of low dose lasix.  Await BMET to follow renal function.  O2 requirement improving, now on 3L.  Continue ACE-I and beta blocker as well as spiro.    Appreciate pulmonary input. Await swallow study.      Continue Plavix for recent LAD pci. Hopefully EF will recover post PCI  Dispo: home with HH in the next 24-48 hours.  Robbi Garter, [facility] 11:14 AM  Patient seen and examined with Ulyess Blossom, PA-C. We discussed all aspects of the encounter. I agree with the assessment and plan as stated above. Looks very good from HF standpoint. Volume status well compensated. Main issue now is persistent pulmonary infiltrates and exertional hypoxemia. Pulmonary suggesting CT and I think this is good idea. ESR is also up significantly and raises possibility of pneumonitis. Will d/w them. If stable would like to be able to d/c him home tomorrow with home O2 and close f/u with [place] and Pulmonary. Will need cardiac rehab.  Dashauna Heymann,MD 1:00 PM

## 2012-12-31 NOTE — Progress Notes (Signed)
Utilization Review Completed.   Abishai Viegas, RN, BSN Nurse Case Manager  [phone]  

## 2012-12-31 NOTE — Progress Notes (Signed)
PULMONARY  / CRITICAL CARE MEDICINE  Name: Christopher [name] MRN: [id number] DOB: 1927/11/14    ADMISSION DATE:  12/19/2012 CONSULTATION DATE:  12/29/2012  REFERRING MD :  [place] [facility]  CHIEF COMPLAINT:  Dyspnea  BRIEF PATIENT DESCRIPTION:  79 y/o male with remote hx of smoking with progressive exertional dyspnea, and chest pain and found to have NSTEMI with acute systolic heart failure and acute pulmonary edema.  Transferred from [facility] for further cardiac assessment.  Started on tx for UTI as outpt 12/16/12.  Had persistent dyspnea/hypoxia, and pulmonary infiltrates on CXR in spite of diuresis.  PCCM consulted to further assess.  Has hx of reflux with possible aspiration.  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    SIGNIFICANT EVENTS: 5/01 Transfer to [facility] from [facility] 5/06 Off lasix gtt, and milrinone 5/07 PTCA/DES x 1 to mid LAD 5/13 ESR 103, start trial of solumedrol  STUDIES:  5/02 Echo >> septal/apical/inferior wall hypokinesis, EF 25 to 30%, mild MR, mild LA/RA dilation 5/02 Lt heart cath >> severe two vessel CAD, elevated LV filling pressures 5/12 Esophagram >> 5/12 Speech therapy swallow evaluation >> normal swallow function  LINES / TUBES: Lt PICC 5/04 >>   CULTURES: 5/06 Urine >> negative  ANTIBIOTICS: 5/01 Cipro >>5/12  SUBJECTIVE:  Pt denies SOB, cough, sputum production.   VITAL SIGNS: Temp:  [97.4 F (36.3 C)-98.1 F (36.7 C)] 98.1 F (36.7 C) (05/13 0458) Pulse Rate:  [57-62] 57 (05/13 0458) Resp:  [18-20] 18 (05/13 0458) BP: (125-135)/(54-63) 135/54 mmHg (05/13 0458) SpO2:  [92 %-95 %] 95 % (05/13 0458) Weight:  [175 lb 14.4 oz (79.788 kg)] 175 lb 14.4 oz (79.788 kg) (05/13 0458) 2 liters [place]  PHYSICAL EXAMINATION: General:  No distress Neuro:  Alert, follows commands HEENT:  No sinus tenderness Cardiovascular:  s1s2  regular Lungs:  B/l wet crackles, no wheeze Abdomen:  Soft, non tender Musculoskeletal:  No edema Skin:  No rashes   Recent Labs Lab 12/28/12 0500 12/29/12 0500 12/30/12 0500  NA 143 143 143  K 3.6 3.4* 4.1  CL 108 108 111  CO2 24 23 24   BUN 24* 22 20  CREATININE 0.91 0.91 0.81  GLUCOSE 100* 85 99    Recent Labs Lab 12/29/12 0500 12/30/12 0500 12/31/12 0544  HGB 11.0* 11.0* 10.9*  HCT 33.9* 34.7* 34.0*  WBC 10.5 10.0 8.7  PLT 374 389 413*   Dg Chest 2 View  12/30/2012  *RADIOLOGY REPORT*  Clinical Data: Follow up pulmonary infiltrates  CHEST - 2 VIEW  Comparison: 12/28/2012  Findings: Cardiac shadow is stable.  Bilateral parenchymal infiltrates are seen throughout both lungs and relatively stable given some variation and the technical aspects of the film.  No sizable effusion is noted.  No other focal abnormality is seen.  IMPRESSION: No change in bilateral infiltrates.   Original Report Authenticated By: Alcide Clever, M.D.     ASSESSMENT / PLAN:  Acute hypoxic respiratory failure with persistent pulmonary infiltrates. Negative 9 liters fluid balance from 5/04 to 5/11, but still requiring supplemental oxygen.  ESR 103 from 5/13. Plan: - continue oxygen to keep SpO2 > 92% - f/u CT chest 5/13 - Question of Aspiration.  f/u esophagram from 5/12 - check ANA, ANCA, SCL-70, RF - start solumedrol trial 5/13  NSTEMI with acute pulmonary edema and acute systolic CHF. Plan: - per cardiology -  continue negative fluid balance as tolerated   Canary Brim, NP-C [facility] Pgr: [phone] or [phone]  Discussed plan with Dr. Jones Broom.  Coralyn Helling, MD [facility] Pulmonary/Critical Care 12/31/2012, 2:08 PM Pager:  [phone] After 3pm call: [phone]

## 2013-01-01 ENCOUNTER — Inpatient Hospital Stay (HOSPITAL_COMMUNITY): Payer: Medicare Other

## 2013-01-01 DIAGNOSIS — J189 Pneumonia, unspecified organism: Secondary | ICD-10-CM | POA: Diagnosis not present

## 2013-01-01 DIAGNOSIS — K224 Dyskinesia of esophagus: Secondary | ICD-10-CM

## 2013-01-01 DIAGNOSIS — R911 Solitary pulmonary nodule: Secondary | ICD-10-CM

## 2013-01-01 DIAGNOSIS — J8410 Pulmonary fibrosis, unspecified: Secondary | ICD-10-CM

## 2013-01-01 LAB — CBC
MCH: 28.3 pg (ref 26.0–34.0)
MCHC: 32.3 g/dL (ref 30.0–36.0)
MCV: 87.5 fL (ref 78.0–100.0)
Platelets: 402 10*3/uL — ABNORMAL HIGH (ref 150–400)
RDW: 15.1 % (ref 11.5–15.5)

## 2013-01-01 LAB — RHEUMATOID FACTOR: Rhuematoid fact SerPl-aCnc: <10 IU/mL (ref <=14)

## 2013-01-01 LAB — BASIC METABOLIC PANEL
Calcium: 8.6 mg/dL (ref 8.4–10.5)
Creatinine, Ser: 0.74 mg/dL (ref 0.50–1.35)
GFR calc Af Amer: >90 mL/min (ref >90)
GFR calc non Af Amer: 84 mL/min — ABNORMAL LOW (ref >90)

## 2013-01-01 LAB — ANA: Anti Nuclear Antibody(ANA): NEGATIVE

## 2013-01-01 LAB — PULMONARY FUNCTION TEST

## 2013-01-01 MED ORDER — ALTEPLASE 2 MG IJ SOLR
2.0000 mg | Freq: Once | INTRAMUSCULAR | Status: AC
  Administered 2013-01-01: 2 mg
  Filled 2013-01-01: qty 2

## 2013-01-01 MED ORDER — PANTOPRAZOLE SODIUM 40 MG PO TBEC
40.0000 mg | DELAYED_RELEASE_TABLET | Freq: Every day | ORAL | Status: DC
  Administered 2013-01-01 – 2013-01-02 (×2): 40 mg via ORAL
  Filled 2013-01-01: qty 1

## 2013-01-01 NOTE — Progress Notes (Signed)
PULMONARY  / CRITICAL CARE MEDICINE  Name: Christopher [name] MRN: [id number] DOB: 08/10/1928    ADMISSION DATE:  12/19/2012 CONSULTATION DATE:  12/29/2012  REFERRING MD :  [place] Bing  CHIEF COMPLAINT:  Dyspnea  BRIEF PATIENT DESCRIPTION:  79 y/o male with remote hx of smoking with progressive exertional dyspnea, and chest pain and found to have NSTEMI with acute systolic heart failure and acute pulmonary edema.  Transferred from [facility] for further cardiac assessment.  Started on tx for UTI as outpt 12/16/12.  Had persistent dyspnea/hypoxia, and pulmonary infiltrates on CXR in spite of diuresis.  PCCM consulted to further assess.  Has hx of reflux with possible aspiration.  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    SIGNIFICANT EVENTS: 5/01 Transfer to [facility] from [facility] 5/06 Off lasix gtt, and milrinone 5/07 PTCA/DES x 1 to mid LAD 5/13 ESR 103, start trial of solumedrol  STUDIES:  5/02 Echo >> septal/apical/inferior wall hypokinesis, EF 25 to 30%, mild MR, mild LA/RA dilation 5/02 Lt heart cath >> severe two vessel CAD, elevated LV filling pressures 5/12 Esophagram >> disruption of primary peristalsis consistent with nonspecific esophageal motility disorder 5/12 Speech therapy swallow evaluation >> normal swallow function 5/13 CT chest >> diffuse peripheral interstitial reticulation, lower lobe predominant honeycombing, patchy areas of GGO b/l, 8 mm RLL nodule, borderline LAN up to 1 cm, 2.5 cm Rt thyroid nodule  LINES / TUBES: Lt PICC 5/04 >>   CULTURES: 5/06 Urine >> negative  ANTIBIOTICS: 5/01 Cipro >>5/12  SUBJECTIVE:  Feelings breathing.  Denies cough, sputum, chest pain.  VITAL SIGNS: Temp:  [97.9 F (36.6 C)-98 F (36.7 C)] 97.9 F (36.6 C) (05/13 2121) Pulse Rate:  [58-66] 66 (05/14 0938) Resp:  [18] 18 (05/13 2121) BP:  (119-138)/(62-63) 119/63 mmHg (05/14 0938) SpO2:  [93 %-96 %] 93 % (05/13 2121) 3 liters [place]  PHYSICAL EXAMINATION: General:  No distress Neuro:  Alert, follows commands HEENT:  No sinus tenderness Cardiovascular:  s1s2 regular Lungs:  B/l crackles, no wheeze Abdomen:  Soft, non tender Musculoskeletal:  No edema, no clubbing Skin:  No rashes   Recent Labs Lab 12/30/12 0500 12/31/12 1120 01/01/13 0500  NA 143 141 144  K 4.1 3.9 3.9  CL 111 107 108  CO2 24 26 26   BUN 20 16 17   CREATININE 0.81 0.96 0.74  GLUCOSE 99 141* 145*    Recent Labs Lab 12/30/12 0500 12/31/12 0544 01/01/13 0500  HGB 11.0* 10.9* 11.8*  HCT 34.7* 34.0* 36.5*  WBC 10.0 8.7 5.2  PLT 389 413* 402*   Dg Chest 2 View  12/30/2012   *RADIOLOGY REPORT*  Clinical Data: Follow up pulmonary infiltrates  CHEST - 2 VIEW  Comparison: 12/28/2012  Findings: Cardiac shadow is stable.  Bilateral parenchymal infiltrates are seen throughout both lungs and relatively stable given some variation and the technical aspects of the film.  No sizable effusion is noted.  No other focal abnormality is seen.  IMPRESSION: No change in bilateral infiltrates.   Original Report Authenticated By: Alcide Clever, M.D.   Ct Chest Wo Contrast  12/31/2012   *RADIOLOGY REPORT*  Clinical Data: Evaluate pulmonary infiltrates.  CT CHEST WITHOUT CONTRAST  Technique:  Multidetector CT imaging of the chest was performed following the standard protocol without IV contrast.  Comparison: 12/30/2012  Findings: Very small right pleural effusion is noted.  Diffuse  peripheral interstitial reticulation is identified.  There are areas of traction bronchiectasis noted bilaterally.  Lower lobe predominant peripheral honeycombing is identified.  This is most notable at the left lung base.  Patchy areas of ground-glass attenuation are noted bilaterally.  There is a pulmonary nodule within the superior segment of right lower lobe which measures 8 mm, image number  36/series 3.  Mild cardiac enlargement.  No pericardial effusion.  Multiple, mediastinal and hilar lymph nodes are noted.  Index prevascular lymph node measures 1 cm,, image 28/series 2.  9 mm subcarinal lymph node is identified, image 32/series 2.  There is a 2.5 cm low attenuation nodule within the right lobe of thyroid gland.  Limited imaging through the upper abdomen shows a low attenuation structure within the central liver measuring 6 mm, image number five/series 2.  This is too small to characterize.  Pneumobilia is identified consistent with biliary patency.  The adrenal glands both appear normal.  Review of the visualized osseous structures is significant for mild multilevel spondylosis.  No aggressive lytic or sclerotic bone lesions identified.  IMPRESSION:  1.  Spectrum of the pulmonary findings are concerning for usual interstitial pneumonitis (UIP).  Consider further evaluation with high-resolution CT the chest and pulmonary function tests. 2.  Prominent mediastinal lymph nodes are nonspecific in the setting of interstitial lung disease. 3.  Pulmonary nodule in the right lower lobe measures 8 mm. If the patient is at high risk for bronchogenic carcinoma, follow-up chest CT at 3-6 months is recommended.  If the patient is at low risk for bronchogenic carcinoma, follow-up chest CT at 6-12 months is recommended.  This recommendation follows the consensus statement: Guidelines for Management of Small Pulmonary Nodules Detected on CT Scans: A Statement from the Fleischner Society as published in Radiology 2005; 237:395-400.   Original Report Authenticated By: Signa Kell, M.D.   Dg Esophagus  01/01/2013   *RADIOLOGY REPORT*  Clinical Data:Reflux  ESOPHAGUS/BARIUM SWALLOW/TABLET STUDY  Fluoroscopy Time: 3 minutes and 3 seconds  Comparison: None.  Findings: Single contrast imaging of the esophagus shows no esophageal stricture.  No esophageal diverticulum.  No constricting mass lesion.  There is  disruption of primary peristalsis consistent with nonspecific esophageal motility disorder.  13 mm barium tablet passes readily into the stomach when taken with water.  IMPRESSION: No evidence for esophageal mass lesion or stricture.   Original Report Authenticated By: Kennith Center, M.D.    ASSESSMENT / PLAN:  Acute hypoxic respiratory failure with persistent pulmonary infiltrates >> CT chest findings consistent with UIP pattern with areas of GGO.  Most suggestive of pulmonary fibrosis with acute pneumonitis.  No old CXR's for comparison. ESR 103, RF negative, ANA negative from 5/13. Plan: - continue oxygen to keep SpO2 > 92% >> will likely need home oxygen - f/u ANCA, SCL-70 - started solumedrol trial 5/13 >> likely transition to prednisone 5/15 - PFT's ordered  Esophageal dysmotility noted on Esophagram. Plan: - may need GI evaluation - add protonix  8 mm RLL pulmonary nodule with borderline adenopathy with hx of smoking. Plan: - will need radiographic follow up as outpt  NSTEMI with acute pulmonary edema and acute systolic CHF. Plan: - per cardiology - continue negative fluid balance as tolerated  Coralyn Helling, MD [facility] Pulmonary/Critical Care 01/01/2013, 11:03 AM Pager:  [phone] After 3pm call: [phone]

## 2013-01-01 NOTE — Progress Notes (Signed)
Advanced Heart Failure Rounding Note   Subjective:   79 year old white male transferred from [facility] on 12/20/12 for evaluation of a non-ST elevation myocardial infarction and new onset congestive heart failure.  LHC 12/20/12 1. Severe two-vessel obstructive coronary artery disease. The right coronary is occluded with excellent collateral flow. There is a high-grade stenosis in the LAD immediately following the first diagonal.  2. Elevated left ventricular filling pressures  Lasix drip and Milrinone stopped on 12/24/12.    12/25/12 Successful PTCA/DES x 1 mid LAD. Echo EF 25-30%   CXR with persistent diffuse pulmonary infiltrates despite aggressive diuresis.   Esophagram negative for stricture or mass.  Chest CT suspicious for UIP. Started on steroids yesterday and says breathing has improved.  Still requiring 6L of O2 with ambulation.  No orthopnea, PND or edema.    Objective:     Vital Signs:   Temp:  [97.9 F (36.6 C)-98 F (36.7 C)] 97.9 F (36.6 C) (05/13 2121) Pulse Rate:  [58-66] 66 (05/14 0938) Resp:  [18] 18 (05/13 2121) BP: (119-138)/(62-63) 119/63 mmHg (05/14 0938) SpO2:  [93 %-96 %] 93 % (05/13 2121) Last BM Date: 12/31/12  Weight change: Filed Weights   12/29/12 0430 12/30/12 0458 12/31/12 0458  Weight: 174 lb 13.2 oz (79.3 kg) 178 lb 1.6 oz (80.786 kg) 175 lb 14.4 oz (79.788 kg)    Intake/Output:   Intake/Output Summary (Last 24 hours) at 01/01/13 1054 Last data filed at 01/01/13 0900  Gross per 24 hour  Intake    600 ml  Output    300 ml  Net    300 ml     Physical Exam:  General:  Elderly frail appearing. No resp difficulty. Sitting in chair.  HEENT: normal Neck: supple. JVP 5-6. Carotids 2+ bilat; no bruits. No lymphadenopathy or thryomegaly appreciated. Cor: PMI nondisplaced. Regular rate & rhythm. No rubs, gallops or murmurs. Lungs: Dry crackles throughout.  Abdomen: soft, nontender, no distention. No hepatosplenomegaly. No bruits or  masses. Good bowel sounds.  Extremities: no cyanosis, clubbing, rash, edema. . Neuro: alert & orientedx3, cranial nerves grossly intact. moves all 4 extremities w/o difficulty. Affect pleasant  Telemetry:  SR 76 pbm  Labs: Basic Metabolic Panel:  Recent Labs Lab 12/28/12 0500 12/29/12 0500 12/30/12 0500 12/31/12 1120 01/01/13 0500  NA 143 143 143 141 144  K 3.6 3.4* 4.1 3.9 3.9  CL 108 108 111 107 108  CO2 24 23 24 26 26   GLUCOSE 100* 85 99 141* 145*  BUN 24* 22 20 16 17   CREATININE 0.91 0.91 0.81 0.96 0.74  CALCIUM 8.3* 8.6 8.5 8.8 8.6    Liver Function Tests:  Recent Labs Lab 12/30/12 0500  AST 29  ALT 26  ALKPHOS 46  BILITOT 0.5  PROT 6.5  ALBUMIN 2.1*   No results found for this basename: LIPASE, AMYLASE,  in the last 168 hours No results found for this basename: AMMONIA,  in the last 168 hours  CBC:  Recent Labs Lab 12/28/12 0500 12/29/12 0500 12/30/12 0500 12/31/12 0544 01/01/13 0500  WBC 12.5* 10.5 10.0 8.7 5.2  HGB 11.4* 11.0* 11.0* 10.9* 11.8*  HCT 34.6* 33.9* 34.7* 34.0* 36.5*  MCV 86.7 87.6 87.8 88.1 87.5  PLT 349 374 389 413* 402*    Cardiac Enzymes: No results found for this basename: CKTOTAL, CKMB, CKMBINDEX, TROPONINI,  in the last 168 hours  BNP: BNP (last 3 results)  Recent Labs  12/23/12 0500 12/30/12 0500 12/31/12 0435  PROBNP  5717.0* 1661.0* 1836.0*     Other results:   Imaging: Dg Chest 2 View  12/30/2012   *RADIOLOGY REPORT*  Clinical Data: Follow up pulmonary infiltrates  CHEST - 2 VIEW  Comparison: 12/28/2012  Findings: Cardiac shadow is stable.  Bilateral parenchymal infiltrates are seen throughout both lungs and relatively stable given some variation and the technical aspects of the film.  No sizable effusion is noted.  No other focal abnormality is seen.  IMPRESSION: No change in bilateral infiltrates.   Original Report Authenticated By: Alcide Clever, M.D.   Ct Chest Wo Contrast  12/31/2012   *RADIOLOGY REPORT*   Clinical Data: Evaluate pulmonary infiltrates.  CT CHEST WITHOUT CONTRAST  Technique:  Multidetector CT imaging of the chest was performed following the standard protocol without IV contrast.  Comparison: 12/30/2012  Findings: Very small right pleural effusion is noted.  Diffuse peripheral interstitial reticulation is identified.  There are areas of traction bronchiectasis noted bilaterally.  Lower lobe predominant peripheral honeycombing is identified.  This is most notable at the left lung base.  Patchy areas of ground-glass attenuation are noted bilaterally.  There is a pulmonary nodule within the superior segment of right lower lobe which measures 8 mm, image number 36/series 3.  Mild cardiac enlargement.  No pericardial effusion.  Multiple, mediastinal and hilar lymph nodes are noted.  Index prevascular lymph node measures 1 cm,, image 28/series 2.  9 mm subcarinal lymph node is identified, image 32/series 2.  There is a 2.5 cm low attenuation nodule within the right lobe of thyroid gland.  Limited imaging through the upper abdomen shows a low attenuation structure within the central liver measuring 6 mm, image number five/series 2.  This is too small to characterize.  Pneumobilia is identified consistent with biliary patency.  The adrenal glands both appear normal.  Review of the visualized osseous structures is significant for mild multilevel spondylosis.  No aggressive lytic or sclerotic bone lesions identified.  IMPRESSION:  1.  Spectrum of the pulmonary findings are concerning for usual interstitial pneumonitis (UIP).  Consider further evaluation with high-resolution CT the chest and pulmonary function tests. 2.  Prominent mediastinal lymph nodes are nonspecific in the setting of interstitial lung disease. 3.  Pulmonary nodule in the right lower lobe measures 8 mm. If the patient is at high risk for bronchogenic carcinoma, follow-up chest CT at 3-6 months is recommended.  If the patient is at low risk for  bronchogenic carcinoma, follow-up chest CT at 6-12 months is recommended.  This recommendation follows the consensus statement: Guidelines for Management of Small Pulmonary Nodules Detected on CT Scans: A Statement from the Fleischner Society as published in Radiology 2005; 237:395-400.   Original Report Authenticated By: Signa Kell, M.D.   Dg Esophagus  01/01/2013   *RADIOLOGY REPORT*  Clinical Data:Reflux  ESOPHAGUS/BARIUM SWALLOW/TABLET STUDY  Fluoroscopy Time: 3 minutes and 3 seconds  Comparison: None.  Findings: Single contrast imaging of the esophagus shows no esophageal stricture.  No esophageal diverticulum.  No constricting mass lesion.  There is disruption of primary peristalsis consistent with nonspecific esophageal motility disorder.  13 mm barium tablet passes readily into the stomach when taken with water.  IMPRESSION: No evidence for esophageal mass lesion or stricture.   Original Report Authenticated By: Kennith Center, M.D.     Medications:     Scheduled Medications: . alteplase  2 mg Intracatheter Once  . aspirin EC  81 mg Oral Daily  . atorvastatin  80 mg Oral q1800  .  carvedilol  6.25 mg Oral BID WC  . clopidogrel  75 mg Oral Q breakfast  . enalapril  5 mg Oral BID  . enoxaparin (LOVENOX) injection  40 mg Subcutaneous Q24H  . furosemide  40 mg Oral Daily  . methylPREDNISolone (SOLU-MEDROL) injection  60 mg Intravenous Q6H  . sodium chloride  10-40 mL Intracatheter Q12H  . sodium chloride  3 mL Intravenous Q12H  . spironolactone  25 mg Oral Daily    Infusions: . sodium chloride 10 mL/hr at 12/22/12 0700    PRN Medications: sodium chloride, acetaminophen, albuterol, ondansetron (ZOFRAN) IV, sodium chloride, sodium chloride   Assessment:   1. NSTEMI - 2 vessel CAD 2. ICM- EF 25%  3. Chronic Renal Failure 4. UTI 5. S/P Successful PTCA/DES x 1 mid LAD 6. Acute on chronic respiratory failure - possibly UIP by CT (ESR > 100) 7. Severe deconditioning 8. Acute  systolic heart failure  Plan/Discussion:    Volume status stable on po lasix.  He was started on steroids yesterday by pulmonary but continues to require 6L of O2 with ambulation.  ? If we need to change beta blocker to more selective agent with lung disease.  Will discuss further outpatient management with pulmonary.  Will need both HF and pulmonary f/u.    Continue Plavix for recent LAD pci. Hopefully EF will recover post PCI  Dispo: home with HH in the next 24 hours.  Robbi Garter, [facility] 10:54 AM   Patient seen and examined with Ulyess Blossom, PA-C. We discussed all aspects of the encounter. I agree with the assessment and plan as stated above.   Stable from a cardiac standpoint. Now on steroids for possible UIP. Can be discharged from a cardiac standpoint but will discuss with pulmonary regarding their timeline. Hopefully we can get him home with [facility] tomorrow am.   Can meds for CAD and HF.   Othman Masur,MD 3:16 PM

## 2013-01-01 NOTE — Progress Notes (Signed)
CARDIAC REHAB PHASE I   PRE:  Rate/Rhythm: 62 SR  BP:  Supine:   Sitting: 119/63  Standing:    SaO2: 96 2L  MODE:  Ambulation: 200 ft   POST:  Rate/Rhythm: 69  BP:  Supine:   Sitting: 106/60  Standing:    SaO2: 87 4L 91 6L 0940-1005 On arrival pt in recliner O2 sat on 2L 96%. Assisted X 1 used walker and O2 4L to ambulate. Gait steady with walker. Pt is DOE O2 sat on 4L during walk 86-87%, increased O2 to 6L sat improved to 90%. Walked him rest of walk on 6L sat on return from 200  foot walk 91%. Pt back to recliner after walk with call light in reach.  Melina Copa RN 01/01/2013 10:03 AM

## 2013-01-01 NOTE — Progress Notes (Signed)
Physical Therapy Treatment Patient Details Name: Christopher [name] MRN: [id number] DOB: 06/15/1928 Today's Date: 01/01/2013 Time: 9528-4132 PT Time Calculation (min): 26 min  PT Assessment / Plan / Recommendation Comments on Treatment Session   Pt cont's to present with DOE & decreased 02 sats with activity.      Follow Up Recommendations  Home health PT;Supervision - Intermittent     Does the patient have the potential to tolerate intense rehabilitation     Barriers to Discharge        Equipment Recommendations       Recommendations for Other Services    Frequency Min 3X/week   Plan Discharge plan remains appropriate    Precautions / Restrictions Precautions Precautions: Fall Precaution Comments: O2 drops even on O2 Restrictions Weight Bearing Restrictions: No   Pertinent Vitals/Pain 02 sats:   96% 2.5L 02at rest upon arrival                 desats to 85% 3L while ambulating; quickly recovered back to >90% with standing rest break & pursed lip breathing.                  95% 2.5L 02 at end of session.     Mobility  Bed Mobility Bed Mobility: Not assessed Transfers Transfers: Sit to Stand;Stand to Sit Sit to Stand: 6: Modified independent (Device/Increase time);With upper extremity assist;With armrests;From chair/3-in-1 Stand to Sit: 6: Modified independent (Device/Increase time);With upper extremity assist;With armrests;To chair/3-in-1 Details for Transfer Assistance: Performed 6x's for strengthening & activity tolerance.   Ambulation/Gait Ambulation/Gait Assistance: 4: Min guard Ambulation Distance (Feet): 180 Feet Assistive device: Rolling walker Ambulation/Gait Assistance Details: cues for body positioning inside RW, tall posture, & pursed lip breathing.   Gait Pattern: Step-through pattern;Decreased stride length (decresaed step height) General Gait Details: 02 sats dropping to 85% on 3L 02 but quickly recovered >90% with standing rest break & pursed lip breathing.    Stairs: No Wheelchair Mobility Wheelchair Mobility: No    Exercises General Exercises - Lower Extremity Ankle Circles/Pumps: AROM;Both;15 reps Long Arc Quad: Strengthening;Both;15 reps Hip Flexion/Marching: Strengthening;Both;15 reps Toe Raises: Both;15 reps Heel Raises: Both;15 reps     PT Goals Acute Rehab PT Goals Time For Goal Achievement: 01/03/13 Potential to Achieve Goals: Good Pt will go Supine/Side to Sit: with modified independence Pt will go Sit to Stand: with modified independence PT Goal: Sit to Stand - Progress: Met Pt will go Stand to Sit: with modified independence PT Goal: Stand to Sit - Progress: Met Pt will Ambulate: 51 - 150 feet;with modified independence;with rolling walker PT Goal: Ambulate - Progress: Progressing toward goal Pt will Go Up / Down Stairs: 3-5 stairs;with min assist;with least restrictive assistive device Pt will Perform Home Exercise Program: Independently PT Goal: Perform Home Exercise Program - Progress: Progressing toward goal  Visit Information  Last PT Received On: 01/01/13 Assistance Needed: +1    Subjective Data      Cognition  Cognition Arousal/Alertness: Awake/alert Behavior During Therapy: WFL for tasks assessed/performed Overall Cognitive Status: Within Functional Limits for tasks assessed    Balance     End of Session PT - End of Session Equipment Utilized During Treatment: Gait belt;Oxygen Activity Tolerance: Patient tolerated treatment well Patient left: in chair;with call bell/phone within reach Nurse Communication: Mobility status    Christopher [name], [place] [phone] 01/01/2013

## 2013-01-01 NOTE — Progress Notes (Signed)
PFT completed. Unconfirmed results to be placed in Shadow Chart 

## 2013-01-02 DIAGNOSIS — J189 Pneumonia, unspecified organism: Secondary | ICD-10-CM

## 2013-01-02 LAB — BASIC METABOLIC PANEL
BUN: 23 mg/dL (ref 6–23)
GFR calc non Af Amer: 83 mL/min — ABNORMAL LOW (ref >90)
Glucose, Bld: 146 mg/dL — ABNORMAL HIGH (ref 70–99)
Potassium: 4 mEq/L (ref 3.5–5.1)

## 2013-01-02 LAB — ANCA SCREEN W REFLEX TITER
Atypical p-ANCA Screen: NEGATIVE
p-ANCA Screen: NEGATIVE

## 2013-01-02 LAB — CBC
HCT: 32.9 % — ABNORMAL LOW (ref 39.0–52.0)
Hemoglobin: 10.7 g/dL — ABNORMAL LOW (ref 13.0–17.0)
MCHC: 32.5 g/dL (ref 30.0–36.0)
MCV: 87.7 fL (ref 78.0–100.0)

## 2013-01-02 MED ORDER — CLOPIDOGREL BISULFATE 75 MG PO TABS: 75.0000 mg | ORAL_TABLET | Freq: Every day | ORAL | Status: DC

## 2013-01-02 MED ORDER — PREDNISONE 20 MG PO TABS: 60.0000 mg | ORAL_TABLET | Freq: Every day | ORAL | Status: DC

## 2013-01-02 MED ORDER — ATORVASTATIN CALCIUM 80 MG PO TABS: 80.0000 mg | ORAL_TABLET | Freq: Every day | ORAL | Status: DC

## 2013-01-02 MED ORDER — CARVEDILOL 6.25 MG PO TABS: 6.2500 mg | ORAL_TABLET | Freq: Two times a day (BID) | ORAL | Status: DC

## 2013-01-02 MED ORDER — FUROSEMIDE 40 MG PO TABS: 40.0000 mg | ORAL_TABLET | Freq: Every day | ORAL | Status: DC

## 2013-01-02 MED ORDER — ENALAPRIL MALEATE 5 MG PO TABS: 5.0000 mg | ORAL_TABLET | Freq: Two times a day (BID) | ORAL | Status: DC

## 2013-01-02 MED ORDER — PREDNISONE 50 MG PO TABS
60.0000 mg | ORAL_TABLET | Freq: Every day | ORAL | Status: DC
  Administered 2013-01-02: 60 mg via ORAL
  Filled 2013-01-02 (×2): qty 1

## 2013-01-02 MED ORDER — SPIRONOLACTONE 25 MG PO TABS: 25.0000 mg | ORAL_TABLET | Freq: Every day | ORAL | Status: DC

## 2013-01-02 MED ORDER — ASPIRIN 81 MG PO TBEC: 81.0000 mg | DELAYED_RELEASE_TABLET | Freq: Every day | ORAL | Status: DC

## 2013-01-02 NOTE — Progress Notes (Signed)
CARDIAC REHAB PHASE I   PRE:  Rate/Rhythm: 54 SB  BP:  Supine:   Sitting: 106/40  Standing:    SaO2: 96 2L 91 RA  MODE:  Ambulation: 300 ft   POST:  Rate/Rhythm: 87  BP:  Supine:   Sitting: 108/56  Standing:   SaO2: 84 4L 91 6L 1003-1115  On arrival pt on O2 2L, discontinued O2 room air sat with pt sitting in recliner 91%. Pt stood to put on back gown room air sat 87% and pt DOE. Applied O2 4L sat improved to 94%. Assisted X 1 and used walker to ambulate. Gait stweady with walker, pt DOE. O2 sat after short distance dropped to 84% ion 4L. I increased O2 to 6L sat improved to 91%. Walked pt rest of walk on 6L sat on return 91%. Pt back to recliner after walk with call light in reach. Completed MI and CHF education with pt. He voices understanding.  Pt agrees to Outpt. CRP in [place], will send referral.  Melina Copa RN 01/02/2013 11:06 AM

## 2013-01-02 NOTE — Progress Notes (Signed)
SATURATION QUALIFICATIONS: (This note is used to comply with regulatory documentation for home oxygen)  Patient Saturations on Room Air at Rest = 92%  Patient Saturations on Room Air while Ambulating = 87%  Patient Saturations on 6 Liters of oxygen while Ambulating = 91%   Please briefly explain why patient needs home oxygen: Pt dests on O2 4L to 84% during walk, required 6L to keep sat 91%.

## 2013-01-02 NOTE — Progress Notes (Signed)
Pt had a run of 10 beats of VT, VSS, no c/o pain, no distress noticed. MD notified order for lab draw in am and continue to monitor gotten. We'll continue with POC.

## 2013-01-02 NOTE — Discharge Summary (Addendum)
Advanced Heart Failure Team  Discharge Summary   Patient ID: Christopher [name] MRN: [id number], DOB/AGE: [birth year] 77 y.o. Admit date: 12/19/2012 D/C date:     01/02/2013   Primary Discharge Diagnoses:  1. NSTEMI - 2 vessel CAD 2. CAD; s/p PTCA/DES x1 mid LAD 3. Acute systolic heart failure 4. Acute on chronic respiratory failure      --Probable UIP on chest CT 5. ICM- EF 25%  6. Scleroderma   Secondary Discharge Diagnoses:  1. Chronic renal failure 2. UTI 3. Severe deconditioning 4. H/o tobacco abuse, quit 20 years ago  Hospital Course:  Christopher [name] is a 79 yo male without prior cardiac history that presented to [facility] on 12/20/12 with complaints of chest pressure and dyspnea that occurred with exertion and rest.  On arrival to [facility] EKG showed irregular rhythm with frequent PACs and PVCs.  Troponin was elevated to 5.3 and CKMB 49.5.  CXR suggested pulmonary edema.  He was given IV lasix 40 mg x1 and placed on nonrebreather mask.  Lovenox 80 mg was given SQ and IV nitroglycerin was started with improvement in his chest pain.  He was transferred to [facility] for further work up of NSTEMI and new onset congestive heart failure.    He was taken to the cardiac cath lab by Dr. [place]azil[place] on 5/2 for evaluation of chest pain.  LHC showed severe two vessel CAD with occluded RCA with left-to-right collaterals, high-grade stenosis in the LAD.  PCI was deferred due to high EDP.  Aggressive medical management was started with heparin, plavix, nitro and diuresis until respiratory status improved.        Follow up echo post cath showed EF 25%.  Diuresis with IV lasix 100 mg BID was continued and Cr bumped to 1.5 and milrinone was added on 5/4.  Lasix was also changed to continuous infusion with good response.  He tolerated addition of carvedilol and enalapril.  Milrinone and lasix drip were stopped on 5/6 in preparation for PCI on 5/7.  Dr. Clifton James performed successful PCI of the LAD with Promus Premier DES.   He will need at least one year of ASA and plavix.  He tolerated the procedure well.    On 5/8 he began to work with cardiac rehab and it was noted that O2 sats were in the 80s at rest and worse with exertion.  He was restarted on po lasix om 5/12.  A pulmonary consult was obtained as he required 6L of O2 with ambulation.  Chest CXR showed persistent pulmonary infiltrates despite diuresis therefore CT of chest was ordered.  This showed findings consistent with UIP pattern that suggest pulmonary fibrosis with acute pneumonitis therefore oral steroids were given.  He did state some improvement with his symptoms.  During admission scleroderma antibody was found to be positive.  He will have outpatient follow up with Dr. Marchelle Gearing.  Of noted, he was diagnosed with a UTI and completed course of cipro.  He will be discharged in stable condition with close follow up in the HF clinic.  [facility] will have [facility] for [facility] and oxygen management.    Discharge Weight Range: 174-176 pounds Discharge Vitals: Blood pressure 117/52, pulse 51, temperature 97.4 F (36.3 C), temperature source Oral, resp. rate 18, height 5\' 9"  (1.753 m), weight 79.198 kg (174 lb 9.6 oz), SpO2 98.00%.  LHC 12/20/12 Coronary angiography:  Coronary dominance: right  Left mainstem: The left main coronary is short with mild disease less than 30%.  Left anterior descending (LAD): The left anterior descending artery is a large vessel extending to the apex. Is moderately calcified in the proximal vessel. After the takeoff of the first diagonal there is a focal 90% stenosis. In the mid LAD there is a segment of 50% disease. The first diagonal has mild disease less than 30-40%.  Left circumflex (LCx): The left circumflex is a relatively small vessel with mild nonobstructive disease less than 20%.  Right coronary artery (RCA): The right coronary is a dominant vessel. It has segmental disease in the proximal vessel up to 80%. It is  occluded in the mid vessel with excellent left-to-right collaterals to the entire right coronary.  Left ventriculography: Not performed due to elevated creatinine and high EDP.  Physical Exam:  General: Elderly frail appearing. No resp difficulty. Sitting in chair.  HEENT: normal  Neck: supple. JVP 5-6. Carotids 2+ bilat; no bruits. No lymphadenopathy or thryomegaly appreciated.  Cor: PMI nondisplaced. Regular rate & rhythm. No rubs, gallops or murmurs.  Lungs: Dry crackles throughout.  Abdomen: soft, nontender, no distention. No hepatosplenomegaly. No bruits or masses. Good bowel sounds.  Extremities: no cyanosis, clubbing, rash, edema. .  Neuro: alert & orientedx3, cranial nerves grossly intact. moves all 4 extremities w/o difficulty. Affect pleasant   Labs: Lab Results  Component Value Date   WBC 9.6 01/02/2013   HGB 10.7* 01/02/2013   HCT 32.9* 01/02/2013   MCV 87.7 01/02/2013   PLT 413* 01/02/2013    Recent Labs Lab 12/30/12 0500  01/02/13 0634  NA 143  < > 140  K 4.1  < > 4.0  CL 111  < > 106  CO2 24  < > 23  BUN 20  < > 23  CREATININE 0.81  < > 0.76  CALCIUM 8.5  < > 8.5  PROT 6.5  --   --   BILITOT 0.5  --   --   ALKPHOS 46  --   --   ALT 26  --   --   AST 29  --   --   GLUCOSE 99  < > 146*  < > = values in this interval not displayed. Lab Results  Component Value Date   CHOL 133 12/20/2012   HDL 24* 12/20/2012   LDLCALC 88 12/20/2012   TRIG 106 12/20/2012   BNP (last 3 results)  Recent Labs  12/23/12 0500 12/30/12 0500 12/31/12 0435  PROBNP 5717.0* 1661.0* 1836.0*    Diagnostic Studies/Procedures   Ct Chest Wo Contrast  12/31/2012   *RADIOLOGY REPORT*  Clinical Data: Evaluate pulmonary infiltrates.  CT CHEST WITHOUT CONTRAST  Technique:  Multidetector CT imaging of the chest was performed following the standard protocol without IV contrast.  Comparison: 12/30/2012  Findings: Very small right pleural effusion is noted.  Diffuse peripheral interstitial  reticulation is identified.  There are areas of traction bronchiectasis noted bilaterally.  Lower lobe predominant peripheral honeycombing is identified.  This is most notable at the left lung base.  Patchy areas of ground-glass attenuation are noted bilaterally.  There is a pulmonary nodule within the superior segment of right lower lobe which measures 8 mm, image number 36/series 3.  Mild cardiac enlargement.  No pericardial effusion.  Multiple, mediastinal and hilar lymph nodes are noted.  Index prevascular lymph node measures 1 cm,, image 28/series 2.  9 mm subcarinal lymph node is identified, image 32/series 2.  There is a 2.5 cm low attenuation nodule within the right lobe of thyroid gland.  Limited imaging through the upper abdomen shows a low attenuation structure within the central liver measuring 6 mm, image number five/series 2.  This is too small to characterize.  Pneumobilia is identified consistent with biliary patency.  The adrenal glands both appear normal.  Review of the visualized osseous structures is significant for mild multilevel spondylosis.  No aggressive lytic or sclerotic bone lesions identified.  IMPRESSION:  1.  Spectrum of the pulmonary findings are concerning for usual interstitial pneumonitis (UIP).  Consider further evaluation with high-resolution CT the chest and pulmonary function tests. 2.  Prominent mediastinal lymph nodes are nonspecific in the setting of interstitial lung disease. 3.  Pulmonary nodule in the right lower lobe measures 8 mm. If the patient is at high risk for bronchogenic carcinoma, follow-up chest CT at 3-6 months is recommended.  If the patient is at low risk for bronchogenic carcinoma, follow-up chest CT at 6-12 months is recommended.  This recommendation follows the consensus statement: Guidelines for Management of Small Pulmonary Nodules Detected on CT Scans: A Statement from the Fleischner Society as published in Radiology 2005; 237:395-400.   Original  Report Authenticated By: Signa Kell, M.D.    Discharge Medications     Medication List    STOP taking these medications       amLODipine 10 MG tablet  Commonly known as:  NORVASC     sulfamethoxazole-trimethoprim 800-160 MG per tablet  Commonly known as:  BACTRIM DS      TAKE these medications       aspirin 81 MG EC tablet  Take 1 tablet (81 mg total) by mouth daily.     atorvastatin 80 MG tablet  Commonly known as:  LIPITOR  Take 1 tablet (80 mg total) by mouth daily at 6 PM.     carvedilol 6.25 MG tablet  Commonly known as:  COREG  Take 1 tablet (6.25 mg total) by mouth 2 (two) times daily with a meal.     clopidogrel 75 MG tablet  Commonly known as:  PLAVIX  Take 1 tablet (75 mg total) by mouth daily with breakfast.     enalapril 5 MG tablet  Commonly known as:  VASOTEC  Take 1 tablet (5 mg total) by mouth 2 (two) times daily.     furosemide 40 MG tablet  Commonly known as:  LASIX  Take 1 tablet (40 mg total) by mouth daily.     polyethylene glycol packet  Commonly known as:  MIRALAX / GLYCOLAX  Take 17 g by mouth daily.     predniSONE 20 MG tablet  Commonly known as:  DELTASONE  Take 3 tablets (60 mg total) by mouth daily with breakfast.     PROCTOZONE-HC 2.5 % rectal cream  Generic drug:  hydrocortisone  Place 1 application rectally 2 (two) times daily.     spironolactone 25 MG tablet  Commonly known as:  ALDACTONE  Take 1 tablet (25 mg total) by mouth daily.        Disposition   The patient will be discharged in stable condition to home. Discharge Orders   Future Appointments Provider Department Dept Phone   01/09/2013 2:30 PM [facility] [phone]   01/15/2013 1:30 PM Kalman Shan, MD [facility] [phone]   Future Orders Complete By Expires     ACE Inhibitor / ARB already ordered  As directed     Amb Referral to Cardiac Rehabilitation  As directed  Beta  Blocker already ordered  As directed     Diet - low sodium heart healthy  As directed     Heart Failure patients record your daily weight using the same scale at the same time of day  As directed     Increase activity slowly  As directed       Follow-up Information   Follow up with [facility]. (Home health Nurse, Physical Therapy, and Occupational Therapy)    Contact information:   [phone]      Follow up with [facility], MD On 01/15/2013. (Appt at 1:30)    Contact information:   [address] [place] [address] [phone]       Follow up with Arvilla Meres, MD On 01/09/2013. (@ 2:30 pm; Garage code [id number])    Contact information:   [address] [place] [address] [phone]         Duration of Discharge Encounter: Greater than 35 minutes   Migdalia Dk, [facility] 01/02/2013, 1:16 PM  Patient seen and examined with Ulyess Blossom, PA-C. We discussed all aspects of the encounter. I agree with the assessment and plan as stated above. Improving on steroids.  Appreciate Dr. Evlyn Courier excellent care.SCL-70 markedly elevated but no obvious cutaneous findings. To f/u with Dr. Marchelle Gearing. Home O2 has been arranged.   Will need Plavix x 1 year.  Deshauna Cayson,MD 1:16 PM

## 2013-01-02 NOTE — Progress Notes (Signed)
Patient's Central LIne has been discontinued via IV nurse, telemetry has been removed; patient and family verbalizes understanding of discharge instruction. Patient will be discharged home with family.   Lorretta Harp

## 2013-01-02 NOTE — Progress Notes (Signed)
Referral received for SNF. Chart reviewed and CSW has spoken with RNCM who indicates that patient is for DC to home with Home Health and DME.  CSW to sign off. Lorri Frederick. [place]  [phone]

## 2013-01-02 NOTE — Progress Notes (Signed)
PULMONARY  / CRITICAL CARE MEDICINE  Name: Christopher [name] MRN: [id number] DOB: 04/13/1928    ADMISSION DATE:  12/19/2012 CONSULTATION DATE:  12/29/2012  REFERRING MD :  [place] Bing  CHIEF COMPLAINT:  Dyspnea  BRIEF PATIENT DESCRIPTION:  79 y/o male with remote hx of smoking with progressive exertional dyspnea, and chest pain and found to have NSTEMI with acute systolic heart failure and acute pulmonary edema.  Transferred from [facility] for further cardiac assessment.  Started on tx for UTI as outpt 12/16/12.  Had persistent dyspnea/hypoxia, and pulmonary infiltrates on CXR in spite of diuresis.  PCCM consulted to further assess.  Has hx of reflux with possible aspiration.  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    SIGNIFICANT EVENTS: 5/01 Transfer to [facility] from [facility] 5/06 Off lasix gtt, and milrinone 5/07 PTCA/DES x 1 to mid LAD 5/13 ESR 103, start trial of solumedrol  STUDIES:  5/02 Echo >> septal/apical/inferior wall hypokinesis, EF 25 to 30%, mild MR, mild LA/RA dilation 5/02 Lt heart cath >> severe two vessel CAD, elevated LV filling pressures 5/12 Esophagram >> disruption of primary peristalsis consistent with nonspecific esophageal motility disorder 5/12 Speech therapy swallow evaluation >> normal swallow function 5/13 Labs >> ESR 103, RF negative, ANA negative 5/13 CT chest >> diffuse peripheral interstitial reticulation, lower lobe predominant honeycombing, patchy areas of GGO b/l, 8 mm RLL nodule, borderline LAN up to 1 cm, 2.5 cm Rt thyroid nodule 5/14 PFT >> FEV1 1.76 (78%), FEV1% 84, TLC 6.26 (57%), DLCO 39%, no BD response  LINES / TUBES: Lt PICC 5/04 >>   CULTURES: 5/06 Urine >> negative  ANTIBIOTICS: 5/01 Cipro >>5/12  SUBJECTIVE:  Feelings breathing.  Denies cough, sputum, chest pain.  VITAL SIGNS: Temp:  [97.1 F (36.2 C)-98.3  F (36.8 C)] 98.3 F (36.8 C) (05/15 0642) Pulse Rate:  [50-62] 50 (05/15 0642) Resp:  [16-18] 16 (05/15 0642) BP: (124-130)/(60-69) 130/61 mmHg (05/15 0642) SpO2:  [96 %-98 %] 98 % (05/15 0642) Weight:  [174 lb 9.6 oz (79.198 kg)] 174 lb 9.6 oz (79.198 kg) (05/15 0642) 3 liters [place]  PHYSICAL EXAMINATION: General:  No distress Neuro:  Alert, follows commands HEENT:  No sinus tenderness Cardiovascular:  s1s2 regular Lungs:  B/l crackles, no wheeze Abdomen:  Soft, non tender Musculoskeletal:  No edema, no clubbing Skin:  No rashes   Recent Labs Lab 12/31/12 1120 01/01/13 0500 01/02/13 0634  NA 141 144 140  K 3.9 3.9 4.0  CL 107 108 106  CO2 26 26 23   BUN 16 17 23   CREATININE 0.96 0.74 0.76  GLUCOSE 141* 145* 146*    Recent Labs Lab 12/31/12 0544 01/01/13 0500 01/02/13 0634  HGB 10.9* 11.8* 10.7*  HCT 34.0* 36.5* 32.9*  WBC 8.7 5.2 9.6  PLT 413* 402* 413*   Ct Chest Wo Contrast  12/31/2012   *RADIOLOGY REPORT*  Clinical Data: Evaluate pulmonary infiltrates.  CT CHEST WITHOUT CONTRAST  Technique:  Multidetector CT imaging of the chest was performed following the standard protocol without IV contrast.  Comparison: 12/30/2012  Findings: Very small right pleural effusion is noted.  Diffuse peripheral interstitial reticulation is identified.  There are areas of traction bronchiectasis noted bilaterally.  Lower lobe predominant peripheral honeycombing is identified.  This is most notable at the left lung base.  Patchy areas of ground-glass attenuation are noted bilaterally.  There  is a pulmonary nodule within the superior segment of right lower lobe which measures 8 mm, image number 36/series 3.  Mild cardiac enlargement.  No pericardial effusion.  Multiple, mediastinal and hilar lymph nodes are noted.  Index prevascular lymph node measures 1 cm,, image 28/series 2.  9 mm subcarinal lymph node is identified, image 32/series 2.  There is a 2.5 cm low attenuation nodule within the  right lobe of thyroid gland.  Limited imaging through the upper abdomen shows a low attenuation structure within the central liver measuring 6 mm, image number five/series 2.  This is too small to characterize.  Pneumobilia is identified consistent with biliary patency.  The adrenal glands both appear normal.  Review of the visualized osseous structures is significant for mild multilevel spondylosis.  No aggressive lytic or sclerotic bone lesions identified.  IMPRESSION:  1.  Spectrum of the pulmonary findings are concerning for usual interstitial pneumonitis (UIP).  Consider further evaluation with high-resolution CT the chest and pulmonary function tests. 2.  Prominent mediastinal lymph nodes are nonspecific in the setting of interstitial lung disease. 3.  Pulmonary nodule in the right lower lobe measures 8 mm. If the patient is at high risk for bronchogenic carcinoma, follow-up chest CT at 3-6 months is recommended.  If the patient is at low risk for bronchogenic carcinoma, follow-up chest CT at 6-12 months is recommended.  This recommendation follows the consensus statement: Guidelines for Management of Small Pulmonary Nodules Detected on CT Scans: A Statement from the Fleischner Society as published in Radiology 2005; 237:395-400.   Original Report Authenticated By: Signa Kell, M.D.    ASSESSMENT / PLAN:  Acute hypoxic respiratory failure with persistent pulmonary infiltrates >> CT chest findings consistent with UIP pattern with areas of GGO.  Most suggestive of pulmonary fibrosis with acute pneumonitis.  No old CXR's for comparison.  Restrictive and diffusion defect on PFT, but no obstruction.   SCL-70 >> 230 from 5/13!  Plan: - continue oxygen to keep SpO2 > 92% >> will likely need home oxygen - f/u ANCA - transition to prednisone 5/15 - further assess for additional immunomodulating agents as outpt  Esophageal dysmotility noted on Esophagram. Plan: - may need GI evaluation - continue  protonix  8 mm RLL pulmonary nodule with borderline adenopathy with hx of smoking. Plan: - will need radiographic follow up as outpt  NSTEMI with acute pulmonary edema and acute systolic CHF. Plan: - per cardiology - continue negative fluid balance as tolerated  Okay for d/c home.  Have schedule follow up with Dr. Marchelle Gearing in ILD clinic Wednesday, May 28, at 1:30 PM in [facility] office building.  Coralyn Helling, MD [facility] Pulmonary/Critical Care 01/02/2013, 10:47 AM Pager:  [phone] After 3pm call: [phone]

## 2013-01-09 ENCOUNTER — Ambulatory Visit (HOSPITAL_COMMUNITY)
Admit: 2013-01-09 | Discharge: 2013-01-09 | Disposition: A | Discharge status: Home / Self Care | Payer: Medicare Other | Source: Ambulatory Visit | Attending: Internal Medicine | Admitting: Internal Medicine

## 2013-01-09 VITALS — BP 108/60 | HR 65 | Wt 170.0 lb

## 2013-01-09 DIAGNOSIS — I5022 Chronic systolic (congestive) heart failure: Secondary | ICD-10-CM

## 2013-01-09 DIAGNOSIS — I499 Cardiac arrhythmia, unspecified: Secondary | ICD-10-CM | POA: Insufficient documentation

## 2013-01-09 DIAGNOSIS — M349 Systemic sclerosis, unspecified: Secondary | ICD-10-CM

## 2013-01-09 LAB — BASIC METABOLIC PANEL
CO2: 25 mEq/L (ref 19–32)
Chloride: 100 mEq/L (ref 96–112)
Creatinine, Ser: 0.67 mg/dL (ref 0.50–1.35)
GFR calc Af Amer: >90 mL/min (ref >90)
Potassium: 3.6 mEq/L (ref 3.5–5.1)

## 2013-01-09 MED ORDER — ENALAPRIL MALEATE 5 MG PO TABS: 5.0000 mg | ORAL_TABLET | Freq: Every day | ORAL | Status: DC

## 2013-01-09 NOTE — Progress Notes (Addendum)
Patient ID: Jazion Atteberry, male   DOB: 03/21/1928, 79 y.o.   MRN: [id number]  Weight Range   Baseline proBNP    HPI: Mr. Valbuena is a 79 yo male without prior cardiac history that presented to [facility] on 12/20/12 with complaints of chest pressure and dyspnea that occurred with exertion and rest. He was transferred to [facility] for further work up of NSTEMI and new onset congestive heart failure.   During his hospital stay ECHO showed EF 25%. He had a PCI of the LAD with Promus Premier DES on 12/25/12. He required 6 L O2 with walking in the hosptail and had a pulmonary consult. A CT of chest was ordered showing UIP pattern suggestive of pulmonary fibrosis with acute pneumonitis therefore oral steroids were given. He was also diagnosed with scleroderma (2014). He was diuresed and started on a BB, ACE-I and Cleda Daub.  Post hosptial visit with cousin present. Has been doing well at home and [facility] following. Taking all medications as prescribed. Weight at home 163-165 lbs. Denies SOB, orthopnea, CP or edema. Wearing 2L continuous O2. Ambulated in clinic and sats dropped only to 93%. Complaints of dizziness, not just with changing position.   ROS: All systems negative except as listed in HPI, PMH and Problem List.  Past Medical History  Diagnosis Date  . Hypertension   . BPH (benign prostatic hyperplasia)   . Hemorrhoids     Current Outpatient Prescriptions  Medication Sig Dispense Refill  . aspirin EC 81 MG EC tablet Take 1 tablet (81 mg total) by mouth daily.      Marland Kitchen atorvastatin (LIPITOR) 80 MG tablet Take 1 tablet (80 mg total) by mouth daily at 6 PM.  30 tablet  3  . carvedilol (COREG) 6.25 MG tablet Take 1 tablet (6.25 mg total) by mouth 2 (two) times daily with a meal.  60 tablet  3  . clopidogrel (PLAVIX) 75 MG tablet Take 1 tablet (75 mg total) by mouth daily with breakfast.  30 tablet  12  . enalapril (VASOTEC) 5 MG tablet Take 1 tablet (5 mg total) by mouth 2 (two) times daily.  60 tablet  3   . furosemide (LASIX) 40 MG tablet Take 1 tablet (40 mg total) by mouth daily.  30 tablet  3  . hydrocortisone (PROCTOZONE-HC) 2.5 % rectal cream Place 1 application rectally 2 (two) times daily.      . polyethylene glycol (MIRALAX / GLYCOLAX) packet Take 17 g by mouth daily.      . predniSONE (DELTASONE) 20 MG tablet Take 3 tablets (60 mg total) by mouth daily with breakfast.  21 tablet  0  . spironolactone (ALDACTONE) 25 MG tablet Take 1 tablet (25 mg total) by mouth daily.  30 tablet  3   No current facility-administered medications for this encounter.     PHYSICAL EXAM: Filed Vitals:   01/09/13 1402  BP: 108/60  Pulse: 65  Weight: 170 lb (77.111 kg)  SpO2: 98%    General: Elderly appearing. No resp difficulty HEENT: normal Neck: supple. JVP flat. Carotids 2+ bilaterally; no bruits. No lymphadenopathy or thryomegaly appreciated. Cor: PMI normal. Regular rate & rhythm. No rubs, gallops or murmurs. Lungs: clear Abdomen: soft, nontender, nondistended. No hepatosplenomegaly. No bruits or masses. Good bowel sounds. Extremities: no cyanosis, clubbing, rash, edema Neuro: alert & orientedx3, cranial nerves grossly intact. Moves all 4 extremities w/o difficulty. Affect pleasant.   EKG: SR with 1 AV block 62 bpm  ASSESSMENT & PLAN:

## 2013-01-09 NOTE — Assessment & Plan Note (Addendum)
NYHA II. Volume status good. Patient experiencing quite a bit of dizziness, will cut back enalapril to 5 mg QHS. K+ 3.6, will continue to follow; hopefully will increase with Cleda Daub. Follow up in 2-3 weeks. Reinforced daily weights. Follow up with pulmonary next week.

## 2013-01-09 NOTE — Patient Instructions (Addendum)
Stop taking your morning dose of enalapril (Vasotec) and just take 5 mg at night.  Doing great and keep up the good work.  Will follow up in 3 weeks.   Do the following things EVERYDAY: 1) Weigh yourself in the morning before breakfast. Write it down and keep it in a log. 2) Take your medicines as prescribed 3) Eat low salt foods-Limit salt (sodium) to 2000 mg per day.  4) Stay as active as you can everyday 5) Limit all fluids for the day to less than 2 liters

## 2013-01-10 NOTE — Addendum Note (Signed)
Encounter addended by: Merlene Pulling, CCT on: 01/10/2013  8:46 AM<BR>     Documentation filed: Charges VN

## 2013-01-15 ENCOUNTER — Telehealth: Payer: Self-pay | Admitting: Internal Medicine

## 2013-01-15 ENCOUNTER — Encounter: Payer: Self-pay | Admitting: Internal Medicine

## 2013-01-15 ENCOUNTER — Other Ambulatory Visit (INDEPENDENT_AMBULATORY_CARE_PROVIDER_SITE_OTHER): Payer: Medicare Other

## 2013-01-15 ENCOUNTER — Ambulatory Visit (INDEPENDENT_AMBULATORY_CARE_PROVIDER_SITE_OTHER): Payer: Medicare Other | Admitting: Internal Medicine

## 2013-01-15 VITALS — BP 110/62 | HR 65 | Temp 97.7°F | Ht 69.0 in | Wt 169.6 lb

## 2013-01-15 DIAGNOSIS — J849 Interstitial pulmonary disease, unspecified: Secondary | ICD-10-CM

## 2013-01-15 DIAGNOSIS — J8410 Pulmonary fibrosis, unspecified: Secondary | ICD-10-CM

## 2013-01-15 DIAGNOSIS — R911 Solitary pulmonary nodule: Secondary | ICD-10-CM

## 2013-01-15 LAB — CK: Total CK: 39 U/L (ref 7–232)

## 2013-01-15 LAB — SEDIMENTATION RATE: Sed Rate: 60 mm/hr — ABNORMAL HIGH (ref 0–22)

## 2013-01-15 NOTE — Patient Instructions (Addendum)
#  Right lower lobe 8mm nodule seen May 2014 - We'll get CT scan of the chest in August 2014; followup scan not ordered as of May 2014  #Interstitial lung disease Scarring in your lungs. This type of  Lung disease is called interstitial lung disease Please do blood test Please do breathing test Please go see a rheumatologist Meanwhile, continue your oxygen and home physical therapy  #Followup Return to see me with a month after finish all of the above  .Marland Kitchen Addendum: I need to figure out if he is taking prednisone currently or not

## 2013-01-15 NOTE — Progress Notes (Signed)
Subjective:    Patient ID: Christopher [name], male    DOB: [birth year], 79 y.o.   MRN: [id number] PCP Christopher Asa, MD  HPI Date of admission 12/19/2012 Date of discharge 01/02/2013 Primary Discharge Diagnoses:  1. NSTEMI - 2 vessel CAD  2. CAD; s/p PTCA/DES x1 mid LAD  3. Acute systolic heart failure  4. Acute on chronic respiratory failure  --Probable UIP on chest CT  5. ICM- EF 25%  6. Scleroderma  Secondary Discharge Diagnoses:  1. Chronic renal failure  2. UTI  3. Severe deconditioning  4. H/o tobacco abuse, quit 20 years ago  Hospital Course:  Christopher [name] is a 79 yo male without prior cardiac history that presented to [name] on 12/20/12 with complaints of chest pressure and dyspnea that occurred with exertion and rest. On arrival to [name] EKG showed irregular rhythm with frequent PACs and PVCs. Troponin was elevated to 5.3 and CKMB 49.5. CXR suggested pulmonary edema. He was given IV lasix 40 mg x1 and placed on nonrebreather mask. Lovenox 80 mg was given SQ and IV nitroglycerin was started with improvement in his chest pain. He was transferred to [facility] for further work up of NSTEMI and new onset congestive heart failure.  He was taken to the cardiac cath lab by Dr. [place] on 5/2 for evaluation of chest pain. LHC showed severe two vessel CAD with occluded RCA with left-to-right collaterals, high-grade stenosis in the LAD. PCI was deferred due to high EDP. Aggressive medical management was started with heparin, plavix, nitro and diuresis until respiratory status improved.  Follow up echo post cath showed EF 25%. Diuresis with IV lasix 100 mg BID was continued and Cr bumped to 1.5 and milrinone was added on 5/4. Lasix was also changed to continuous infusion with good response. He tolerated addition of carvedilol and enalapril. Milrinone and lasix drip were stopped on 5/6 in preparation for PCI on 5/7. Dr. Clifton James performed successful PCI of the LAD with Promus Premier DES. He will need  at least one year of [name] and plavix. He tolerated the procedure well.  On 5/8 he began to work with cardiac rehab and it was noted that O2 sats were in the 80s at rest and worse with exertion. He was restarted on po lasix om 5/12. A pulmonary consult was obtained as he required 6L of O2 with ambulation. Chest CXR showed persistent pulmonary infiltrates despite diuresis therefore CT of chest was ordered. This showed findings consistent with UIP pattern that suggest pulmonary fibrosis with acute pneumonitis therefore oral steroids were given. He did state some improvement with his symptoms. During admission scleroderma antibody was found to be positive. He will have outpatient follow up with Christopher [name].  Of noted, he was diagnosed with a UTI and completed course of cipro.  He will be discharged in stable condition with close follow up in the HF clinic. [facility] will have [facility] follow for Christopher [name] and oxygen management.    OV 01/15/2013 He was admitted for non-STEMI 12/19/2012 to 01/02/2013 along with new onset systolic heart failure with ejection fraction 25%. He status post stent. Despite aggressive cardiac management he persistent hypoxemia and chest x-ray with infiltrates. This then resulted in Autoimmune profile 12/31/2012 shows ESR highly elevated at 103 and highly elevated scleroderma SCL-70 antibody at 230 but negative ANA rheumatoid factor p-ANCA and atypical anca though Christopher [name] was positive. In addition CT chest 01/17/2013 shows 2 prominent findings. First one is lasting picture for UIP with bibasal  bilateral subpleural honeycombing in a craniocaudal pattern. Although groundglass infiltrates seem a little bit more prominent. In addition this 8 mm right lower lobe nodule   reports that he quit smoking about 54 years ago. His smoking use included Cigarettes. He has a 20 pack-year smoking history. He does not have any smokeless tobacco history on file.   In talking to him he reports insidious  onset of dyspnea for several months before admission for myocardial infarction along with mild cough but he categorically denies chronic dyspnea for year.   In terms of occupation he worked in the form and [employer]. Denies any found work, Building surveyor work, Therapist, sports work, asbestosis exposure or Engineer, civil (consulting) denies bird exposure. I'm not sure if he had amiodarone therapy but he denies any mold exposure.   He's not noted have collagen basilar disease and denies any Raynaud's phenomena or dry skin or dry eyes.  Currently he is much better after diuresis though not at baseline. He is also deconditioned but getting physical therapy at home which he feels is improving him a lot. He is on oxygen which is new for him   Other relevant studies done while in the hospital include  5/12 Esophagram >> disruption of primary peristalsis consistent with nonspecific esophageal motility disorder  5/12 Speech therapy swallow evaluation >> normal swallow function 5/14 PFT >> FEV1 1.76 (78%), FEV1% 84, TLC 6.26 (57%), DLCO 39%, no BD response   In terms of therapy  on 12/31/2012 patient was given Solu-Medrol and on 01/02/2013 at the time of discharge he was given oral prednisone? 60 mg daily to continue. This was on his medication list may 22nd 2014 with cardiology but at this point in time today 01/15/2013 I do not see this on his medication list and I do not know why   Review of Systems  Constitutional: Negative for fever and unexpected weight change.  HENT: Negative for ear pain, nosebleeds, congestion, sore throat, rhinorrhea, sneezing, trouble swallowing, dental problem, postnasal drip and sinus pressure.   Eyes: Negative for redness and itching.  Respiratory: Positive for cough and shortness of breath. Negative for chest tightness and wheezing.   Cardiovascular: Negative for palpitations and leg swelling.  Gastrointestinal: Negative for nausea and vomiting.  Genitourinary: Negative for dysuria.   Musculoskeletal: Negative for joint swelling.  Skin: Negative for rash.  Neurological: Negative for headaches.  Hematological: Does not bruise/bleed easily.  Psychiatric/Behavioral: Negative for dysphoric mood. The patient is not nervous/anxious.    Current outpatient prescriptions:aspirin EC 81 MG EC tablet, Take 1 tablet (81 mg total) by mouth daily., Disp: , Rfl: ;  atorvastatin (LIPITOR) 80 MG tablet, Take 1 tablet (80 mg total) by mouth daily at 6 PM., Disp: 30 tablet, Rfl: 3;  carvedilol (COREG) 6.25 MG tablet, Take 1 tablet (6.25 mg total) by mouth 2 (two) times daily with a meal., Disp: 60 tablet, Rfl: 3 clopidogrel (PLAVIX) 75 MG tablet, Take 1 tablet (75 mg total) by mouth daily with breakfast., Disp: 30 tablet, Rfl: 12;  enalapril (VASOTEC) 5 MG tablet, Take 1 tablet (5 mg total) by mouth daily., Disp: 60 tablet, Rfl: 3;  furosemide (LASIX) 40 MG tablet, Take 1 tablet (40 mg total) by mouth daily., Disp: 30 tablet, Rfl: 3;  polyethylene glycol (MIRALAX / GLYCOLAX) packet, Take 17 g by mouth daily., Disp: , Rfl:  spironolactone (ALDACTONE) 25 MG tablet, Take 1 tablet (25 mg total) by mouth daily., Disp: 30 tablet, Rfl: 3;  hydrocortisone (PROCTOZONE-HC) 2.5 % rectal  cream, Place 1 application rectally 2 (two) times daily., Disp: , Rfl:      Objective:   Physical Exam  Nursing note and vitals reviewed. Constitutional: He is oriented to person, place, and time. He appears well-developed and well-nourished. No distress.  Deconditioned male sitting in a wheelchair  HENT:  Head: Normocephalic and atraumatic.  Right Ear: External ear normal.  Left Ear: External ear normal.  Mouth/Throat: Oropharynx is clear and moist. No oropharyngeal exudate.  Oxygen on  Eyes: Conjunctivae and EOM are normal. Pupils are equal, round, and reactive to light. Right eye exhibits no discharge. Left eye exhibits no discharge. No scleral icterus.  Neck: Normal range of motion. Neck supple. No JVD present. No  tracheal deviation present. No thyromegaly present.  Cardiovascular: Normal rate, regular rhythm and intact distal pulses.  Exam reveals no gallop and no friction rub.   No murmur heard. Pulmonary/Chest: Effort normal. No respiratory distress. He has no wheezes. He has rales. He exhibits no tenderness.  Abdominal: Soft. Bowel sounds are normal. He exhibits no distension and no mass. There is no tenderness. There is no rebound and no guarding.  Musculoskeletal: Normal range of motion. He exhibits no edema and no tenderness.  No cyanosis and no clubbing  Lymphadenopathy:    He has no cervical adenopathy.  Neurological: He is alert and oriented to person, place, and time. He has normal reflexes. No cranial nerve deficit. Coordination normal.  Skin: Skin is warm and dry. No rash noted. He is not diaphoretic. No erythema. No pallor.  Psychiatric: He has a normal mood and affect. His behavior is normal. Judgment and thought content normal.          Assessment & Plan:

## 2013-01-15 NOTE — Assessment & Plan Note (Addendum)
He is UIP pattern of interstitial lung disease on CT scan of the chest May 2014. Etiology likely collagen vascular disease as evidenced by high scleroderma antibodies. There is no systemic evidence of scleroderma.  Plan - First  need to sort out ed if he is taking his daily prednisone or not ; it is not on his medication list today - Repeat autoimmune profile and complete more extensive autoimmune profile studies - Get full pulmonary function test - See a rheumatologist opinion - Continue oxygen and home physical therapy - I will see him back in a month after the above  I explained this to him and his cousin and they both agree   > 50% of this > 25 min visit spent in face to face counseling (15 min visit converted to 25 min)

## 2013-01-15 NOTE — Assessment & Plan Note (Signed)
To repeat CT scan of the chest in August 2014

## 2013-01-16 LAB — ANGIOTENSIN CONVERTING ENZYME: Angiotensin-Converting Enzyme: 8 U/L (ref 8–52)

## 2013-01-16 LAB — RHEUMATOID FACTOR: Rhuematoid fact SerPl-aCnc: <10 IU/mL (ref <=14)

## 2013-01-16 LAB — CYCLIC CITRUL PEPTIDE ANTIBODY, IGG: Cyclic Citrullin Peptide Ab: <2 U/mL (ref 0.0–5.0)

## 2013-01-16 LAB — ANCA SCREEN W REFLEX TITER: c-ANCA Screen: NEGATIVE

## 2013-01-17 LAB — ANTI-SCLERODERMA ANTIBODY: Scleroderma (Scl-70) (ENA) Antibody, IgG: 197 AU/mL — ABNORMAL HIGH (ref <30)

## 2013-01-20 LAB — ALPHA-1 ANTITRYPSIN PHENOTYPE: A-1 Antitrypsin: 198 mg/dL (ref 83–199)

## 2013-01-21 DIAGNOSIS — Z029 Encounter for administrative examinations, unspecified: Secondary | ICD-10-CM

## 2013-01-22 LAB — HYPERSENSITIVITY PNUEMONITIS PROFILE

## 2013-01-24 ENCOUNTER — Inpatient Hospital Stay: Payer: Medicare Other | Admitting: Pulmonary Disease

## 2013-01-29 ENCOUNTER — Ambulatory Visit (HOSPITAL_COMMUNITY)
Admission: RE | Admit: 2013-01-29 | Discharge: 2013-01-29 | Disposition: A | Discharge status: Home / Self Care | Payer: Medicare Other | Source: Ambulatory Visit | Attending: Internal Medicine | Admitting: Internal Medicine

## 2013-01-29 ENCOUNTER — Other Ambulatory Visit (HOSPITAL_COMMUNITY): Payer: Self-pay | Admitting: Vascular Surgery

## 2013-01-29 ENCOUNTER — Encounter (HOSPITAL_COMMUNITY): Payer: Self-pay

## 2013-01-29 VITALS — BP 122/58 | HR 61 | Wt 169.4 lb

## 2013-01-29 DIAGNOSIS — I10 Essential (primary) hypertension: Secondary | ICD-10-CM | POA: Insufficient documentation

## 2013-01-29 DIAGNOSIS — I5022 Chronic systolic (congestive) heart failure: Secondary | ICD-10-CM | POA: Insufficient documentation

## 2013-01-29 MED ORDER — CLOPIDOGREL BISULFATE 75 MG PO TABS: 75.0000 mg | ORAL_TABLET | Freq: Every day | ORAL | Status: DC

## 2013-01-29 MED ORDER — ENALAPRIL MALEATE 5 MG PO TABS: 5.0000 mg | ORAL_TABLET | Freq: Every day | ORAL | Status: DC

## 2013-01-29 MED ORDER — CARVEDILOL 6.25 MG PO TABS: 6.2500 mg | ORAL_TABLET | Freq: Every day | ORAL | Status: DC

## 2013-01-29 MED ORDER — ATORVASTATIN CALCIUM 80 MG PO TABS: 80.0000 mg | ORAL_TABLET | Freq: Every day | ORAL | Status: DC

## 2013-01-29 MED ORDER — FUROSEMIDE 40 MG PO TABS: 40.0000 mg | ORAL_TABLET | Freq: Every day | ORAL | Status: DC

## 2013-01-29 MED ORDER — SPIRONOLACTONE 25 MG PO TABS: 25.0000 mg | ORAL_TABLET | Freq: Every day | ORAL | Status: DC

## 2013-01-29 NOTE — Progress Notes (Signed)
Patient ID: Christopher [name], male   DOB: May 19, 1928, 79 y.o.   MRN: [id number]   Weight Range 174 pounds  Baseline proBNP    HPI: Christopher [name] is an 79 yo male with a PMH of NSTEMI- 2 vessel CAD, CAD; s/p PTCA/DES x1 mid LAD, Chronic Systolic Heart Failure EF 25%,  ICM- EF 25%, Scleroderma, Chronic renal failure , severe deconditioning, and  H/o tobacco abuse, quit 20 years ago.    He presented to [facility] on 12/20/12 with complaints of chest pressure and dyspnea that occurred with exertion and rest. He was transferred to [facility] for further work up of NSTEMI and new onset congestive heart failure. During his hospital stay ECHO showed EF 25%. He had a PCI of the LAD with Promus Premier DES on 12/25/12. He required 6 L O2 with walking in the hosptail and had a pulmonary consult. A CT of chest was ordered showing UIP pattern suggestive of pulmonary fibrosis with acute pneumonitis therefore oral steroids were given. He was also diagnosed with scleroderma (2014). He was diuresed and started on a BB, ACE-I and Cleda Daub. Discharge weight 174 pounds  He returns for follow up. Last visit enalapril was cut back to 5 mg at bed time. Evaluated by Dr Elly Modena 01/15/13 with recommendations for ongoing prednisone, PFTS, and autoimmune profile, extensive autoimmune profile studies, and  referral to rheumatology in July. Only using oxygen as needed. Still having dizziness. Mild dyspnea with ambulation. Denies PND/Orthopnea/CP. Ambulates with a rolling walker. Weight at home 165-166 pounds. Appetite ok. His nephew lives with him and his niece prepares pill box.     ROS: All systems negative except as listed in HPI, PMH and Problem List.  Past Medical History  Diagnosis Date  . Hypertension   . BPH (benign prostatic hyperplasia)   . Hemorrhoids     Current Outpatient Prescriptions  Medication Sig Dispense Refill  . aspirin EC 81 MG EC tablet Take 1 tablet (81 mg total) by mouth daily.      Marland Kitchen atorvastatin (LIPITOR) 80  MG tablet Take 1 tablet (80 mg total) by mouth daily at 6 PM.  30 tablet  3  . carvedilol (COREG) 6.25 MG tablet Take 1 tablet (6.25 mg total) by mouth 2 (two) times daily with a meal.  60 tablet  3  . clopidogrel (PLAVIX) 75 MG tablet Take 1 tablet (75 mg total) by mouth daily with breakfast.  30 tablet  12  . enalapril (VASOTEC) 5 MG tablet Take 1 tablet (5 mg total) by mouth daily.  60 tablet  3  . furosemide (LASIX) 40 MG tablet Take 1 tablet (40 mg total) by mouth daily.  30 tablet  3  . hydrocortisone (PROCTOZONE-HC) 2.5 % rectal cream Place 1 application rectally 2 (two) times daily.      . polyethylene glycol (MIRALAX / GLYCOLAX) packet Take 17 g by mouth daily.      Marland Kitchen spironolactone (ALDACTONE) 25 MG tablet Take 1 tablet (25 mg total) by mouth daily.  30 tablet  3   No current facility-administered medications for this encounter.     PHYSICAL EXAM: Filed Vitals:   01/29/13 1305  BP: 122/58  Pulse: 61  Weight: 169 lb 6.4 oz (76.839 kg)  SpO2: 92%    General: Elderly appearing. No resp difficulty HEENT: normal Neck: supple. JVP flat. Carotids 2+ bilaterally; no bruits. No lymphadenopathy or thryomegaly appreciated. Cor: PMI normal. Regular rate & rhythm. No rubs, gallops or murmurs. Lungs: clear Abdomen: soft,  nontender, nondistended. No hepatosplenomegaly. No bruits or masses. Good bowel sounds. Extremities: no cyanosis, clubbing, rash, edema Neuro: alert & orientedx3, cranial nerves grossly intact. Moves all 4 extremities w/o difficulty. Affect pleasant.   ASSESSMENT & PLAN:

## 2013-01-29 NOTE — Patient Instructions (Addendum)
Follow up in 1 month  Take carvedilol 6.25 mg at bed time only  Hold lasix if your weight is < 165 pounds.   Do the following things EVERYDAY: 1) Weigh yourself in the morning before breakfast. Write it down and keep it in a log. 2) Take your medicines as prescribed 3) Eat low salt foods-Limit salt (sodium) to 2000 mg per day.  4) Stay as active as you can everyday 5) Limit all fluids for the day to less than 2 liters

## 2013-01-29 NOTE — Assessment & Plan Note (Signed)
NYHA IIIB. Overall he doing better but still dyspneic with exertion. Volume status stable continue current diuretic regimen. He is instructed to hold lasix if his weight is less than 165 pounds. Due to dizziness will stop day time carvedilol and continue 6.25 mg carvedilol at bed time. Follow up in 1 month.

## 2013-02-25 ENCOUNTER — Encounter (HOSPITAL_COMMUNITY): Payer: Self-pay

## 2013-02-25 ENCOUNTER — Ambulatory Visit (HOSPITAL_COMMUNITY)
Admission: RE | Admit: 2013-02-25 | Discharge: 2013-02-25 | Disposition: A | Discharge status: Home / Self Care | Payer: Medicare Other | Source: Ambulatory Visit | Attending: Internal Medicine | Admitting: Internal Medicine

## 2013-02-25 ENCOUNTER — Ambulatory Visit: Payer: Medicare Other | Admitting: Internal Medicine

## 2013-02-25 VITALS — BP 110/60 | HR 63 | Wt 168.0 lb

## 2013-02-25 DIAGNOSIS — I129 Hypertensive chronic kidney disease with stage 1 through stage 4 chronic kidney disease, or unspecified chronic kidney disease: Secondary | ICD-10-CM | POA: Insufficient documentation

## 2013-02-25 DIAGNOSIS — Z87891 Personal history of nicotine dependence: Secondary | ICD-10-CM | POA: Insufficient documentation

## 2013-02-25 DIAGNOSIS — Z7982 Long term (current) use of aspirin: Secondary | ICD-10-CM | POA: Insufficient documentation

## 2013-02-25 DIAGNOSIS — M349 Systemic sclerosis, unspecified: Secondary | ICD-10-CM | POA: Insufficient documentation

## 2013-02-25 DIAGNOSIS — I5022 Chronic systolic (congestive) heart failure: Secondary | ICD-10-CM | POA: Insufficient documentation

## 2013-02-25 DIAGNOSIS — Z7902 Long term (current) use of antithrombotics/antiplatelets: Secondary | ICD-10-CM | POA: Insufficient documentation

## 2013-02-25 DIAGNOSIS — N189 Chronic kidney disease, unspecified: Secondary | ICD-10-CM | POA: Insufficient documentation

## 2013-02-25 DIAGNOSIS — K649 Unspecified hemorrhoids: Secondary | ICD-10-CM | POA: Insufficient documentation

## 2013-02-25 DIAGNOSIS — N400 Enlarged prostate without lower urinary tract symptoms: Secondary | ICD-10-CM | POA: Insufficient documentation

## 2013-02-25 DIAGNOSIS — Z9861 Coronary angioplasty status: Secondary | ICD-10-CM | POA: Insufficient documentation

## 2013-02-25 DIAGNOSIS — I2510 Atherosclerotic heart disease of native coronary artery without angina pectoris: Secondary | ICD-10-CM | POA: Insufficient documentation

## 2013-02-25 DIAGNOSIS — Z79899 Other long term (current) drug therapy: Secondary | ICD-10-CM | POA: Insufficient documentation

## 2013-02-25 DIAGNOSIS — I252 Old myocardial infarction: Secondary | ICD-10-CM | POA: Insufficient documentation

## 2013-02-25 MED ORDER — FUROSEMIDE 40 MG PO TABS: 40.0000 mg | ORAL_TABLET | Freq: Every day | ORAL | Status: DC

## 2013-02-25 MED ORDER — SPIRONOLACTONE 25 MG PO TABS: 25.0000 mg | ORAL_TABLET | Freq: Every day | ORAL | Status: DC

## 2013-02-25 MED ORDER — CLOPIDOGREL BISULFATE 75 MG PO TABS: 75.0000 mg | ORAL_TABLET | Freq: Every day | ORAL | Status: DC

## 2013-02-25 MED ORDER — ENALAPRIL MALEATE 5 MG PO TABS: 5.0000 mg | ORAL_TABLET | Freq: Every day | ORAL | Status: DC

## 2013-02-25 MED ORDER — ATORVASTATIN CALCIUM 80 MG PO TABS: 80.0000 mg | ORAL_TABLET | Freq: Every day | ORAL | Status: DC

## 2013-02-25 MED ORDER — CARVEDILOL 6.25 MG PO TABS: 6.2500 mg | ORAL_TABLET | Freq: Every day | ORAL | Status: DC

## 2013-02-25 NOTE — Assessment & Plan Note (Signed)
NYHA II. Volume status stable. Continue current diuretic regimen. Would like to titrate HF medications but due to dizziness will hold off. Continue current dose of carvedilol due to dizziness. Reinforced daily weights, low salt food choices, and limiting fluid intake to < 2 liters per day.  Follow up in 2 months.

## 2013-02-25 NOTE — Patient Instructions (Addendum)
Follow up in 2 months  Do the following things EVERYDAY: 1) Weigh yourself in the morning before breakfast. Write it down and keep it in a log. 2) Take your medicines as prescribed 3) Eat low salt foods-Limit salt (sodium) to 2000 mg per day.  4) Stay as active as you can everyday 5) Limit all fluids for the day to less than 2 liters 

## 2013-02-25 NOTE — Progress Notes (Signed)
Patient ID: Christopher [name], male   DOB: October 14, 1927, 79 y.o.   MRN: [id number]   Weight Range 174 pounds  Baseline proBNP   PCP: Dr Ramond Craver   HPI: Christopher [name] is an 79 yo male with a PMH of NSTEMI- 2 vessel CAD, CAD; s/p PTCA/DES x1 mid LAD, Chronic Systolic Heart Failure EF 25%,  ICM- EF 25%, Scleroderma, Chronic renal failure , severe deconditioning, and  H/o tobacco abuse, quit 20 years ago.   He presented to [facility] on 12/20/12 with complaints of chest pressure and dyspnea that occurred with exertion and rest. He was transferred to [facility] for further work up of NSTEMI and new onset congestive heart failure. During his hospital stay ECHO showed EF 25%. He had a PCI of the LAD with Promus Premier DES on 12/25/12. He required 6 L O2 with walking in the hosptail and had a pulmonary consult. A CT of chest was ordered showing UIP pattern suggestive of pulmonary fibrosis with acute pneumonitis therefore oral steroids were given. He was also diagnosed with scleroderma (2014). He was diuresed and started on a BB, ACE-I and Cleda Daub. Discharge weight 174 pounds.   He returns for follow up. Last visit he was instructed to hold lasix if his weight is less than 165 pounds and day time carvedilol was stopped and he was continue 6.25 mg carvedilol at bed time due to dizziness. Denies SOB/PND/Orthopnea/CP. Dizziness improved.  Ambulates with rolling walker. Weight at home 165-166 pounds. His nephew lives with him and his niece prepares pill box.     ROS: All systems negative except as listed in HPI, PMH and Problem List.  Past Medical History  Diagnosis Date  . Hypertension   . BPH (benign prostatic hyperplasia)   . Hemorrhoids     Current Outpatient Prescriptions  Medication Sig Dispense Refill  . aspirin EC 81 MG EC tablet Take 1 tablet (81 mg total) by mouth daily.      Marland Kitchen atorvastatin (LIPITOR) 80 MG tablet Take 1 tablet (80 mg total) by mouth daily at 6 PM.  30 tablet  3  . carvedilol (COREG) 6.25 MG  tablet Take 1 tablet (6.25 mg total) by mouth at bedtime.  30 tablet  3  . clopidogrel (PLAVIX) 75 MG tablet Take 1 tablet (75 mg total) by mouth daily with breakfast.  30 tablet  12  . enalapril (VASOTEC) 5 MG tablet Take 1 tablet (5 mg total) by mouth at bedtime.  30 tablet  3  . furosemide (LASIX) 40 MG tablet Take 1 tablet (40 mg total) by mouth daily.  30 tablet  3  . hydrocortisone (PROCTOZONE-HC) 2.5 % rectal cream Place 1 application rectally 2 (two) times daily.      . polyethylene glycol (MIRALAX / GLYCOLAX) packet Take 17 g by mouth daily.      Marland Kitchen spironolactone (ALDACTONE) 25 MG tablet Take 1 tablet (25 mg total) by mouth daily.  30 tablet  3   No current facility-administered medications for this encounter.     PHYSICAL EXAM: Filed Vitals:   02/25/13 1429  BP: 110/60  Pulse: 63  Weight: 168 lb (76.204 kg)  SpO2: 96%    General: Elderly appearing. No resp difficulty HEENT: normal Neck: supple. JVP ~5-6. Carotids 2+ bilaterally; no bruits. No lymphadenopathy or thryomegaly appreciated. Cor: PMI normal. Regular rate & rhythm. No rubs, gallops or murmurs. Lungs: clear Abdomen: soft, nontender, nondistended. No hepatosplenomegaly. No bruits or masses. Good bowel sounds. Extremities: no cyanosis, clubbing, rash,  edema Neuro: alert & orientedx3, cranial nerves grossly intact. Moves all 4 extremities w/o difficulty. Affect pleasant.   ASSESSMENT & PLAN:

## 2013-02-26 ENCOUNTER — Encounter (HOSPITAL_COMMUNITY): Payer: Medicare Other

## 2013-03-05 ENCOUNTER — Telehealth: Payer: Self-pay | Admitting: Internal Medicine

## 2013-03-05 NOTE — Telephone Encounter (Signed)
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 He recommended pulm htn workup as well.  Patient has ILD as well.   Please get ptient in first avail . I will need to talk to him about cellcept and PAH workup  Dr. Kalman Shan, M.D., [facility]C.P Pulmonary and Critical Care Medicine Staff Physician [facility] [place] [facility] Pager: [phone], If no answer or between  15:00h - 7:00h: call [phone]  03/05/2013 5:50 PM

## 2013-03-13 NOTE — Telephone Encounter (Signed)
Pt has an appt on 04-17-13 at 3pm for pft and rov. Carron Curie, CMA

## 2013-04-17 ENCOUNTER — Telehealth: Payer: Self-pay | Admitting: Internal Medicine

## 2013-04-17 ENCOUNTER — Ambulatory Visit (INDEPENDENT_AMBULATORY_CARE_PROVIDER_SITE_OTHER): Payer: Medicare Other | Admitting: Internal Medicine

## 2013-04-17 ENCOUNTER — Encounter: Payer: Self-pay | Admitting: Internal Medicine

## 2013-04-17 VITALS — BP 122/68 | HR 82 | Temp 97.7°F | Ht 67.0 in | Wt 168.0 lb

## 2013-04-17 DIAGNOSIS — J849 Interstitial pulmonary disease, unspecified: Secondary | ICD-10-CM

## 2013-04-17 DIAGNOSIS — R911 Solitary pulmonary nodule: Secondary | ICD-10-CM

## 2013-04-17 DIAGNOSIS — J8410 Pulmonary fibrosis, unspecified: Secondary | ICD-10-CM

## 2013-04-17 LAB — PULMONARY FUNCTION TEST

## 2013-04-17 NOTE — Telephone Encounter (Signed)
jen  I forgot to tell them that he needs a repeat CT scan of the chest because of lung nodule and also to evaluate status of interstitial lung disease. I have ordered this please make sure they do this thank you\\  Dr. Kalman Shan, M.D., [facility].C.P Pulmonary and Critical Care Medicine Staff Physician [facility] [place] Pulmonary and Critical Care Pager: [phone], If no answer or between  15:00h - 7:00h: call [phone]  04/17/2013 5:44 PM

## 2013-04-17 NOTE — Assessment & Plan Note (Signed)
Plan repeat CT scan of the chest

## 2013-04-17 NOTE — Progress Notes (Signed)
Subjective:    Patient ID: Christopher [name], male    DOB: 01-31-1928, 79 y.o.   MRN: [id number]  HPI  #CAD -  NSTEMI - 2 vessel CAD   -  s/p PTCA/DES x1 mid LAD   #  systolic heart failure - new diagnosis May 2014 - ICM with EF 25%  # Chronic renal failure   #hx of  UTI   # Severe deconditioning following admision fo CHF and MI May 2014  #H/o tobacco abuse, quit 20 years ago   #33mm RLL - MAy 2014 CT chest  #UIP Possible on CT Chest May 2014 during CHF/NSTEMI admission   -  ESR highly elevated at 103 and highly elevated scleroderma SCL-70 antibody at 230 but negative ANA rheumatoid factor p-ANCA and atypical anca though C. Anka was positive. I  - n terms of occupation he worked in the form and [employer]. Denies any found work, Building surveyor work, Therapist, sports work, asbestosis exposure or Engineer, civil (consulting) denies bird exposure. I'm not sure if he had amiodarone therapy but he denies any mold exposure.    - He's not noted have collagen basilar disease and denies any Raynaud's phenomena or dry skin or dry eyes.   - Other relevant studies done while in the hospital include  5/12 Esophagram >> disruption of primary peristalsis consistent with nonspecific esophageal motility disorder  5/12 Speech therapy swallow evaluation >> normal swallow function 5/14 PFT >> FEV1 1.76 (78%), FEV1% 84, TLC 6.26 (57%), DLCO 39%, no BD response  - In terms of therapy  on 12/31/2012 patient was given Solu-Medrol and on 01/02/2013 at the time of discharge he was given oral prednisone? 60 mg daily to continue. This was on his medication list may 22nd 2014 with cardiology but at this point in time today 01/15/2013 I do not see this on his medication list and I do not know why      OV 04/17/2013 Followup 8mm RLL lung nodule and Followup for possible UIP on CT scan of the chest with autoimmune antibody positive for scleroderma/70  He has not had fu CT chest  Saw Dr. 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 No specific intervention to her rheumatologist and point and was discharged from followup. Monitoring for pulmonary hypertension advised.   Pulmonary Function test 04/09/2013 shows FVC 3.2 L/95%. FEV1 2.8 L has 117%. Pressure 86. Total lung capacity 0.8 L/74%. DLCO 20/70%. In summary mild reduction in total lung capacity and diffusion. This appears unchanged from baseline. In terms of symptoms, he is feeling better. No dyspnea. He is doing activities of daily living. Is walking outdoors and doing things and does not have any shortness of breath. He says he is off his oxygen based advised by cardiology. There is no edema, chest pain. He's not interested in oxygen therapy. We discussed immunomodulators therapy for autoimmune lung disease and he's not interested  Past, Family, Social reviewed: no change since last visit   Review of Systems  Constitutional: Negative for fever and unexpected weight change.  HENT: Negative for ear pain, nosebleeds, congestion, sore throat, rhinorrhea, sneezing, trouble swallowing, dental problem, postnasal drip and sinus pressure.   Eyes: Negative for redness and itching.  Respiratory: Positive for shortness of breath. Negative for cough, chest tightness and wheezing.   Cardiovascular: Negative for palpitations and leg swelling.  Gastrointestinal: Negative for nausea and vomiting.  Genitourinary: Negative for dysuria.  Musculoskeletal: Negative for joint swelling.  Skin: Negative for rash.  Neurological: Negative  for headaches.  Hematological: Does not bruise/bleed easily.  Psychiatric/Behavioral: Negative for dysphoric mood. The patient is not nervous/anxious.    Current outpatient prescriptions:aspirin EC 81 MG EC tablet, Take 1 tablet (81 mg total) by mouth daily., Disp: , Rfl: ;  atorvastatin (LIPITOR) 80 MG tablet, Take 1 tablet (80 mg total) by mouth daily at 6 PM., Disp: 30 tablet, Rfl: 3;  carvedilol (COREG) 6.25 MG tablet, Take 1 tablet  (6.25 mg total) by mouth at bedtime., Disp: 30 tablet, Rfl: 3 clopidogrel (PLAVIX) 75 MG tablet, Take 1 tablet (75 mg total) by mouth daily with breakfast., Disp: 30 tablet, Rfl: 12;  enalapril (VASOTEC) 5 MG tablet, Take 1 tablet (5 mg total) by mouth at bedtime., Disp: 30 tablet, Rfl: 3;  furosemide (LASIX) 40 MG tablet, Take 1 tablet (40 mg total) by mouth daily., Disp: 30 tablet, Rfl: 3;  spironolactone (ALDACTONE) 25 MG tablet, Take 1 tablet (25 mg total) by mouth daily., Disp: 30 tablet, Rfl: 3     Objective:   Physical Exam  Nursing note and vitals reviewed. Constitutional: He is oriented to person, place, and time. He appears well-developed and well-nourished. No distress.  Pleasant Looks more conditioned  HENT:  Head: Normocephalic and atraumatic.  Right Ear: External ear normal.  Left Ear: External ear normal.  Mouth/Throat: Oropharynx is clear and moist. No oropharyngeal exudate.  Edentulous  Eyes: Conjunctivae and EOM are normal. Pupils are equal, round, and reactive to light. Right eye exhibits no discharge. Left eye exhibits no discharge. No scleral icterus.  Neck: Normal range of motion. Neck supple. No JVD present. No tracheal deviation present. No thyromegaly present.  Cardiovascular: Normal rate, regular rhythm and intact distal pulses.  Exam reveals no gallop and no friction rub.   No murmur heard. Pulmonary/Chest: Effort normal. No respiratory distress. He has no wheezes. He has rales. He exhibits no tenderness.  Abdominal: Soft. Bowel sounds are normal. He exhibits no distension and no mass. There is no tenderness. There is no rebound and no guarding.  Musculoskeletal: Normal range of motion. He exhibits no edema and no tenderness.  Scoliotic and walks with a walker  Lymphadenopathy:    He has no cervical adenopathy.  Neurological: He is alert and oriented to person, place, and time. He has normal reflexes. No cranial nerve deficit. Coordination normal.  Skin: Skin is  warm and dry. No rash noted. He is not diaphoretic. No erythema. No pallor.  Psychiatric: He has a normal mood and affect. His behavior is normal. Judgment and thought content normal.          Assessment & Plan:

## 2013-04-17 NOTE — Patient Instructions (Addendum)
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

## 2013-04-17 NOTE — Assessment & Plan Note (Addendum)
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 Possible UIP pattern. Immunomodulatory therapy is indicated in the form of low-dose prednisone and CellCept but this is toxic currently it is only mild. Side effect from therapy or extensive we discussed this. We both agreed that this is best monitored clinically. More likely that he would decline from his heart issues orage  He and his son in agreement  > 50% of this > 25 min visit spent in face to face counseling (15 min visit converted to 25 min)

## 2013-04-17 NOTE — Progress Notes (Signed)
PFT done today. 

## 2013-04-18 NOTE — Telephone Encounter (Signed)
Scheduled and pt is aware. Christopher [name], CMA

## 2013-04-29 ENCOUNTER — Other Ambulatory Visit: Payer: Medicare Other

## 2013-04-30 ENCOUNTER — Ambulatory Visit (INDEPENDENT_AMBULATORY_CARE_PROVIDER_SITE_OTHER)
Admission: RE | Admit: 2013-04-30 | Discharge: 2013-04-30 | Disposition: A | Discharge status: Home / Self Care | Payer: Medicare Other | Source: Ambulatory Visit | Attending: Internal Medicine | Admitting: Internal Medicine

## 2013-04-30 ENCOUNTER — Ambulatory Visit (HOSPITAL_COMMUNITY)
Admission: RE | Admit: 2013-04-30 | Discharge: 2013-04-30 | Disposition: A | Discharge status: Home / Self Care | Payer: Medicare Other | Source: Ambulatory Visit | Attending: Internal Medicine | Admitting: Internal Medicine

## 2013-04-30 VITALS — BP 122/64 | HR 55 | Wt 170.0 lb

## 2013-04-30 DIAGNOSIS — J8410 Pulmonary fibrosis, unspecified: Secondary | ICD-10-CM

## 2013-04-30 DIAGNOSIS — J849 Interstitial pulmonary disease, unspecified: Secondary | ICD-10-CM

## 2013-04-30 DIAGNOSIS — I5022 Chronic systolic (congestive) heart failure: Secondary | ICD-10-CM | POA: Insufficient documentation

## 2013-04-30 DIAGNOSIS — I2510 Atherosclerotic heart disease of native coronary artery without angina pectoris: Secondary | ICD-10-CM | POA: Insufficient documentation

## 2013-04-30 DIAGNOSIS — R911 Solitary pulmonary nodule: Secondary | ICD-10-CM

## 2013-04-30 LAB — BASIC METABOLIC PANEL
CO2: 26 mEq/L (ref 19–32)
Calcium: 9.2 mg/dL (ref 8.4–10.5)
GFR calc non Af Amer: 78 mL/min — ABNORMAL LOW (ref >90)
Glucose, Bld: 88 mg/dL (ref 70–99)
Potassium: 3.5 mEq/L (ref 3.5–5.1)
Sodium: 140 mEq/L (ref 135–145)

## 2013-04-30 MED ORDER — CARVEDILOL 3.125 MG PO TABS: 3.1250 mg | ORAL_TABLET | Freq: Every day | ORAL | Status: DC

## 2013-04-30 NOTE — Progress Notes (Signed)
Patient ID: Christopher [name], male   DOB: Dec 22, 1927, 79 y.o.   MRN: [id number]   Weight Range 174 pounds  Baseline proBNP   PCP: Dr Christopher [name])   HPI: Christopher [name] is an 79 yo male with a PMH of NSTEMI- 2 vessel CAD, CAD; s/p PTCA/DES x1 mid LAD, Chronic Systolic Heart Failure,  ICM- EF 25%, Scleroderma, Chronic renal failure , severe deconditioning, and  H/o tobacco abuse, quit 20 years ago.   He presented to [facility] on 12/20/12 with complaints of chest pressure and dyspnea that occurred with exertion and rest. He was transferred to [facility] for further work up of NSTEMI and new onset congestive heart failure. During his hospital stay ECHO showed EF 25%. He had a PCI of the LAD with Promus Premier DES on 12/25/12. He required 6 L O2 with walking in the hosptail and had a pulmonary consult. A CT of chest was ordered showing UIP pattern suggestive of pulmonary fibrosis with acute pneumonitis therefore oral steroids were given. He was also diagnosed with scleroderma (2014). He was diuresed and started on a BB, ACE-I and Christopher [name]. Discharge weight 174 pounds.   Follow up:  Doing pretty good. Walks with either a walker or rollator. Weight at home 165-167 lbs. No longer taking ASA 81 mg, he stopped d/t bruising. Denies PND/Orthopnea/CP or edema. Mild SOB that comes and goes with activity. Still complains of dizziness with walking and getting up too quick.    SH: His nephew lives with him and his niece prepares pill box.   ROS: All systems negative except as listed in HPI, PMH and Problem List.  Past Medical History  Diagnosis Date  . Hypertension   . BPH (benign prostatic hyperplasia)   . Hemorrhoids     Current Outpatient Prescriptions  Medication Sig Dispense Refill  . atorvastatin (LIPITOR) 80 MG tablet Take 1 tablet (80 mg total) by mouth daily at 6 PM.  30 tablet  3  . carvedilol (COREG) 6.25 MG tablet Take 1 tablet (6.25 mg total) by mouth at bedtime.  30 tablet  3  . clopidogrel (PLAVIX) 75 MG  tablet Take 1 tablet (75 mg total) by mouth daily with breakfast.  30 tablet  12  . enalapril (VASOTEC) 5 MG tablet Take 1 tablet (5 mg total) by mouth at bedtime.  30 tablet  3  . furosemide (LASIX) 40 MG tablet Take 1 tablet (40 mg total) by mouth daily.  30 tablet  3  . spironolactone (ALDACTONE) 25 MG tablet Take 1 tablet (25 mg total) by mouth daily.  30 tablet  3   No current facility-administered medications for this encounter.    Filed Vitals:   04/30/13 1317  BP: 122/64  Pulse: 55  Weight: 170 lb (77.111 kg)  SpO2: 96%   PHYSICAL EXAM: General: Elderly appearing. No resp difficulty; cousin with him HEENT: normal Neck: supple. JVP ~6-7. Carotids 2+ bilaterally; no bruits. No lymphadenopathy or thryomegaly appreciated. Cor: PMI normal. Regular rate & rhythm. No rubs, gallops or murmurs. Lungs: clear Abdomen: soft, nontender, nondistended. No hepatosplenomegaly. No bruits or masses. Good bowel sounds. Extremities: no cyanosis, clubbing, rash, edema Neuro: alert & orientedx3, cranial nerves grossly intact. Moves all 4 extremities w/o difficulty. Affect pleasant.  ASSESSMENT & PLAN:  1) Chronic systolic HF, ICM, EF 25-30% - NYHA II/III symptoms. Volume status is good. - With dizziness, and low HR will cut BB back to 3.125 mg daily. - Continue enalapril 5 mg daily, spiro 25 mg  daily and lasix 40 mg daily.  - Discussion with patient about risk of SCD with EF < 35%, patient is elderly however still getting around doing what he wants. He says he understands and would like to hold off right now for referral. Will get ECHO in 2 months at next visit and reassess. -Reinforced the need and importance of daily weights, a low sodium diet, and fluid restriction (less than 2 L a day). Instructed to call the HF clinic if weight increases more than 3 lbs overnight or 5 lbs in a week.   2) CAD 2V dz, s/p PTCA/DES x1 mid LAD - no s/s of ischemia - continue low dose BB, statin and ACE-I -  recommended that patient restart taking ASA 81 mg daily  Christopher Potash B NP-C 6:18 PM

## 2013-04-30 NOTE — Patient Instructions (Addendum)
Recommend restarting low dose 81 mg of Aspirin.  Cut carvedilol back to 3.125 mg at night.   Will follow up in 2 months with ECHO.  If any issues call us [phone]  Continue to walk with cane or walker

## 2013-05-05 ENCOUNTER — Encounter: Payer: Self-pay | Admitting: Internal Medicine

## 2013-05-09 ENCOUNTER — Other Ambulatory Visit: Payer: Self-pay | Admitting: Internal Medicine

## 2013-05-09 DIAGNOSIS — R911 Solitary pulmonary nodule: Secondary | ICD-10-CM

## 2013-07-10 ENCOUNTER — Ambulatory Visit (HOSPITAL_COMMUNITY)
Admission: RE | Admit: 2013-07-10 | Discharge: 2013-07-10 | Disposition: A | Discharge status: Home / Self Care | Payer: Medicare Other | Source: Ambulatory Visit | Attending: Internal Medicine | Admitting: Internal Medicine

## 2013-07-10 ENCOUNTER — Ambulatory Visit (HOSPITAL_BASED_OUTPATIENT_CLINIC_OR_DEPARTMENT_OTHER)
Admission: RE | Admit: 2013-07-10 | Discharge: 2013-07-10 | Disposition: A | Discharge status: Home / Self Care | Payer: Medicare Other | Source: Ambulatory Visit | Attending: Internal Medicine | Admitting: Internal Medicine

## 2013-07-10 VITALS — BP 146/68 | HR 66 | Wt 176.8 lb

## 2013-07-10 DIAGNOSIS — Z7902 Long term (current) use of antithrombotics/antiplatelets: Secondary | ICD-10-CM | POA: Insufficient documentation

## 2013-07-10 DIAGNOSIS — I5022 Chronic systolic (congestive) heart failure: Secondary | ICD-10-CM

## 2013-07-10 DIAGNOSIS — I10 Essential (primary) hypertension: Secondary | ICD-10-CM

## 2013-07-10 DIAGNOSIS — Z9861 Coronary angioplasty status: Secondary | ICD-10-CM | POA: Insufficient documentation

## 2013-07-10 DIAGNOSIS — Z87891 Personal history of nicotine dependence: Secondary | ICD-10-CM | POA: Insufficient documentation

## 2013-07-10 DIAGNOSIS — N400 Enlarged prostate without lower urinary tract symptoms: Secondary | ICD-10-CM | POA: Insufficient documentation

## 2013-07-10 DIAGNOSIS — I509 Heart failure, unspecified: Secondary | ICD-10-CM | POA: Insufficient documentation

## 2013-07-10 DIAGNOSIS — Z79899 Other long term (current) drug therapy: Secondary | ICD-10-CM | POA: Insufficient documentation

## 2013-07-10 DIAGNOSIS — I059 Rheumatic mitral valve disease, unspecified: Secondary | ICD-10-CM

## 2013-07-10 DIAGNOSIS — N189 Chronic kidney disease, unspecified: Secondary | ICD-10-CM | POA: Insufficient documentation

## 2013-07-10 DIAGNOSIS — I2510 Atherosclerotic heart disease of native coronary artery without angina pectoris: Secondary | ICD-10-CM | POA: Insufficient documentation

## 2013-07-10 DIAGNOSIS — I252 Old myocardial infarction: Secondary | ICD-10-CM | POA: Insufficient documentation

## 2013-07-10 DIAGNOSIS — M349 Systemic sclerosis, unspecified: Secondary | ICD-10-CM | POA: Insufficient documentation

## 2013-07-10 LAB — BASIC METABOLIC PANEL
CO2: 25 mEq/L (ref 19–32)
Calcium: 8.9 mg/dL (ref 8.4–10.5)
Creatinine, Ser: 0.85 mg/dL (ref 0.50–1.35)
GFR calc non Af Amer: 78 mL/min — ABNORMAL LOW (ref >90)
Glucose, Bld: 125 mg/dL — ABNORMAL HIGH (ref 70–99)
Sodium: 140 mEq/L (ref 135–145)

## 2013-07-10 MED ORDER — CLOPIDOGREL BISULFATE 75 MG PO TABS: 75.0000 mg | ORAL_TABLET | Freq: Every day | ORAL | Status: DC

## 2013-07-10 MED ORDER — SPIRONOLACTONE 25 MG PO TABS: 25.0000 mg | ORAL_TABLET | Freq: Every day | ORAL | Status: DC

## 2013-07-10 MED ORDER — ENALAPRIL MALEATE 5 MG PO TABS: 5.0000 mg | ORAL_TABLET | Freq: Every day | ORAL | Status: DC

## 2013-07-10 MED ORDER — FUROSEMIDE 40 MG PO TABS: 40.0000 mg | ORAL_TABLET | Freq: Every day | ORAL | Status: DC

## 2013-07-10 MED ORDER — ATORVASTATIN CALCIUM 80 MG PO TABS: 80.0000 mg | ORAL_TABLET | Freq: Every day | ORAL | Status: DC

## 2013-07-10 MED ORDER — CARVEDILOL 3.125 MG PO TABS: 3.1250 mg | ORAL_TABLET | Freq: Every day | ORAL | Status: DC

## 2013-07-10 NOTE — Progress Notes (Signed)
Echocardiogram 2D Echocardiogram has been performed.  Trinitey Roache 07/10/2013, 11:52 AM

## 2013-07-10 NOTE — Patient Instructions (Signed)
Doing a great job.  Keep taking all of your medications the same.  Have a wonderful Holiday Season.   Call any issues [phone].  Follow up 6 months  Do the following things EVERYDAY: 1) Weigh yourself in the morning before breakfast. Write it down and keep it in a log. 2) Take your medicines as prescribed 3) Eat low salt foods-Limit salt (sodium) to 2000 mg per day.  4) Stay as active as you can everyday 5) Limit all fluids for the day to less than 2 liters 6)

## 2013-07-10 NOTE — Progress Notes (Addendum)
Patient ID: Christopher [name], male   DOB: 02/10/1928, 79 y.o.   MRN: [id number]  Weight Range 174 pounds  Baseline proBNP   PCP: Dr Margo Common  HPI: [name] is an 79 yo male with a PMH of NSTEMI- 2 vessel CAD, CAD; s/p PTCA/DES x1 mid LAD, Chronic Systolic Heart Failure EF 25%,  ICM- EF 25%, Scleroderma, Chronic renal failure , severe deconditioning, and  H/o tobacco abuse, quit 20 years ago.   He presented to [facility] on 12/20/12 with complaints of chest pressure and dyspnea that occurred with exertion and rest. He was transferred to [facility] for further work up of NSTEMI and new onset congestive heart failure. During his hospital stay ECHO showed EF 25%. He had a PCI of the LAD with Promus Premier DES on 12/25/12. He required 6 L O2 with walking in the hosptail and had a pulmonary consult. A CT of chest was ordered showing UIP pattern suggestive of pulmonary fibrosis with acute pneumonitis therefore oral steroids were given. He was also diagnosed with scleroderma (2014). He was diuresed and started on a BB, ACE-I and Cleda Daub. Discharge weight 174 pounds.   Follow up: Doing well. Denies SOB, orthopnea, CP or edema. Still having dizziness that comes and goes. Uses a rollator and cane. Weight at home 167-171 lbs. Taking medications as prescribed. Following a low salt diet, and drinking less than 2L a day. His nephew lives with him and his niece prepares pill box. + SOB with going up inclines  ROS: All systems negative except as listed in HPI, PMH and Problem List.  Past Medical History  Diagnosis Date  . Hypertension   . BPH (benign prostatic hyperplasia)   . Hemorrhoids     Current Outpatient Prescriptions  Medication Sig Dispense Refill  . atorvastatin (LIPITOR) 80 MG tablet Take 1 tablet (80 mg total) by mouth daily at 6 PM.  30 tablet  3  . carvedilol (COREG) 3.125 MG tablet Take 1 tablet (3.125 mg total) by mouth at bedtime.  30 tablet  3  . clopidogrel (PLAVIX) 75 MG tablet Take 1 tablet (75  mg total) by mouth daily with breakfast.  30 tablet  12  . enalapril (VASOTEC) 5 MG tablet Take 1 tablet (5 mg total) by mouth at bedtime.  30 tablet  3  . furosemide (LASIX) 40 MG tablet Take 1 tablet (40 mg total) by mouth daily.  30 tablet  3  . spironolactone (ALDACTONE) 25 MG tablet Take 1 tablet (25 mg total) by mouth daily.  30 tablet  3   No current facility-administered medications for this encounter.    Filed Vitals:   07/10/13 1214  BP: 146/68  Pulse: 66  Weight: 176 lb 12 oz (80.173 kg)  SpO2: 96%    PHYSICAL EXAM: General: Elderly appearing. No resp difficulty; nephew present HEENT: normal Neck: supple. JVP 7. Carotids 2+ bilaterally; no bruits. No lymphadenopathy or thryomegaly appreciated. Cor: PMI normal. Regular rate & rhythm. No rubs, gallops or murmurs. Lungs: clear Abdomen: soft, nontender, nondistended. No hepatosplenomegaly. No bruits or masses. Good bowel sounds. Extremities: no cyanosis, clubbing, rash, edema Neuro: alert & orientedx3, cranial nerves grossly intact. Moves all 4 extremities w/o difficulty. Affect pleasant.   ASSESSMENT & PLAN:  1) CAD - no s/s of ischemia - will continue statin, BB, ACE-I and plavix 2) Chronic systolic HF: ICM, EF improved 19-14% (07/10/13)   - NYHA II-III symptoms. Volume status stable. - Will not titrate any medications with continued dizziness,  more concern of patient falling. - Continue current dose BB, ACE-I and Cleda Daub    - check BMET and pro-BNP today.  - Reinforced the need and importance of daily weights, a low sodium diet, and fluid restriction (less than 2 L a day). Instructed to call the HF clinic if weight increases more than 3 lbs overnight or 5 lbs in a week.  3) HTN - SBP slightly elevated today, however as above will not titrate any medications with dizziness.   F/U 6 months Ulla Potash B NP-C 12:41 PM

## 2013-12-01 ENCOUNTER — Ambulatory Visit (INDEPENDENT_AMBULATORY_CARE_PROVIDER_SITE_OTHER)
Admission: RE | Admit: 2013-12-01 | Discharge: 2013-12-01 | Disposition: A | Discharge status: Home / Self Care | Payer: Medicare Other | Source: Ambulatory Visit | Attending: Internal Medicine | Admitting: Internal Medicine

## 2013-12-01 DIAGNOSIS — R911 Solitary pulmonary nodule: Secondary | ICD-10-CM

## 2013-12-29 ENCOUNTER — Encounter (HOSPITAL_COMMUNITY): Payer: Self-pay

## 2014-01-05 ENCOUNTER — Ambulatory Visit: Payer: Medicare Other | Admitting: Internal Medicine

## 2014-01-06 ENCOUNTER — Ambulatory Visit (HOSPITAL_COMMUNITY)
Admission: RE | Admit: 2014-01-06 | Discharge: 2014-01-06 | Disposition: A | Discharge status: Home / Self Care | Payer: Medicare Other | Source: Ambulatory Visit | Attending: Internal Medicine | Admitting: Internal Medicine

## 2014-01-06 VITALS — BP 142/64 | HR 82 | Wt 179.0 lb

## 2014-01-06 DIAGNOSIS — N400 Enlarged prostate without lower urinary tract symptoms: Secondary | ICD-10-CM | POA: Insufficient documentation

## 2014-01-06 DIAGNOSIS — R5381 Other malaise: Secondary | ICD-10-CM | POA: Insufficient documentation

## 2014-01-06 DIAGNOSIS — Z87891 Personal history of nicotine dependence: Secondary | ICD-10-CM | POA: Insufficient documentation

## 2014-01-06 DIAGNOSIS — Z9861 Coronary angioplasty status: Secondary | ICD-10-CM | POA: Insufficient documentation

## 2014-01-06 DIAGNOSIS — I10 Essential (primary) hypertension: Secondary | ICD-10-CM

## 2014-01-06 DIAGNOSIS — Z7902 Long term (current) use of antithrombotics/antiplatelets: Secondary | ICD-10-CM | POA: Insufficient documentation

## 2014-01-06 DIAGNOSIS — I2510 Atherosclerotic heart disease of native coronary artery without angina pectoris: Secondary | ICD-10-CM | POA: Insufficient documentation

## 2014-01-06 DIAGNOSIS — I2589 Other forms of chronic ischemic heart disease: Secondary | ICD-10-CM | POA: Insufficient documentation

## 2014-01-06 DIAGNOSIS — I252 Old myocardial infarction: Secondary | ICD-10-CM | POA: Insufficient documentation

## 2014-01-06 DIAGNOSIS — I509 Heart failure, unspecified: Secondary | ICD-10-CM | POA: Insufficient documentation

## 2014-01-06 DIAGNOSIS — I5022 Chronic systolic (congestive) heart failure: Secondary | ICD-10-CM

## 2014-01-06 DIAGNOSIS — I129 Hypertensive chronic kidney disease with stage 1 through stage 4 chronic kidney disease, or unspecified chronic kidney disease: Secondary | ICD-10-CM | POA: Insufficient documentation

## 2014-01-06 DIAGNOSIS — N189 Chronic kidney disease, unspecified: Secondary | ICD-10-CM | POA: Insufficient documentation

## 2014-01-06 MED ORDER — METOPROLOL SUCCINATE ER 25 MG PO TB24: 25.0000 mg | ORAL_TABLET | Freq: Every day | ORAL | Status: DC

## 2014-01-06 NOTE — Progress Notes (Signed)
Patient ID: Christopher [name], male   DOB: [birth year], 80 y.o.   MRN: [id number]  Weight Range 174 pounds  Baseline proBNP   PCP: Dr Margo Commonapper  HPI: Christopher [name] is an 80 yo male with a PMH of NSTEMI- 2 vessel CAD, CAD; s/p PTCA/DES x1 mid LAD, Chronic Systolic Heart Failure EF 25%,  ICM- EF 25%, Scleroderma, Chronic renal failure , severe deconditioning, and  H/o tobacco abuse, quit 20 years ago.   He presented to [facility] on 12/20/12 with complaints of chest pressure and dyspnea that occurred with exertion and rest. [facility] was [facility] for further work up of NSTEMI and new onset congestive heart failure. During his hospital stay ECHO showed EF 25%. He had a PCI of the LAD with Promus Premier DES on 12/25/12. He required 6 L O2 with walking in the hosptail and had a pulmonary consult. A CT of chest was ordered showing UIP pattern suggestive of pulmonary fibrosis with acute pneumonitis therefore oral steroids were given. He was also diagnosed with scleroderma (2014). He was diuresed and started on a BB, ACE-I and Cleda DaubSpiro. Discharge weight 174 pounds.   He returns for follow up. Carvedilol was stopped at some point but he is unsure when and thinks it was due to dizziness. Denies SOB/PND/Orthopnea. Ongoing dizziness.  He is busy with his garden. Weight at home 166-167 pounds. Taking all medications. Using rollator.   SH: Nephew lives with him.    ROS: All systems negative except as listed in HPI, PMH and Problem List.  Past Medical History  Diagnosis Date  . Hypertension   . BPH (benign prostatic hyperplasia)   . Hemorrhoids     Current Outpatient Prescriptions  Medication Sig Dispense Refill  . atorvastatin (LIPITOR) 80 MG tablet Take 1 tablet (80 mg total) by mouth daily at 6 PM.  30 tablet  6  . clopidogrel (PLAVIX) 75 MG tablet Take 1 tablet (75 mg total) by mouth daily with breakfast.  30 tablet  12  . enalapril (VASOTEC) 5 MG tablet Take 1 tablet (5 mg total) by mouth at bedtime.   30 tablet  6  . furosemide (LASIX) 40 MG tablet Take 1 tablet (40 mg total) by mouth daily.  30 tablet  6  . spironolactone (ALDACTONE) 25 MG tablet Take 1 tablet (25 mg total) by mouth daily.  30 tablet  6   No current facility-administered medications for this encounter.    Filed Vitals:   01/06/14 0901  BP: 142/64  Pulse: 82  Weight: 179 lb (81.194 kg)  SpO2: 96%    PHYSICAL EXAM: General: Elderly appearing. No resp difficulty; nephew present HEENT: normal Neck: supple. JVP 5-6. Carotids 2+ bilaterally; no bruits. No lymphadenopathy or thryomegaly appreciated. Cor: PMI normal. Regular rate & rhythm. No rubs, gallops or murmurs. Lungs: RLL and LLL fine crackles Abdomen: soft, nontender, nondistended. No hepatosplenomegaly. No bruits or masses. Good bowel sounds. Extremities: no cyanosis, clubbing, rash, edema Neuro: alert & orientedx3, cranial nerves grossly intact. Moves all 4 extremities w/o difficulty. Affect pleasant.   ASSESSMENT & PLAN:  1) CAD - no s/s of ischemia - will continue statin, BB, ACE-I and plavix 2) Chronic systolic HF: ICM, EF improved 69-62%45-50%  (07/10/13)   - NYHA II-III symptoms. Volume status stable.Continue lasix 40 mg daily and 25 mg spironolactone daily.  - Not on BB for some reason he says it was stopped due to dizziness.  Orthostatic BP-->  Sitting 126/56 Standing 114/62 Continue  Enalapril 5 mg at bed time.  Try Metoprolol XL 25 mg at bed time.  - Reinforced the need and importance of daily weights, a low sodium diet, and fluid restriction (less than 2 L a day). Instructed to call the HF clinic if weight increases more than 3 lbs overnight or 5 lbs in a week.  3) HTN - Blood pressure well controlled. Continue current regimen.  Follow up in 6 months  Amy D Clegg NP-C 9:19 AM   Patient seen and examined with Tonye BecketAmy Clegg, NP. We discussed all aspects of the encounter. I agree with the assessment and plan as stated above.  He remains stable. Volume  status looks good. I checked orthostatics personally in clinic and his BP just had mild drop. Will continue current regimen.   Bevelyn BucklesDaniel R Bensimhon,MD 1:29 PM

## 2014-01-06 NOTE — Patient Instructions (Addendum)
Follow up in 6 months  Take Metoprolol  XL 25 mg at bed time  Do the following things EVERYDAY: 1) Weigh yourself in the morning before breakfast. Write it down and keep it in a log. 2) Take your medicines as prescribed 3) Eat low salt foods-Limit salt (sodium) to 2000 mg per day.  4) Stay as active as you can everyday 5) Limit all fluids for the day to less than 2 liters

## 2014-02-23 ENCOUNTER — Telehealth (HOSPITAL_COMMUNITY): Payer: Self-pay | Admitting: Vascular Surgery

## 2014-02-23 NOTE — Telephone Encounter (Addendum)
Refill ATORVASTATIN 80 mg , Spironalactone 25 mg, Furosemide and Enalapril PT CALLED AGAIN TODAY ABOUT THESE MEDS please advise

## 2014-02-25 MED ORDER — ENALAPRIL MALEATE 5 MG PO TABS: 5.0000 mg | ORAL_TABLET | Freq: Every day | ORAL | Status: DC

## 2014-02-25 MED ORDER — SPIRONOLACTONE 25 MG PO TABS
25.0000 mg | ORAL_TABLET | Freq: Every day | ORAL | Status: DC
Start: 2014-02-25 — End: 2014-09-30

## 2014-02-25 MED ORDER — ATORVASTATIN CALCIUM 80 MG PO TABS: 80.0000 mg | ORAL_TABLET | Freq: Every day | ORAL | Status: DC

## 2014-02-25 MED ORDER — FUROSEMIDE 40 MG PO TABS: 40.0000 mg | ORAL_TABLET | Freq: Every day | ORAL | Status: DC

## 2014-02-25 NOTE — Telephone Encounter (Signed)
Prescriptions sent in, pt aware

## 2014-03-14 IMAGING — RF DG ESOPHAGUS
15 of 24 series · 15 of 24 positions shown · non-contrast
Comparison: None.

CLINICAL DATA: Reflux

ESOPHAGUS/BARIUM SWALLOW/TABLET STUDY
Fluoroscopy Time: 3 minutes and 3 seconds

[Series 1: run · 1 of 39 slices shown (1 of 15)]
[im 1/39]
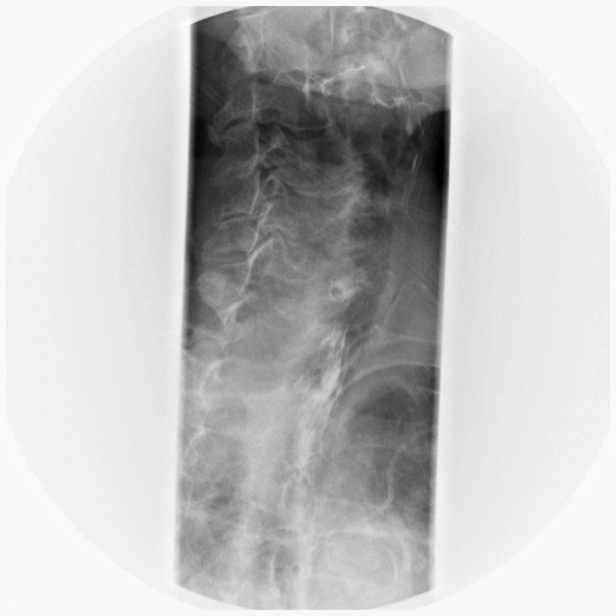

[Series 3: run · 1 of 1 slices shown (2 of 15)]
[im 1/1]
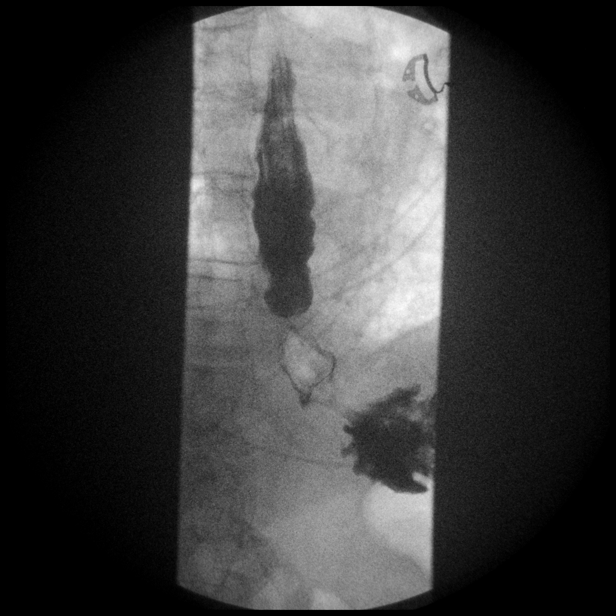

[Series 5: run · 1 of 1 slices shown (3 of 15)]
[im 1/1]
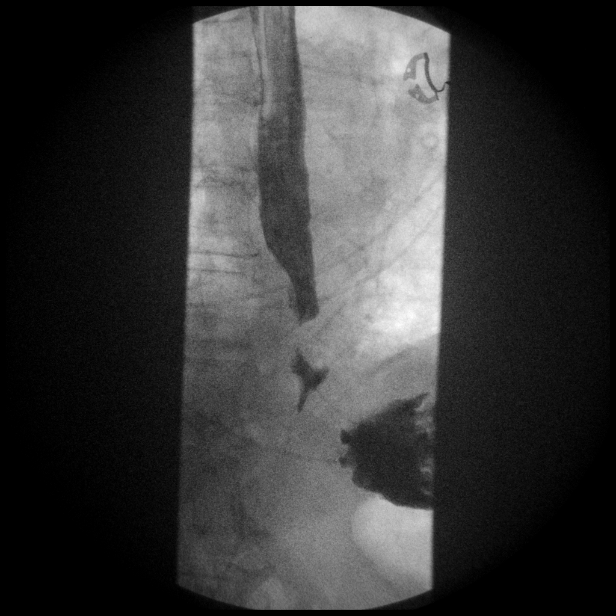

[Series 6: run · 1 of 1 slices shown (4 of 15)]
[im 1/1]
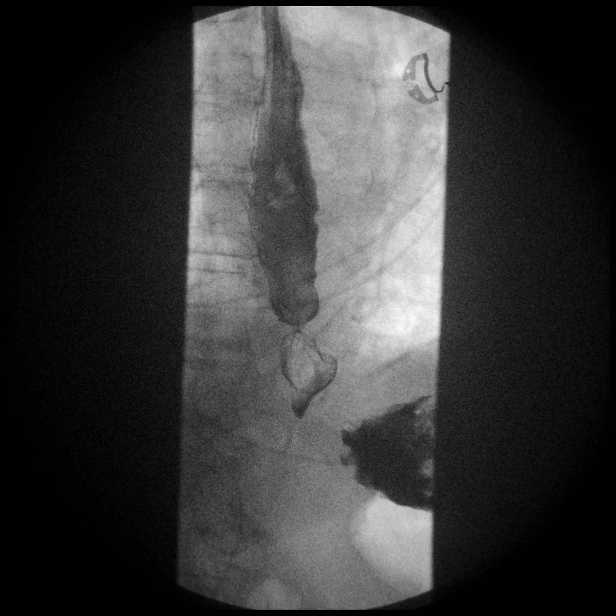

[Series 8: run · 1 of 1 slices shown (5 of 15)]
[im 1/1]
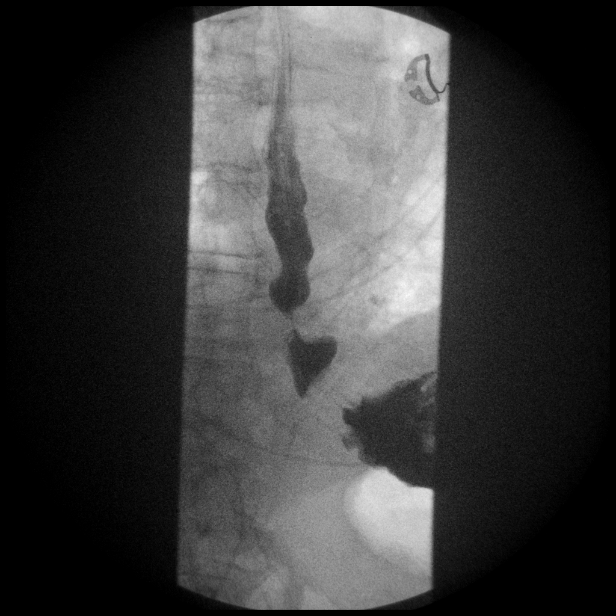

[Series 9: run · 1 of 1 slices shown (6 of 15)]
[im 1/1]
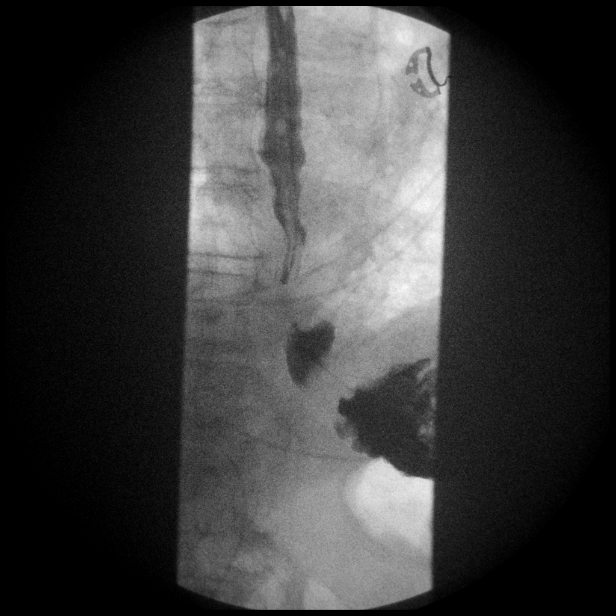

[Series 11: run · 1 of 1 slices shown (7 of 15)]
[im 1/1]
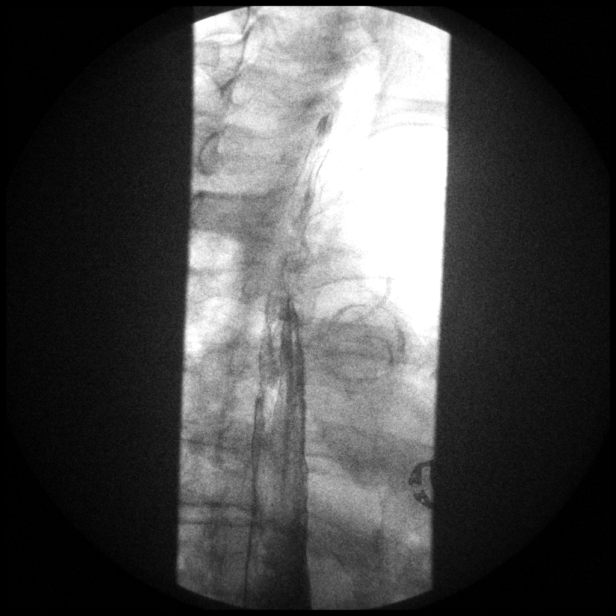

[Series 13: run · 1 of 1 slices shown (8 of 15)]
[im 1/1]
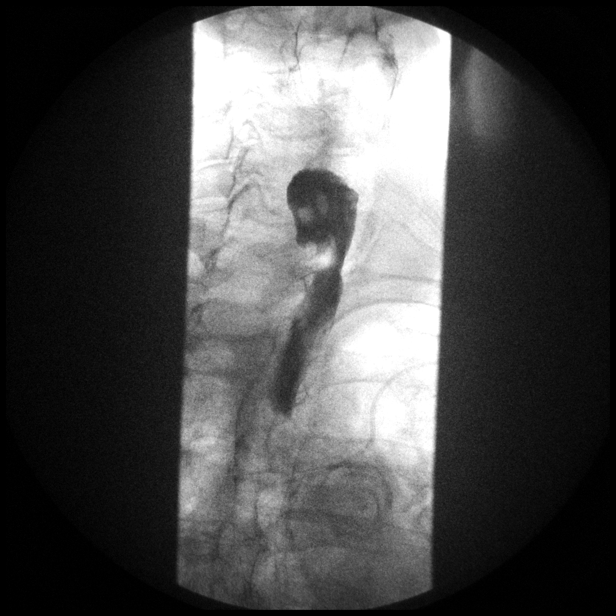

[Series 14: run · 1 of 1 slices shown (9 of 15)]
[im 1/1]
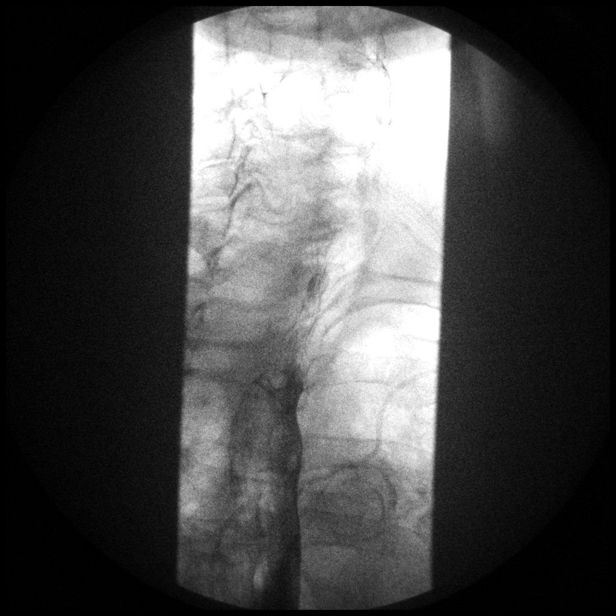

[Series 16: run · 1 of 1 slices shown (10 of 15)]
[im 1/1]
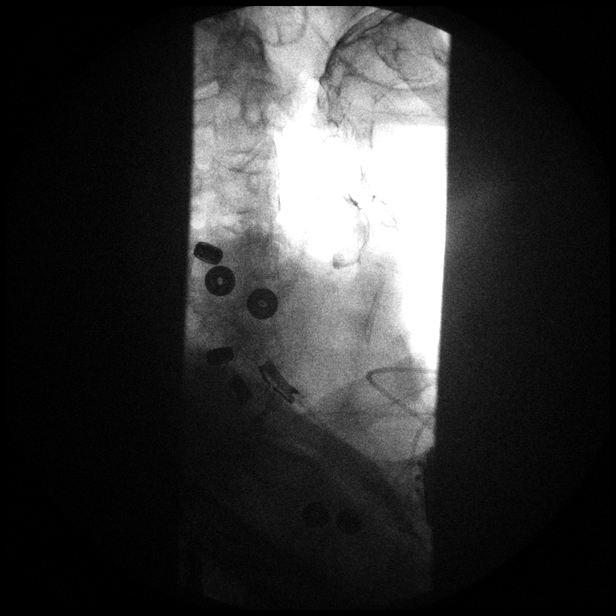

[Series 17: run · 1 of 1 slices shown (11 of 15)]
[im 1/1]
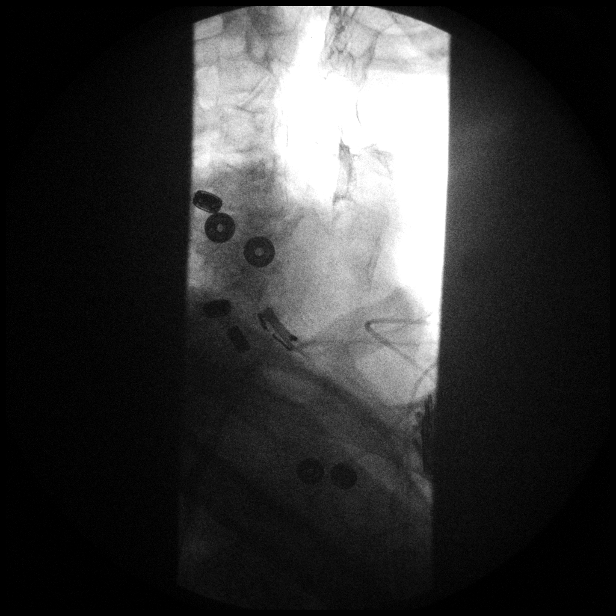

[Series 19: run · 1 of 1 slices shown (12 of 15)]
[im 1/1]
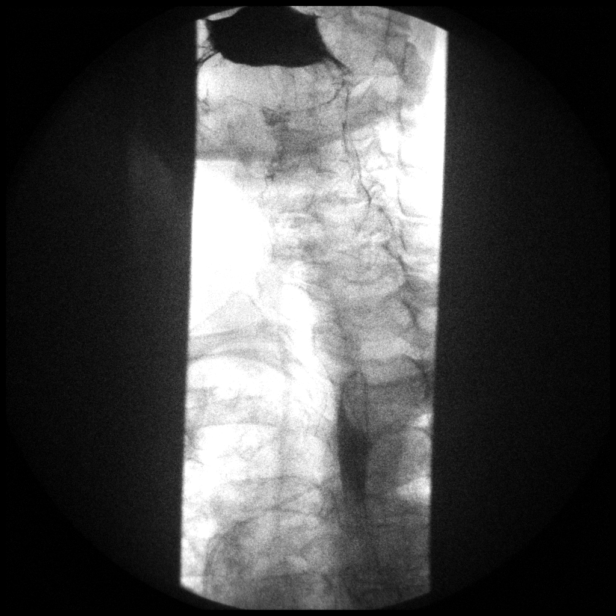

[Series 21: run · 1 of 1 slices shown (13 of 15)]
[im 1/1]
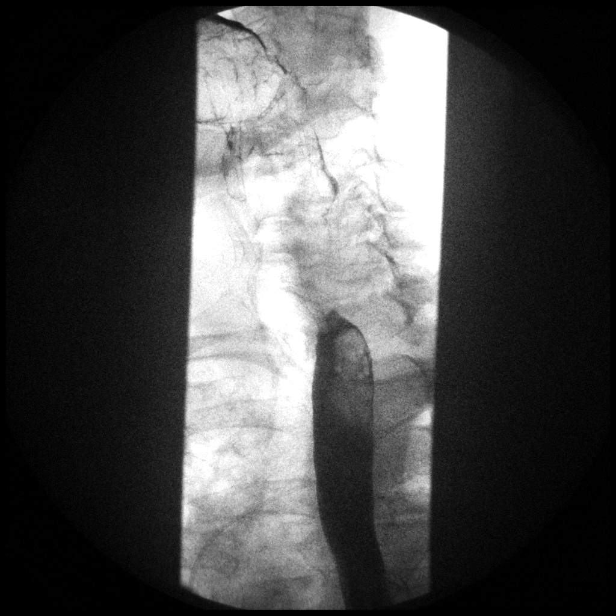

[Series 22: run · 1 of 1 slices shown (14 of 15)]
[im 1/1]
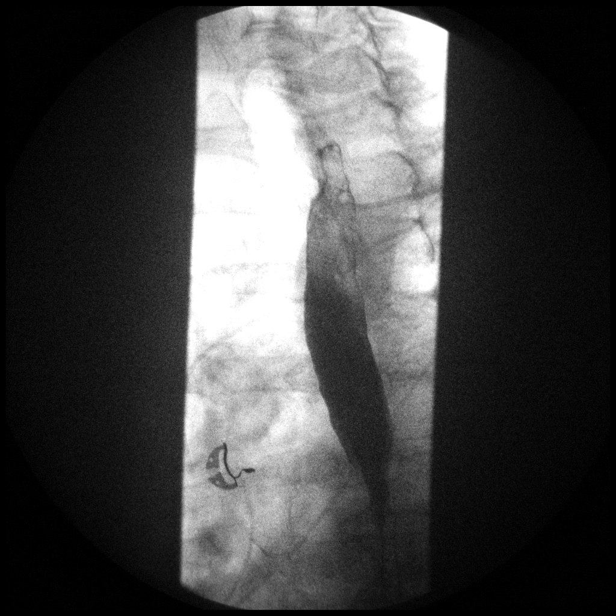

[Series 24: run · 1 of 1 slices shown (15 of 15)]
[im 1/1]
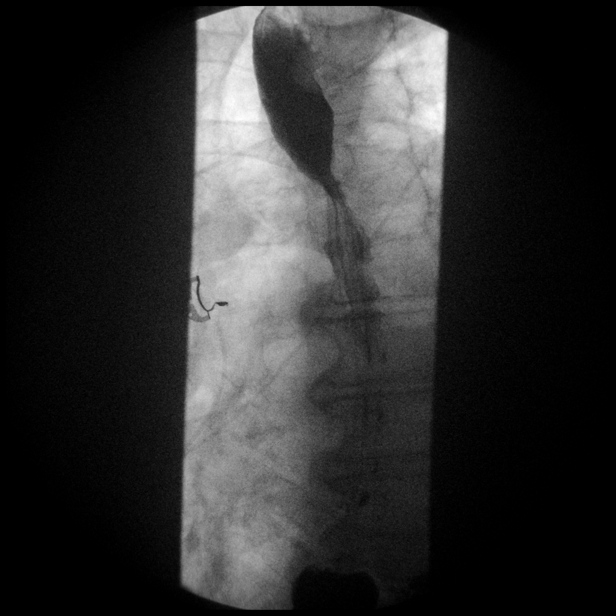

[15 of 24 positions shown; findings below may reference images not displayed]

FINDINGS: Single contrast imaging of the esophagus shows no
esophageal stricture.  No esophageal diverticulum.  No constricting
mass lesion.

There is disruption of primary peristalsis consistent with
nonspecific esophageal motility disorder.

13 mm barium tablet passes readily into the stomach when taken with
water.
IMPRESSION: No evidence for esophageal mass lesion or stricture.

## 2014-07-30 ENCOUNTER — Encounter (HOSPITAL_COMMUNITY): Payer: Self-pay | Admitting: Cardiology

## 2014-07-31 ENCOUNTER — Other Ambulatory Visit (HOSPITAL_COMMUNITY): Payer: Self-pay | Admitting: Cardiology

## 2014-07-31 MED ORDER — CLOPIDOGREL BISULFATE 75 MG PO TABS: 75.0000 mg | ORAL_TABLET | Freq: Every day | ORAL | Status: DC

## 2014-08-28 ENCOUNTER — Other Ambulatory Visit (HOSPITAL_COMMUNITY): Payer: Self-pay | Admitting: Adult Health

## 2014-09-30 ENCOUNTER — Other Ambulatory Visit (HOSPITAL_COMMUNITY): Payer: Self-pay

## 2014-09-30 MED ORDER — FUROSEMIDE 40 MG PO TABS: 40.0000 mg | ORAL_TABLET | Freq: Every day | ORAL | Status: DC

## 2014-09-30 MED ORDER — SPIRONOLACTONE 25 MG PO TABS: 25.0000 mg | ORAL_TABLET | Freq: Every day | ORAL | Status: DC

## 2014-09-30 MED ORDER — ENALAPRIL MALEATE 5 MG PO TABS: 5.0000 mg | ORAL_TABLET | Freq: Every day | ORAL | Status: DC

## 2014-10-02 ENCOUNTER — Other Ambulatory Visit (HOSPITAL_COMMUNITY): Payer: Self-pay

## 2014-10-20 ENCOUNTER — Encounter (HOSPITAL_COMMUNITY): Payer: Self-pay

## 2014-10-20 ENCOUNTER — Ambulatory Visit (HOSPITAL_COMMUNITY)
Admission: RE | Admit: 2014-10-20 | Discharge: 2014-10-20 | Disposition: A | Discharge status: Home / Self Care | Payer: Medicare Other | Source: Ambulatory Visit | Attending: Cardiology | Admitting: Cardiology

## 2014-10-20 VITALS — BP 118/62 | HR 70 | Wt 185.2 lb

## 2014-10-20 DIAGNOSIS — I5022 Chronic systolic (congestive) heart failure: Secondary | ICD-10-CM | POA: Insufficient documentation

## 2014-10-20 DIAGNOSIS — I2510 Atherosclerotic heart disease of native coronary artery without angina pectoris: Secondary | ICD-10-CM | POA: Insufficient documentation

## 2014-10-20 DIAGNOSIS — J8410 Pulmonary fibrosis, unspecified: Secondary | ICD-10-CM | POA: Insufficient documentation

## 2014-10-20 DIAGNOSIS — I255 Ischemic cardiomyopathy: Secondary | ICD-10-CM | POA: Diagnosis not present

## 2014-10-20 DIAGNOSIS — J849 Interstitial pulmonary disease, unspecified: Secondary | ICD-10-CM

## 2014-10-20 DIAGNOSIS — Z79899 Other long term (current) drug therapy: Secondary | ICD-10-CM | POA: Insufficient documentation

## 2014-10-20 DIAGNOSIS — Z7902 Long term (current) use of antithrombotics/antiplatelets: Secondary | ICD-10-CM | POA: Diagnosis not present

## 2014-10-20 LAB — BRAIN NATRIURETIC PEPTIDE: B NATRIURETIC PEPTIDE 5: 131.7 pg/mL — AB (ref 0.0–100.0)

## 2014-10-20 LAB — BASIC METABOLIC PANEL
Anion gap: 7 (ref 5–15)
BUN: 14 mg/dL (ref 6–23)
CALCIUM: 9.4 mg/dL (ref 8.4–10.5)
CO2: 26 mmol/L (ref 19–32)
Chloride: 105 mmol/L (ref 96–112)
Creatinine, Ser: 1.02 mg/dL (ref 0.50–1.35)
GFR, EST AFRICAN AMERICAN: 76 mL/min — AB (ref >90)
GFR, EST NON AFRICAN AMERICAN: 65 mL/min — AB (ref >90)
GLUCOSE: 102 mg/dL — AB (ref 70–99)
POTASSIUM: 4.3 mmol/L (ref 3.5–5.1)
SODIUM: 138 mmol/L (ref 135–145)

## 2014-10-20 MED ORDER — PRAVASTATIN SODIUM 40 MG PO TABS: 40.0000 mg | ORAL_TABLET | Freq: Every evening | ORAL | Status: DC

## 2014-10-20 NOTE — Patient Instructions (Signed)
Start Pravastatin 40 mg daily  Labs today  Fasting Labs in 2 months (liver, lipid panel)  You have been referred back to Dr Marchelle Gearingamaswamy in pulmonary  We will contact you in 6 months to schedule your next appointment.

## 2014-10-20 NOTE — Progress Notes (Signed)
Patient ID: Christopher [name], male   DOB: Mar 29, 1928, 81 y.o.   MRN: [id number]  Weight Range 174 pounds  Baseline proBNP   PCP: Dr Christopher [name]  HPI: [name] is an 81 yo male with a PMH of NSTEMI- 2 vessel CAD, CAD; s/p PTCA/DES x1 mid LAD, Chronic Systolic Heart Failure EF 25%,  Ischemic cardiomyopathy, Scleroderma, CKD, severe deconditioning, and  H/o tobacco abuse, quit 20 years ago.   [facility] 12/[facility]/12 with complaints of chest pressure and dyspnea that occurred with exertion and rest. [facility] was [facility] for further work up of NSTEMI and new onset congestive heart failure. During his hospital stay ECHO showed EF 25%. He had a PCI of the LAD with Promus Premier DES on 12/25/12. He required 6 L O2 with walking in the hosptail and had a pulmonary consult. A CT of chest was ordered showing UIP pattern suggestive of pulmonary fibrosis with acute pneumonitis therefore oral steroids were given. He was also diagnosed with scleroderma (2014). He was diuresed and started on a BB, ACE-I and Christopher [name]. Discharge weight 174 pounds.   Last echo in 11/14 showed EF 45-50%.    He is stable clinically.  Does ok walking on flat ground.  Uses a rollator for balance.  He can walk up a flight of steps.  No chest pain.  He gets "dizzy" with standing frequently, no falls. He has a history of orthostatic hypotension.   SH: Nephew lives with him.   ROS: All systems negative except as listed in HPI, PMH and Problem List.  Past Medical History  Diagnosis Date  . Hypertension   . BPH (benign prostatic hyperplasia)   . Hemorrhoids     Current Outpatient Prescriptions  Medication Sig Dispense Refill  . clopidogrel (PLAVIX) 75 MG tablet Take 1 tablet (75 mg total) by mouth daily with breakfast. 30 tablet 12  . enalapril (VASOTEC) 5 MG tablet Take 1 tablet (5 mg total) by mouth at bedtime. 30 tablet 6  . furosemide (LASIX) 40 MG tablet Take 1 tablet (40 mg total) by mouth daily. 30 tablet 6  .  metoprolol succinate (TOPROL-XL) 25 MG 24 hr tablet TAKE ONE TABLET BY MOUTH AT BEDTIME. 30 tablet 3  . spironolactone (ALDACTONE) 25 MG tablet Take 1 tablet (25 mg total) by mouth daily. 30 tablet 6  . pravastatin (PRAVACHOL) 40 MG tablet Take 1 tablet (40 mg total) by mouth every evening. 30 tablet 3   No current facility-administered medications for this encounter.    Filed Vitals:   10/20/14 0857  BP: 118/62  Pulse: 70  Weight: 185 lb 4 oz (84.029 kg)  SpO2: 96%    PHYSICAL EXAM: General: Elderly appearing. No resp difficulty; nephew present HEENT: normal Neck: supple. JVP 5-6. Carotids 2+ bilaterally; no bruits. No lymphadenopathy or thryomegaly appreciated. Cor: PMI normal. Regular rate & rhythm. No rubs, gallops or murmurs. Lungs: Dry crackles 1/2 up lung fields bilaterally.  Abdomen: soft, nontender, nondistended. No hepatosplenomegaly. No bruits or masses. Good bowel sounds. Extremities: no cyanosis, clubbing, rash, edema Neuro: alert & orientedx3, cranial nerves grossly intact. Moves all 4 extremities w/o difficulty. Affect pleasant.   ASSESSMENT & PLAN:  1) CAD: No chest pain.  He will continue Plavix and Toprol XL.  He is not on a statin, had myalgias with atorvastatin but has not tried any other statins.  I will start him on pravastatin 40 mg daily with lipids/LFTs in 2 months.  If he  develops myalgias, he should stop it.  2) Chronic systolic HF: Ischemic cardiomyopathy, EF improved 45-50%  (07/10/13 echo).  NYHA II symptoms though not very active. Volume status stable. - Continue lasix 40 mg daily and 25 mg spironolactone daily.  - Continue Toprol XL 25 mg daily and enalapril 5 mg qhs.  Will not titrate up due to issues with orthostasis.  - BMET/BNP today.  3) Pulmonary Fibrosis: Stable symptoms.  I do think he should have pulmonary followup, will arrange (has not seen pulmonary in a long time).   Christopher McLean,MD 10/20/2014

## 2014-11-25 ENCOUNTER — Ambulatory Visit (INDEPENDENT_AMBULATORY_CARE_PROVIDER_SITE_OTHER): Payer: Medicare Other | Admitting: Adult Health

## 2014-11-25 ENCOUNTER — Ambulatory Visit: Payer: Medicare Other | Admitting: Internal Medicine

## 2014-11-25 ENCOUNTER — Ambulatory Visit (INDEPENDENT_AMBULATORY_CARE_PROVIDER_SITE_OTHER)
Admission: RE | Admit: 2014-11-25 | Discharge: 2014-11-25 | Disposition: A | Discharge status: Home / Self Care | Payer: Medicare Other | Source: Ambulatory Visit | Attending: Adult Health | Admitting: Adult Health

## 2014-11-25 ENCOUNTER — Encounter: Payer: Self-pay | Admitting: Adult Health

## 2014-11-25 VITALS — BP 118/76 | HR 67 | Temp 97.7°F | Ht 69.0 in | Wt 189.4 lb

## 2014-11-25 DIAGNOSIS — J849 Interstitial pulmonary disease, unspecified: Secondary | ICD-10-CM

## 2014-11-25 DIAGNOSIS — J9621 Acute and chronic respiratory failure with hypoxia: Secondary | ICD-10-CM

## 2014-11-25 NOTE — Assessment & Plan Note (Signed)
ILD with known underlying scleroderma Currently on no treatment- patient has declined Overall patient appears to be doing well despite his multiple comorbidities O2 saturations are adequate on room air. We'll have patient return for a follow-up CT and PFT Chest x-ray today without any significant change noted

## 2014-11-25 NOTE — Progress Notes (Signed)
Subjective:    Patient ID: Christopher [name], male    DOB: [birth year], 81 y.o.   MRN: [id number]  HPI  #CAD -  NSTEMI - 2 vessel CAD   -  s/p PTCA/DES x1 mid LAD   #  systolic heart failure - new diagnosis May 2014 - ICM with EF 25%  # Chronic renal failure   #hx of  UTI   # Severe deconditioning following admision fo CHF and MI May 2014  #H/o tobacco abuse, quit 20 years ago   #69m RLL - MAy 2014 CT chest  #UIP Possible on CT Chest May 2014 during CHF/NSTEMI admission   -  ESR highly elevated at 103 and highly elevated scleroderma SCL-70 antibody at 230 but negative ANA rheumatoid factor p-ANCA and atypical anca though C. Anka was positive. 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 I'm not sure if he had amiodarone therapy but he denies any mold exposure.    - He's not noted have collagen basilar disease and denies any Raynaud's phenomena or dry skin or dry eyes.   - Other relevant studies done while in the hospital include  5/12 Esophagram >> disruption of primary peristalsis consistent with nonspecific esophageal motility disorder  5/12 Speech therapy swallow evaluation >> normal swallow function 5/14 PFT >> FEV1 1.76 (78%), FEV1% 84, TLC 6.26 (57%), DLCO 39%, no BD response  - In terms of therapy  on 12/31/2012 patient was given Solu-Medrol and on 01/02/2013 at the time of discharge he was given oral prednisone? 60 mg daily to continue. This was on his medication list may 22nd 2014 with cardiology but at this point in time today 01/15/2013 I do not see this on his medication list and I do not know why      OV 04/17/2013 Followup 833mRLL lung nodule and Followup for possible UIP on CT scan of the chest with autoimmune antibody positive for scleroderma/70  He has not had fu CT chest  Saw Dr. ShTobie Lords7/04/2013 and thought might have  limited scleroderma. No specific intervention to her rheumatologist and point and was discharged from followup. Monitoring for pulmonary hypertension advised.   Pulmonary Function test 04/09/2013 shows FVC 3.2 L/95%. FEV1 2.8 L has 117%. Pressure 86. Total lung capacity 0.8 L/74%. DLCO 20/70%. In summary mild reduction in total lung capacity and diffusion. This appears unchanged from baseline. In terms of symptoms, he is feeling better. No dyspnea. He is doing activities of daily living. Is walking outdoors and doing things and does not have any shortness of breath. He says he is off his oxygen based advised by cardiology. There is no edema, chest pain. He's not interested in oxygen therapy. We discussed immunomodulators therapy for autoimmune lung disease and he's not interested  11/25/2014 Follow up : ILD , scleroderma  Patient returns for a follow-up of his underlying interstitial lung disease. He has been found to have scleroderma previously seen by rheumatology He has declined treatment. Has O2 at home but says he is not wearing, does not feel he needs it.  No cough. He denies any chest pain, orthopnea, PND, or increased leg swelling. Doing well. Only gets dyspnea when he over exerts.  Uses walker. Feels he is active for his age.  Works around yard, gets up wo[employer] Autom[employer] Drives and very independent.  We discussed repeating his CT  chest and PFTs He would like to do this on his return visit as he lives quite a distance from our office.    Review of Systems  Constitutional: Negative for fever and unexpected weight change.  HENT: Negative for ear pain, nosebleeds, congestion, sore throat, rhinorrhea, sneezing, trouble swallowing, dental problem, postnasal drip and sinus pressure.   Eyes: Negative for redness and itching.  Respiratory: Positive for shortness of breath. Negative for cough, chest tightness and wheezing.   Cardiovascular: Negative for palpitations and leg swelling.  Gastrointestinal:  Negative for nausea and vomiting.  Genitourinary: Negative for dysuria.  Musculoskeletal: Negative for joint swelling.  Skin: Negative for rash.  Neurological: Negative for headaches.  Hematological: Does not bruise/bleed easily.  Psychiatric/Behavioral: Negative for dysphoric mood. The patient is not nervous/anxious.       Objective:   Physical Exam  Nursing note and vitals reviewed. Constitutional: He is oriented to person, place, and time. He appears well-developed and well-nourished. No distress.  Pleasant HENT:  Head: Normocephalic and atraumatic.  Right Ear: External ear normal.  Left Ear: External ear normal.  Mouth/Throat: Oropharynx is clear and moist. No oropharyngeal exudate. Poor dentition  Edentulous  Eyes: Conjunctivae and EOM are normal. Pupils are equal, round, and reactive to light. Right eye exhibits no discharge. Left eye exhibits no discharge. No scleral icterus.  Neck: Normal range of motion. Neck supple. No JVD present. No tracheal deviation present. No thyromegaly present.  Cardiovascular: Normal rate, regular rhythm and intact distal pulses.  Exam reveals no gallop and no friction rub.   No murmur heard. Pulmonary/Chest: Effort normal. No respiratory distress. He has no wheezes. He has rales. He exhibits no tenderness.  Abdominal: Soft. Bowel sounds are normal. He exhibits no distension and no mass. There is no tenderness. There is no rebound and no guarding.  Musculoskeletal: Normal range of motion. He exhibits no edema and no tenderness.  Scoliotic and walks with a walker  Lymphadenopathy:    He has no cervical adenopathy.  Neurological: He is alert and oriented to person, place, and time. He has normal reflexes. No cranial nerve deficit. Coordination normal.  Skin: Skin is warm and dry. No rash noted. He is not diaphoretic. No erythema. No pallor.  Psychiatric: He has a normal mood and affect. His behavior is normal. Judgment and thought content normal.      CXR 11/25/2014 <The lungs are well-expanded. The interstitial markings are coarse especially peripherally and inferiorly. There is no alveolar infiltrate. The cardiac silhouette is top-normal in size. The pulmonary vascularity is normal. There is no pleural effusion. There is tortuosity of the descending thoracic aorta. There is mild degenerative disc disease of the thoracic spine. Reviewed independently      Assessment & Plan:

## 2014-11-25 NOTE — Assessment & Plan Note (Signed)
Chronic hypoxic respiratory failure Patient's O2 saturations are adequate on room air at rest. He was advised that oxygen oxygen is necessary. If he desaturates. He says he does not need this that he does not feel short of breath.

## 2014-11-25 NOTE — Patient Instructions (Signed)
Continue on current regimen We are setting, you up for a CT chest and pulmonary function test on return in 3 months with Dr. Marchelle Gearingamaswamy Follow-up with our office as planned and as needed

## 2014-11-26 NOTE — Addendum Note (Signed)
Addended by: Boone MasterJONES, Gleen Ripberger E on: 11/26/2014 10:19 AM   Modules accepted: Orders

## 2015-01-20 ENCOUNTER — Other Ambulatory Visit (HOSPITAL_COMMUNITY): Payer: Self-pay | Admitting: Adult Health

## 2015-03-01 ENCOUNTER — Other Ambulatory Visit (HOSPITAL_COMMUNITY): Payer: Self-pay | Admitting: Cardiology

## 2015-03-02 ENCOUNTER — Ambulatory Visit (INDEPENDENT_AMBULATORY_CARE_PROVIDER_SITE_OTHER)
Admission: RE | Admit: 2015-03-02 | Discharge: 2015-03-02 | Disposition: A | Discharge status: Home / Self Care | Payer: Medicare Other | Source: Ambulatory Visit | Attending: Adult Health | Admitting: Adult Health

## 2015-03-02 DIAGNOSIS — J849 Interstitial pulmonary disease, unspecified: Secondary | ICD-10-CM | POA: Diagnosis not present

## 2015-03-08 NOTE — Progress Notes (Signed)
Quick Note:  LVM for pt to return call ______ 

## 2015-03-09 NOTE — Progress Notes (Signed)
Quick Note:  LVM for pt to return call ______ 

## 2015-03-10 ENCOUNTER — Telehealth: Payer: Self-pay | Admitting: Internal Medicine

## 2015-03-10 NOTE — Telephone Encounter (Signed)
Results have been explained to patient, pt expressed understanding. Patient was supposed to be scheduled for OV with MR (3 months -- 02/2015) -- this was never scheduled from Recall.  Patient needs PFT prior to appt as well.  Please advise where we can get this patient in - MR first available is in September which is 5 months from last OV.

## 2015-03-10 NOTE — Telephone Encounter (Signed)
Notes Recorded by Julio Sicksammy S Parrett, NP on 03/04/2015 at 4:47 PM CT chest shows no growth in nodules, c/w benign etiology  ILD stable  Will discuss in more detail on return visti with Dr. Marchelle Gearingamaswamy  Has known CAD followed by cards ----  lmomtcb x1

## 2015-03-10 NOTE — Telephone Encounter (Signed)
Pt returned call and can be reached @ [phone].[name]

## 2015-03-11 NOTE — Telephone Encounter (Signed)
lmtcb for pt. Ok to book pt with MR on 7.25.16. If pt can make appt, will need to see if PFT can be done at hospital before appt.

## 2015-03-12 NOTE — Telephone Encounter (Signed)
lmtcb x2 

## 2015-03-15 NOTE — Telephone Encounter (Signed)
lmtcb

## 2015-03-16 NOTE — Telephone Encounter (Signed)
Robynn Pane please advise if pt can be worked in a different date. Thanks!

## 2015-03-16 NOTE — Telephone Encounter (Signed)
There is an opening on 7.28.16 at 0930 with MR. Thanks.

## 2015-03-16 NOTE — Telephone Encounter (Signed)
Pt scheduled for OV with Dr Marchelle Gearing 03/18/15 Refused to scheduled PFT - states that he was advised last OV that he would not need another for about 6 months to 1 year from last OV.  I explained to the patient that per last OV notes 11/2014, he was needing to follow up with MR in 3 months with PFT. Pt still refused to schedule. Kept stating also that he did not understand why he needed another appt because he was just seen. I advised him again of the rec's per MR and TP, pt refused to schedule anything but OV and left conversation stating that he will let us know if he can make it to his appt on Thursday 7/28 @ 9:30.  Will send to MR and Robynn Pane as Lorain Childes.

## 2015-03-18 ENCOUNTER — Ambulatory Visit: Payer: Medicare Other | Admitting: Internal Medicine

## 2015-03-28 NOTE — Telephone Encounter (Signed)
Ok it is his choice not to follow. Will close message

## 2015-04-01 ENCOUNTER — Telehealth: Payer: Self-pay | Admitting: Internal Medicine

## 2015-04-01 NOTE — Telephone Encounter (Signed)
lmomtcb x1 for pt 

## 2015-04-02 NOTE — Telephone Encounter (Signed)
Called and spoke to pt. Pt stated he was returning a call. Nothing in Epic indicates a recent call to pt that still needs addressed. Pt verbalized understanding and denied any further questions or concerns at this time.

## 2015-04-29 ENCOUNTER — Other Ambulatory Visit (HOSPITAL_COMMUNITY): Payer: Self-pay | Admitting: Internal Medicine

## 2015-05-12 ENCOUNTER — Ambulatory Visit (INDEPENDENT_AMBULATORY_CARE_PROVIDER_SITE_OTHER): Payer: Medicare Other | Admitting: Internal Medicine

## 2015-05-12 ENCOUNTER — Encounter: Payer: Self-pay | Admitting: Internal Medicine

## 2015-05-12 VITALS — BP 120/64 | HR 78 | Ht 69.0 in | Wt 197.8 lb

## 2015-05-12 DIAGNOSIS — J849 Interstitial pulmonary disease, unspecified: Secondary | ICD-10-CM | POA: Diagnosis not present

## 2015-05-12 NOTE — Patient Instructions (Addendum)
ICD-9-CM ICD-10-CM   1. 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 Undifferentiated connective tissue disease interstitial lung disease 515 J84.9      ILD is stable You do not need o2 at rest or with 100 yards of exertion Respect desire to refuse flu shot and cellcept therapy Respect desire to refuse echo of heart   Followup  1 year or sooner if needed

## 2015-05-12 NOTE — Progress Notes (Signed)
Subjective:    Patient ID: Christopher [name], male    DOB: 10/26/1927, 81 y.o.   MRN: [id number]  HPI #CAD -  NSTEMI - 2 vessel CAD   -  s/p PTCA/DES x1 mid LAD   #  systolic heart failure - new diagnosis May 2014 - ICM with EF 25%  # Chronic renal failure   #hx of  UTI   # Severe deconditioning following admision fo CHF and MI May 2014  #H/o tobacco abuse, quit 20 years ago   #57m RLL - MAy 2014 CT chest  #UIP Possible on CT Chest May 2014 during CHF/NSTEMI admission   -  ESR highly elevated at 103 and highly elevated scleroderma SCL-70 antibody at 230 but negative ANA rheumatoid factor p-ANCA and atypical anca though C. Anka was positive. 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 I'm not sure if he had amiodarone therapy but he denies any mold exposure.    - He's not noted have collagen basilar disease and denies any Raynaud's phenomena or dry skin or dry eyes.   - Other relevant studies done while in the hospital include  5/12 Esophagram >> disruption of primary peristalsis consistent with nonspecific esophageal motility disorder  5/12 Speech therapy swallow evaluation >> normal swallow function 5/14 PFT >> FEV1 1.76 (78%), FEV1% 84, TLC 6.26 (57%), DLCO 39%, no BD response  - In terms of therapy  on 12/31/2012 patient was given Solu-Medrol and on 01/02/2013 at the time of discharge he was given oral prednisone? 60 mg daily to continue. This was on his medication list may 22nd 2014 with cardiology but at this point in time today 01/15/2013 I do not see this on his medication list and I do not know why      OV 04/17/2013 Followup 866mRLL lung nodule and Followup for possible UIP on CT scan of the chest with autoimmune antibody positive for scleroderma/70  He has not had fu CT chest  Saw Dr. ShTobie Lords7/04/2013 and thought might have  limited scleroderma. No specific intervention to her rheumatologist and point and was discharged from followup. Monitoring for pulmonary hypertension advised.   Pulmonary Function test 04/09/2013 shows FVC 3.2 L/95%. FEV1 2.8 L has 117%. Pressure 86. Total lung capacity 0.8 L/74%. DLCO 20/70%. In summary mild reduction in total lung capacity and diffusion. This appears unchanged from baseline. In terms of symptoms, he is feeling better. No dyspnea. He is doing activities of daily living. Is walking outdoors and doing things and does not have any shortness of breath. He says he is off his oxygen based advised by cardiology. There is no edema, chest pain. He's not interested in oxygen therapy. We discussed immunomodulators therapy for autoimmune lung disease and he's not interested  11/25/2014 Follow up : ILD , scleroderma  Patient returns for a follow-up of his underlying interstitial lung disease. He has been found to have scleroderma previously seen by rheumatology He has declined treatment. Has O2 at home but says he is not wearing, does not feel he needs it.  No cough. He denies any chest pain, orthopnea, PND, or increased leg swelling. Doing well. Only gets dyspnea when he over exerts.  Uses walker. Feels he is active for his age.  Works around yard, gets up wo[employer] Drives and very independent.  We discussed repeating his  CT chest and PFTs He would like to do this on his return visit as he lives quite a distance from our office.   OV 05/12/2015  Chief Complaint  Patient presents with  . Follow-up    Pt had CT chest in 02/2015 but did not have PFT. Pt states his breathing is unchanged. Pt c/o DOE - at baseline, dizziness when standing, prod cough with yellow mucus. Pt denies CP/tightness.   FU ILD/UIP due to scleroderma  SAw NP April 2016 afer 2 years. Advied to have CT and PFT; he only had CT chest. Personally viusalisex mage and stable ILD since 2015/2015. IS of UIP patternb. Subjectively  he feels stable. [place] walker. Self cares. Sedendatary life with ADLs; denies dyspnea except if he "hurries":Marland Kitchen No cough. HAs orthostatic and exertional dizziness - he does not think this is related to ILD. Has O2 at home but does not feel need to use it. POVerall stable health.   REfused flu shot. Says he does not like medicagtions. Prefers natural Rx. Last ECHO 6160 - chronic systolic CHF . Refuses another echo for pulm htn monitoring  Walking desat 185 feet x 3 laps - stopped at 2 laps. No dysponea. No desats. STopped due to left leg pain    has a past medical history of Hypertension; BPH (benign prostatic hyperplasia); and Hemorrhoids.   reports that he quit smoking about 56 years ago. His smoking use included Cigarettes. He has a 20 pack-year smoking history. He does not have any smokeless tobacco history on file.  Past Surgical History  Procedure Laterality Date  . Carotid stent    . Left heart catheterization with coronary angiogram N/A 12/20/2012    Procedure: LEFT HEART CATHETERIZATION WITH CORONARY ANGIOGRAM;  Surgeon: Larey Dresser, MD;  Location: [facility] CATH LAB;  Service: Cardiovascular;  Laterality: N/A;  . Percutaneous coronary stent intervention (pci-s) N/A 12/25/2012    Procedure: PERCUTANEOUS CORONARY STENT INTERVENTION (PCI-S);  Surgeon: Burnell Blanks, MD;  Location: [facility] CATH LAB;  Service: Cardiovascular;  Laterality: N/A;    No Known Allergies  Immunization History  Administered Date(s) Administered  . Pneumococcal Polysaccharide-23 08/21/2009    Family History  Problem Relation Age of Onset  . Other      no premature CAD.  Marland Kitchen Congestive Heart Failure Sister      Current outpatient prescriptions:  .  Cholecalciferol (VITAMIN D-3) 1000 UNITS CAPS, Take 1 capsule by mouth daily., Disp: , Rfl:  .  clopidogrel (PLAVIX) 75 MG tablet, Take 1 tablet (75 mg total) by mouth daily with breakfast., Disp: 30 tablet, Rfl: 12 .  enalapril (VASOTEC)  5 MG tablet, TAKE 1 TABLET BY MOUTH AT BEDTIME., Disp: 30 tablet, Rfl: 0 .  furosemide (LASIX) 40 MG tablet, TAKE (1) TABLET BY MOUTH ONCE DAILY., Disp: 30 tablet, Rfl: 0 .  meloxicam (MOBIC) 15 MG tablet, Take 15 mg by mouth daily., Disp: , Rfl:  .  metoprolol succinate (TOPROL-XL) 25 MG 24 hr tablet, TAKE (1) TABLET DAILY AT BEDTIME., Disp: 30 tablet, Rfl: 3 .  spironolactone (ALDACTONE) 25 MG tablet, TAKE (1) TABLET BY MOUTH ONCE DAILY., Disp: 30 tablet, Rfl: 0    Review of Systems  Constitutional: Negative for fever and unexpected weight change.  HENT: Negative for congestion, dental problem, ear pain, nosebleeds, postnasal drip, rhinorrhea, sinus pressure, sneezing, sore throat and trouble swallowing.   Eyes: Negative for redness and itching.  Respiratory: Positive for cough and shortness of breath. Negative  for chest tightness and wheezing.   Cardiovascular: Negative for palpitations and leg swelling.  Gastrointestinal: Negative for nausea and vomiting.  Genitourinary: Negative for dysuria.  Musculoskeletal: Negative for joint swelling.  Skin: Negative for rash.  Neurological: Positive for dizziness. Negative for headaches.  Hematological: Does not bruise/bleed easily.  Psychiatric/Behavioral: Negative for dysphoric mood. The patient is not nervous/anxious.        Objective:   Physical Exam  Constitutional: He is oriented to person, place, and time. He appears well-developed and well-nourished. No distress.  deconditoned looking male Somewhat disheveled  HENT:  Head: Normocephalic and atraumatic.  Right Ear: External ear normal.  Left Ear: External ear normal.  Mouth/Throat: Oropharynx is clear and moist. No oropharyngeal exudate.  Eyes: Conjunctivae and EOM are normal. Pupils are equal, round, and reactive to light. Right eye exhibits no discharge. Left eye exhibits no discharge. No scleral icterus.  Neck: Normal range of motion. Neck supple. No JVD present. No tracheal  deviation present. No thyromegaly present.  Cardiovascular: Normal rate, regular rhythm and intact distal pulses.  Exam reveals no gallop and no friction rub.   No murmur heard. Pulmonary/Chest: Effort normal. No respiratory distress. He has no wheezes. He has rales. He exhibits no tenderness.  Abdominal: Soft. Bowel sounds are normal. He exhibits no distension and no mass. There is no tenderness. There is no rebound and no guarding.  Musculoskeletal: Normal range of motion. He exhibits no edema or tenderness.  Antalgic gait with a walker  Lymphadenopathy:    He has no cervical adenopathy.  Neurological: He is alert and oriented to person, place, and time. He has normal reflexes. No cranial nerve deficit. Coordination normal.  Skin: Skin is warm and dry. No rash noted. He is not diaphoretic. No erythema. No pallor.  Psychiatric: He has a normal mood and affect. His behavior is normal. Judgment and thought content normal.  Nursing note and vitals reviewed.   Filed Vitals:   05/12/15 0917  BP: 120/64  Pulse: 78  Height: _0  (1.753 m)  Weight: 197 lb 12.8 oz (89.721 kg)  SpO2: 98%        Assessment & Plan:     ICD-9-CM ICD-10-CM   1. 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 Undifferentiated connective tissue disease interstitial lung disease 515 J84.9     ILD is stable Dizzinees not related to ILD You do not need o2 at rest or with 100 yards of exertion Respect desire to refuse flu shot and cellcept therapy Respect desire to refuse echo of heart   Followup  1 year or sooner if needed   (> 50% of this 15 min visit spent in face to face counseling or/and coordination of care)   Dr. Brand Males, M.D., [facility].C.P Pulmonary and Critical Care Medicine Staff Physician [facility] Pager: [phone], If no answer or between  15:00h - 7:00h: call [phone]  05/12/2015 9:49  AM

## 2015-05-31 ENCOUNTER — Other Ambulatory Visit: Payer: Self-pay | Admitting: Internal Medicine

## 2015-05-31 ENCOUNTER — Other Ambulatory Visit (HOSPITAL_COMMUNITY): Payer: Self-pay | Admitting: Adult Health

## 2015-06-26 ENCOUNTER — Other Ambulatory Visit: Payer: Self-pay | Admitting: Internal Medicine

## 2015-07-29 ENCOUNTER — Other Ambulatory Visit (HOSPITAL_COMMUNITY): Payer: Self-pay | Admitting: Adult Health

## 2015-07-29 ENCOUNTER — Other Ambulatory Visit: Payer: Self-pay | Admitting: Internal Medicine

## 2015-08-31 ENCOUNTER — Other Ambulatory Visit (HOSPITAL_COMMUNITY): Payer: Self-pay | Admitting: Adult Health

## 2015-11-26 ENCOUNTER — Other Ambulatory Visit: Payer: Self-pay | Admitting: Internal Medicine

## 2015-11-26 ENCOUNTER — Other Ambulatory Visit (HOSPITAL_COMMUNITY): Payer: Self-pay | Admitting: Adult Health

## 2016-02-24 ENCOUNTER — Other Ambulatory Visit: Payer: Self-pay | Admitting: Internal Medicine

## 2016-03-23 ENCOUNTER — Other Ambulatory Visit (HOSPITAL_COMMUNITY): Payer: Self-pay | Admitting: Adult Health

## 2016-03-23 ENCOUNTER — Other Ambulatory Visit: Payer: Self-pay | Admitting: Internal Medicine

## 2016-04-26 ENCOUNTER — Other Ambulatory Visit: Payer: Self-pay | Admitting: Internal Medicine

## 2016-08-25 ENCOUNTER — Other Ambulatory Visit (HOSPITAL_COMMUNITY): Payer: Self-pay | Admitting: Adult Health

## 2016-08-25 ENCOUNTER — Other Ambulatory Visit: Payer: Self-pay | Admitting: Internal Medicine

## 2016-09-26 ENCOUNTER — Other Ambulatory Visit: Payer: Self-pay | Admitting: Internal Medicine

## 2016-09-26 ENCOUNTER — Other Ambulatory Visit (HOSPITAL_COMMUNITY): Payer: Self-pay | Admitting: Adult Health

## 2016-10-24 ENCOUNTER — Other Ambulatory Visit: Payer: Self-pay | Admitting: Internal Medicine

## 2016-11-01 ENCOUNTER — Other Ambulatory Visit: Payer: Self-pay | Admitting: *Deleted

## 2016-11-03 ENCOUNTER — Ambulatory Visit (INDEPENDENT_AMBULATORY_CARE_PROVIDER_SITE_OTHER): Payer: Medicare Other | Admitting: Vascular Surgery

## 2016-11-03 ENCOUNTER — Encounter: Payer: Self-pay | Admitting: Vascular Surgery

## 2016-11-03 VITALS — BP 112/60 | HR 58 | Temp 97.5°F | Resp 20 | Ht 69.0 in | Wt 200.5 lb

## 2016-11-03 DIAGNOSIS — I714 Abdominal aortic aneurysm, without rupture, unspecified: Secondary | ICD-10-CM | POA: Insufficient documentation

## 2016-11-03 NOTE — Progress Notes (Signed)
Patient ID: Christopher [name], male   DOB: 1928/03/31, 83 y.o.   MRN: [id number]  Reason for Consult: New Evaluation (AAA 6.8; [facility] CD of CT with pt. )   Referred by Christopher AlbertLuking, William S, MD  Subjective:     HPI:  Christopher [name] is a 83 y.o. male who presents today with his caregiver from whom most of this history is obtained. He is a man who recently underwent a right inguinal hernia repair and a workup of that was found to have a large abdominal aortic aneurysm. He is a former smoker. He cannot tell me any history of family member with aneurysmal disease. He has never known about the aneurysm himself until having identified. He does not have new onset abdominal or back pain. He does not take blood thinners at this time does take Plavix and a statin drug had he had an MI in the past. His inguinal hernia was repaired last month and rocking him that he now has Foley catheter in place for urinary retention. He was also recently started on antibiotic and for his caregiver there has redness of his skin and she is going to contact primary care doctor regarding that. He also has had some blood in his urine previously had a GI bleed. He also has a history of dilated cardiomyopathy aortic stenosis and pulmonary fibrosis. Currently according to his caregiver he is debilitated beyond his usual level and is confused about most of the groins on around him.  Past Medical History:  Diagnosis Date  . AAA (abdominal aortic aneurysm) (HCC)   . BPH (benign prostatic hyperplasia)   . Hemorrhoids   . Hypertension    Family History  Problem Relation Age of Onset  . Other      no premature CAD.  Marland Kitchen. Congestive Heart Failure Sister    Past Surgical History:  Procedure Laterality Date  . CAROTID STENT    . LEFT HEART CATHETERIZATION WITH CORONARY ANGIOGRAM N/A 12/20/2012   Procedure: LEFT HEART CATHETERIZATION WITH CORONARY ANGIOGRAM;  Surgeon: Laurey Moralealton [name] McLean, MD;  [facility];  Service: Cardiovascular;  Laterality: N/A;  . PERCUTANEOUS CORONARY STENT INTERVENTION (PCI-[name]) N/A 12/25/2012   Procedure: PERCUTANEOUS CORONARY STENT INTERVENTION (PCI-[name]);  Surgeon: Kathleene Hazelhristopher D McAlhany, MD;  [facility];  Service: Cardiovascular;  Laterality: N/A;    Short Social History:  Social History  Substance Use Topics  . Smoking status: Former Smoker    Packs/day: 1.00    Years: 20.00    Types: Cigarettes    Quit date: 08/21/1958  . Smokeless tobacco: Never Used     Comment: quit over 40 yrs ago.  . Alcohol use No    Allergies  Allergen Reactions  . Cefuroxime Axetil Hives and Itching    Current Outpatient Prescriptions  Medication Sig Dispense Refill  . atorvastatin (LIPITOR) 40 MG tablet Take 40 mg by mouth daily.    . Cholecalciferol (VITAMIN D-3) 1000 UNITS CAPS Take 1 capsule by mouth daily.    . enalapril (VASOTEC) 5 MG tablet TAKE 1 TABLET BY MOUTH AT BEDTIME. 30 tablet 0  . furosemide (LASIX) 40 MG tablet TAKE 1 TABLET ONCE DAILY. 30 tablet 0  . meclizine (ANTIVERT) 12.5 MG tablet Take 12.5 mg by mouth 3 (three) times daily as needed for dizziness.    . metoprolol succinate (TOPROL-XL) 25 MG 24 hr tablet TAKE (1) TABLET DAILY AT BEDTIME. 30 tablet 0  . spironolactone (  ALDACTONE) 25 MG tablet TAKE 1 TABLET ONCE DAILY. 30 tablet 0  . clopidogrel (PLAVIX) 75 MG tablet TAKE 1 TABLET BY MOUTH DAILY WITH BREAKFAST. (Patient not taking: Reported on 11/03/2016) 30 tablet 0  . meloxicam (MOBIC) 15 MG tablet Take 15 mg by mouth daily.     No current facility-administered medications for this visit.     Review of Systems  Constitutional:  Constitutional negative. HENT: HENT negative.  Eyes: Eyes negative.  Cardiovascular: Positive for dyspnea with exertion and leg swelling.  GI: Positive for abdominal pain.  GU:       Catheter in place Skin: Positive for rash.  Neurological: Neurological negative. Hematologic: Hematologic/lymphatic negative.    Psychiatric: Positive for confusion.        Objective:  Objective   Vitals:   11/03/16 1235  BP: 112/60  Pulse: (!) 58  Resp: 20  Temp: 97.5 F (36.4 C)  TempSrc: Oral  SpO2: 96%  Weight: 200 lb 8 oz (90.9 kg)  Height: 5\' 9"  (1.753 m)   Body mass index is 29.61 kg/m.  Physical Exam  Constitutional: He appears well-developed.  HENT:  Head: Normocephalic.  Eyes: Pupils are equal, round, and reactive to light.  Neck: Normal range of motion.  Cardiovascular: Normal rate.   Pulmonary/Chest: Effort normal.  Abdominal: Soft. He exhibits no mass.  Genitourinary:  Genitourinary Comments: Scrotal swelling  Neurological: He is alert.  Not oriented   Skin: Rash noted.    Data: I have independently reviewed his CT scan which was read as 6.8  AAA, by my read is 6.3 cm infrarenal aneurysm with a infrarenal neck of approximately 20 mm that is 31 mm in diameter. His external iliac arteries bilaterally measured approximately 8 mm. There is 110 mm between the lowest renal artery on the left and the bifurcation of the aorta. It appears to be an adequate candidate for endovascular repair.     Assessment/Plan:    83 year old male presents with 6.3 cm abdominal aortic aneurysm. CT scan was brought along with him today. He unfortunately appears extremely debilitated from his usual state and was very difficult just to get into a seated position today for evaluation. He is also confused which per the caregiver is not the usual state. He does have a power of attorney which is his nephew and is not present today. He also recently underwent large right inguinal hernia repair which has now fully recovered from that has chronic indwelling Foley as well as skin rash possibly from an antibiotic that was recently started. We discussed the risks of possible rupture which would mean likely death in his case versus repair and given his current state of debilitation we will follow him up in 4-6 weeks with no  further studies possible consideration of repair. I have asked that power of attorney be present at that time consider definitive decision making can be made. I'll have the CT scan uploaded in the case he should rupture we have at for evaluation as I do think he is an endovascular candidate.     Maeola Harman MD [facility]

## 2016-11-04 ENCOUNTER — Inpatient Hospital Stay (HOSPITAL_COMMUNITY)
Admission: EM | Admit: 2016-11-04 | Discharge: 2016-11-08 | DRG: 292 | Disposition: A | Discharge status: Home Health | Payer: Medicare Other | Attending: Internal Medicine | Admitting: Internal Medicine

## 2016-11-04 ENCOUNTER — Emergency Department (HOSPITAL_COMMUNITY): Payer: Medicare Other

## 2016-11-04 ENCOUNTER — Encounter (HOSPITAL_COMMUNITY): Payer: Self-pay | Admitting: Emergency Medicine

## 2016-11-04 ENCOUNTER — Other Ambulatory Visit (HOSPITAL_COMMUNITY): Payer: Self-pay | Admitting: Adult Health

## 2016-11-04 DIAGNOSIS — I110 Hypertensive heart disease with heart failure: Principal | ICD-10-CM | POA: Diagnosis present

## 2016-11-04 DIAGNOSIS — I714 Abdominal aortic aneurysm, without rupture, unspecified: Secondary | ICD-10-CM | POA: Diagnosis present

## 2016-11-04 DIAGNOSIS — T501X6A Underdosing of loop [high-ceiling] diuretics, initial encounter: Secondary | ICD-10-CM | POA: Diagnosis present

## 2016-11-04 DIAGNOSIS — Z66 Do not resuscitate: Secondary | ICD-10-CM | POA: Diagnosis present

## 2016-11-04 DIAGNOSIS — Z9119 Patient's noncompliance with other medical treatment and regimen: Secondary | ICD-10-CM | POA: Diagnosis not present

## 2016-11-04 DIAGNOSIS — I5043 Acute on chronic combined systolic (congestive) and diastolic (congestive) heart failure: Secondary | ICD-10-CM | POA: Diagnosis present

## 2016-11-04 DIAGNOSIS — Z9889 Other specified postprocedural states: Secondary | ICD-10-CM

## 2016-11-04 DIAGNOSIS — Z79899 Other long term (current) drug therapy: Secondary | ICD-10-CM

## 2016-11-04 DIAGNOSIS — N3001 Acute cystitis with hematuria: Secondary | ICD-10-CM

## 2016-11-04 DIAGNOSIS — N390 Urinary tract infection, site not specified: Secondary | ICD-10-CM | POA: Diagnosis present

## 2016-11-04 DIAGNOSIS — Z87891 Personal history of nicotine dependence: Secondary | ICD-10-CM | POA: Diagnosis not present

## 2016-11-04 DIAGNOSIS — I481 Persistent atrial fibrillation: Secondary | ICD-10-CM | POA: Diagnosis not present

## 2016-11-04 DIAGNOSIS — I2510 Atherosclerotic heart disease of native coronary artery without angina pectoris: Secondary | ICD-10-CM | POA: Diagnosis not present

## 2016-11-04 DIAGNOSIS — Z91128 Patient's intentional underdosing of medication regimen for other reason: Secondary | ICD-10-CM

## 2016-11-04 DIAGNOSIS — K625 Hemorrhage of anus and rectum: Secondary | ICD-10-CM | POA: Diagnosis present

## 2016-11-04 DIAGNOSIS — Z8719 Personal history of other diseases of the digestive system: Secondary | ICD-10-CM | POA: Diagnosis not present

## 2016-11-04 DIAGNOSIS — I4891 Unspecified atrial fibrillation: Secondary | ICD-10-CM | POA: Diagnosis present

## 2016-11-04 DIAGNOSIS — I10 Essential (primary) hypertension: Secondary | ICD-10-CM | POA: Diagnosis present

## 2016-11-04 DIAGNOSIS — Z9861 Coronary angioplasty status: Secondary | ICD-10-CM

## 2016-11-04 DIAGNOSIS — Y92009 Unspecified place in unspecified non-institutional (private) residence as the place of occurrence of the external cause: Secondary | ICD-10-CM | POA: Diagnosis not present

## 2016-11-04 DIAGNOSIS — K649 Unspecified hemorrhoids: Secondary | ICD-10-CM | POA: Diagnosis present

## 2016-11-04 DIAGNOSIS — R748 Abnormal levels of other serum enzymes: Secondary | ICD-10-CM | POA: Diagnosis not present

## 2016-11-04 DIAGNOSIS — I5023 Acute on chronic systolic (congestive) heart failure: Secondary | ICD-10-CM | POA: Diagnosis present

## 2016-11-04 DIAGNOSIS — R778 Other specified abnormalities of plasma proteins: Secondary | ICD-10-CM | POA: Diagnosis present

## 2016-11-04 DIAGNOSIS — R7989 Other specified abnormal findings of blood chemistry: Secondary | ICD-10-CM | POA: Diagnosis present

## 2016-11-04 DIAGNOSIS — Z8249 Family history of ischemic heart disease and other diseases of the circulatory system: Secondary | ICD-10-CM

## 2016-11-04 LAB — TROPONIN I
TROPONIN I: 0.22 ng/mL — AB (ref <0.03)
Troponin I: 0.23 ng/mL (ref <0.03)

## 2016-11-04 LAB — COMPREHENSIVE METABOLIC PANEL
ALT: 17 U/L (ref 17–63)
ANION GAP: 8 (ref 5–15)
AST: 21 U/L (ref 15–41)
Albumin: 2.5 g/dL — ABNORMAL LOW (ref 3.5–5.0)
Alkaline Phosphatase: 56 U/L (ref 38–126)
BILIRUBIN TOTAL: 0.6 mg/dL (ref 0.3–1.2)
BUN: 33 mg/dL — ABNORMAL HIGH (ref 6–20)
CHLORIDE: 106 mmol/L (ref 101–111)
CO2: 22 mmol/L (ref 22–32)
Calcium: 8.1 mg/dL — ABNORMAL LOW (ref 8.9–10.3)
Creatinine, Ser: 1.2 mg/dL (ref 0.61–1.24)
GFR, EST NON AFRICAN AMERICAN: 53 mL/min — AB (ref >60)
Glucose, Bld: 106 mg/dL — ABNORMAL HIGH (ref 65–99)
POTASSIUM: 4.2 mmol/L (ref 3.5–5.1)
Sodium: 136 mmol/L (ref 135–145)
TOTAL PROTEIN: 6.2 g/dL — AB (ref 6.5–8.1)

## 2016-11-04 LAB — CBC WITH DIFFERENTIAL/PLATELET
Basophils Absolute: 0 10*3/uL (ref 0.0–0.1)
Basophils Relative: 0 %
EOS PCT: 9 %
Eosinophils Absolute: 1 10*3/uL — ABNORMAL HIGH (ref 0.0–0.7)
HCT: 33 % — ABNORMAL LOW (ref 39.0–52.0)
Hemoglobin: 10.2 g/dL — ABNORMAL LOW (ref 13.0–17.0)
LYMPHS ABS: 1.4 10*3/uL (ref 0.7–4.0)
LYMPHS PCT: 12 %
MCH: 24.1 pg — AB (ref 26.0–34.0)
MCHC: 30.9 g/dL (ref 30.0–36.0)
MCV: 78 fL (ref 78.0–100.0)
MONO ABS: 0.9 10*3/uL (ref 0.1–1.0)
MONOS PCT: 8 %
Neutro Abs: 8.2 10*3/uL — ABNORMAL HIGH (ref 1.7–7.7)
Neutrophils Relative %: 71 %
Platelets: 332 10*3/uL (ref 150–400)
RBC: 4.23 MIL/uL (ref 4.22–5.81)
RDW: 19.2 % — AB (ref 11.5–15.5)
WBC: 11.5 10*3/uL — AB (ref 4.0–10.5)

## 2016-11-04 LAB — TSH: TSH: 0.613 u[IU]/mL (ref 0.350–4.500)

## 2016-11-04 LAB — URINALYSIS, ROUTINE W REFLEX MICROSCOPIC
Bilirubin Urine: NEGATIVE
GLUCOSE, UA: NEGATIVE mg/dL
Ketones, ur: NEGATIVE mg/dL
NITRITE: NEGATIVE
PROTEIN: 30 mg/dL — AB
Specific Gravity, Urine: 1.019 (ref 1.005–1.030)
pH: 5 (ref 5.0–8.0)

## 2016-11-04 LAB — BRAIN NATRIURETIC PEPTIDE: B NATRIURETIC PEPTIDE 5: 635 pg/mL — AB (ref 0.0–100.0)

## 2016-11-04 MED ORDER — TAMSULOSIN HCL 0.4 MG PO CAPS
0.4000 mg | ORAL_CAPSULE | Freq: Every day | ORAL | Status: DC
  Administered 2016-11-04 – 2016-11-07 (×4): 0.4 mg via ORAL
  Filled 2016-11-04 (×4): qty 1

## 2016-11-04 MED ORDER — ASPIRIN EC 81 MG PO TBEC
81.0000 mg | DELAYED_RELEASE_TABLET | Freq: Every day | ORAL | Status: DC
  Administered 2016-11-04 – 2016-11-08 (×5): 81 mg via ORAL
  Filled 2016-11-04 (×5): qty 1

## 2016-11-04 MED ORDER — SODIUM CHLORIDE 0.9 % IV SOLN: 250.0000 mL | INTRAVENOUS | Status: DC | PRN

## 2016-11-04 MED ORDER — ATORVASTATIN CALCIUM 40 MG PO TABS
40.0000 mg | ORAL_TABLET | Freq: Every day | ORAL | Status: DC
  Administered 2016-11-05 – 2016-11-07 (×3): 40 mg via ORAL
  Filled 2016-11-04 (×3): qty 1

## 2016-11-04 MED ORDER — CIPROFLOXACIN HCL 250 MG PO TABS
500.0000 mg | ORAL_TABLET | Freq: Two times a day (BID) | ORAL | Status: DC
  Administered 2016-11-04 – 2016-11-08 (×8): 500 mg via ORAL
  Filled 2016-11-04 (×8): qty 2

## 2016-11-04 MED ORDER — MECLIZINE HCL 12.5 MG PO TABS: 12.5000 mg | ORAL_TABLET | Freq: Three times a day (TID) | ORAL | Status: DC | PRN

## 2016-11-04 MED ORDER — ONDANSETRON HCL 4 MG/2ML IJ SOLN: 4.0000 mg | Freq: Four times a day (QID) | INTRAMUSCULAR | Status: DC | PRN

## 2016-11-04 MED ORDER — RIVAROXABAN 20 MG PO TABS
20.0000 mg | ORAL_TABLET | Freq: Every day | ORAL | Status: DC
  Administered 2016-11-04 – 2016-11-06 (×3): 20 mg via ORAL
  Filled 2016-11-04 (×3): qty 1

## 2016-11-04 MED ORDER — SODIUM CHLORIDE 0.9% FLUSH
3.0000 mL | Freq: Two times a day (BID) | INTRAVENOUS | Status: DC
  Administered 2016-11-04 – 2016-11-08 (×8): 3 mL via INTRAVENOUS

## 2016-11-04 MED ORDER — FUROSEMIDE 10 MG/ML IJ SOLN
60.0000 mg | Freq: Once | INTRAMUSCULAR | Status: AC
  Administered 2016-11-04: 60 mg via INTRAVENOUS
  Filled 2016-11-04: qty 6

## 2016-11-04 MED ORDER — SODIUM CHLORIDE 0.9% FLUSH: 3.0000 mL | INTRAVENOUS | Status: DC | PRN

## 2016-11-04 MED ORDER — ACETAMINOPHEN 325 MG PO TABS
650.0000 mg | ORAL_TABLET | ORAL | Status: DC | PRN
Start: 2016-11-04 — End: 2016-11-08

## 2016-11-04 MED ORDER — ASPIRIN 81 MG PO CHEW
324.0000 mg | CHEWABLE_TABLET | Freq: Once | ORAL | Status: AC
  Administered 2016-11-04: 324 mg via ORAL
  Filled 2016-11-04: qty 4

## 2016-11-04 MED ORDER — FUROSEMIDE 10 MG/ML IJ SOLN
60.0000 mg | Freq: Three times a day (TID) | INTRAMUSCULAR | Status: DC
  Administered 2016-11-04 – 2016-11-05 (×2): 60 mg via INTRAVENOUS
  Filled 2016-11-04 (×2): qty 6

## 2016-11-04 MED ORDER — ENALAPRIL MALEATE 5 MG PO TABS
5.0000 mg | ORAL_TABLET | Freq: Every day | ORAL | Status: DC
  Administered 2016-11-04 – 2016-11-07 (×4): 5 mg via ORAL
  Filled 2016-11-04 (×4): qty 1

## 2016-11-04 MED ORDER — SPIRONOLACTONE 25 MG PO TABS
25.0000 mg | ORAL_TABLET | Freq: Every day | ORAL | Status: DC
Start: 2016-11-05 — End: 2016-11-08
  Administered 2016-11-05 – 2016-11-08 (×4): 25 mg via ORAL
  Filled 2016-11-04 (×4): qty 1

## 2016-11-04 MED ORDER — CLOPIDOGREL BISULFATE 75 MG PO TABS
75.0000 mg | ORAL_TABLET | Freq: Every day | ORAL | Status: DC
  Administered 2016-11-04 – 2016-11-08 (×5): 75 mg via ORAL
  Filled 2016-11-04 (×5): qty 1

## 2016-11-04 NOTE — H&P (Signed)
History and Physical  Christopher [name] JOA:[id number] DOB: 29-Oct-1927 DOA: 11/04/2016  Referring physician: Dr Ranae Palms, ED physician PCP: Lubertha South, MD  Outpatient Specialists:   Dr Randie Heinz (Vasc)  Dr Marchelle Gearing (Pulm)  [facility] (although hasn't been seen in a few years)  Patient Coming From: home  Chief Complaint: leg swelling  HPI: Christopher [name] is a 83 y.o. male with a history of hypertension, coronary artery disease status post PCI in 2014, BPH, abdominal aneurysm about 6.3 cm infrarenal aneurysm. He also has a history of CHF with an EF of 5-50% on 07/10/2013. The patient recently had a left inguinal hernia repair and at [facility] approximately 3 weeks ago. On the same time, the patient ran out of his diuretics: Both his Lasix and his spironolactone. He attempted to call his cardiologist, who didn't prescribing his medications, but they declined to refill his medications as he has not been seen for more than 2 years. Over the next 3 weeks, his legs and arms have been swollen. He has been having increasing shortness of breath especially with any exertion. He does have a cough with white sputum production and orthopnea. He denies chest pain, palpitations, irregular heartbeat. He has never been told he has irregular heartbeat in the past.  The surgery, the patient has had difficult time ambulating. Additionally, he has had urinary engine and has a Foley catheter in place. He was recently diagnosed with a UTI and was placed on cefuroxime, although he developed a rash to this and was switched to ciprofloxacin to start today.  Emergency Department Course: Chest x-ray shows mild vascular congestion with mild pulmonary edema. His BNP is elevated and his troponins elevated to 0.22. ER called the cardiologist's, however the cardiologist on call deferred the admission to the hospitalist service.  Review of Systems:   Pt denies any fevers, chills, nausea, vomiting, diarrhea,  constipation, abdominal pain, wheezing, palpitations, headache, vision changes, lightheadedness, dizziness, melena, rectal bleeding.  Review of systems are otherwise negative  Past Medical History:  Diagnosis Date  . AAA (abdominal aortic aneurysm) (HCC)   . BPH (benign prostatic hyperplasia)   . Hemorrhoids   . Hypertension    Past Surgical History:  Procedure Laterality Date  . CAROTID STENT    . LEFT HEART CATHETERIZATION WITH CORONARY ANGIOGRAM N/A 12/20/2012   Procedure: LEFT HEART CATHETERIZATION WITH CORONARY ANGIOGRAM;  Surgeon: Laurey Morale, MD;  Location: [facility] CATH LAB;  Service: Cardiovascular;  Laterality: N/A;  . PERCUTANEOUS CORONARY STENT INTERVENTION (PCI-S) N/A 12/25/2012   Procedure: PERCUTANEOUS CORONARY STENT INTERVENTION (PCI-S);  Surgeon: Kathleene Hazel, MD;  Location: [facility] CATH LAB;  Service: Cardiovascular;  Laterality: N/A;   Social History:  reports that he quit smoking about 58 years ago. His smoking use included Cigarettes. He has a 20.00 pack-year smoking history. He has never used smokeless tobacco. He reports that he does not drink alcohol or use drugs. Patient lives at Home  Allergies  Allergen Reactions  . Cefuroxime Axetil Hives and Itching    Family History  Problem Relation Age of Onset  . Other      no premature CAD.  Marland Kitchen Congestive Heart Failure Sister       Prior to Admission medications   Medication Sig Start Date End Date Taking? Authorizing Provider  atorvastatin (LIPITOR) 40 MG tablet Take 40 mg by mouth daily.   Yes Historical Provider, MD  cholecalciferol (VITAMIN D) 1000 units tablet Take 1,000 Units by mouth daily.  Yes Historical Provider, MD  enalapril (VASOTEC) 5 MG tablet TAKE 1 TABLET BY MOUTH AT BEDTIME. 09/26/16  Yes Dolores Pattyaniel R Bensimhon, MD  meclizine (ANTIVERT) 12.5 MG tablet Take 12.5 mg by mouth 3 (three) times daily as needed for dizziness.   Yes Historical Provider, MD  metoprolol succinate (TOPROL-XL) 25 MG 24 hr tablet  TAKE (1) TABLET DAILY AT BEDTIME. 09/26/16  Yes Amy D Clegg, NP  spironolactone (ALDACTONE) 25 MG tablet TAKE 1 TABLET ONCE DAILY. 09/26/16  Yes Dolores Pattyaniel R Bensimhon, MD  tamsulosin (FLOMAX) 0.4 MG CAPS capsule Take 0.4 mg by mouth at bedtime.   Yes Historical Provider, MD  ciprofloxacin (CIPRO) 500 MG tablet Take 500 mg by mouth 2 (two) times daily. 11/04/16 11/11/16  Historical Provider, MD    Physical Exam: BP (!) 125/48   Pulse (!) 54   Temp 97.9 F (36.6 C) (Oral)   Resp 18   Ht 5\' 9"  (1.753 m)   Wt 90.7 kg (200 lb)   SpO2 98%   BMI 29.53 kg/m   General: Elderly Caucasian male. Awake and alert and oriented x3. No acute cardiopulmonary distress.  HEENT: Normocephalic atraumatic.  Right and left ears normal in appearance.  Pupils equal, round, reactive to light. Extraocular muscles are intact. Sclerae anicteric and noninjected.  Moist mucosal membranes. No mucosal lesions.  Neck: Neck supple without lymphadenopathy. No carotid bruits. No masses palpated.  Cardiovascular: Regular rate with normal S1-S2 sounds. No murmurs, rubs, gallops auscultated. JVD to approximately 10 cm. 3-4+ pitting edema in his lower extremities. Correction rays are also edematous. Respiratory: Good respiratory effort with no wheezes, rales, rhonchi. Lungs clear to auscultation bilaterally.  No accessory muscle use. Abdomen: Soft, nontender, nondistended. Active bowel sounds. No masses or hepatosplenomegaly  Skin: No rashes, lesions, or ulcerations.  Dry, warm to touch. 2+ dorsalis pedis and radial pulses. Musculoskeletal: No calf or leg pain. All major joints not erythematous nontender.  No upper or lower joint deformation.  Good ROM.  No contractures  Psychiatric: Intact judgment and insight. Pleasant and cooperative. Neurologic: No focal neurological deficits. Strength is 5/5 and symmetric in upper and lower extremities.  Cranial nerves II through XII are grossly intact.           Labs on Admission: I have  personally reviewed following labs and imaging studies  CBC:  Recent Labs Lab 11/04/16 1736  WBC 11.5*  NEUTROABS 8.2*  HGB 10.2*  HCT 33.0*  MCV 78.0  PLT 332   Basic Metabolic Panel:  Recent Labs Lab 11/04/16 1736  NA 136  K 4.2  CL 106  CO2 22  GLUCOSE 106*  BUN 33*  CREATININE 1.20  CALCIUM 8.1*   GFR: Estimated Creatinine Clearance: 49.2 mL/min (by C-G formula based on SCr of 1.2 mg/dL). Liver Function Tests:  Recent Labs Lab 11/04/16 1736  AST 21  ALT 17  ALKPHOS 56  BILITOT 0.6  PROT 6.2*  ALBUMIN 2.5*   No results for input(s): LIPASE, AMYLASE in the last 168 hours. No results for input(s): AMMONIA in the last 168 hours. Coagulation Profile: No results for input(s): INR, PROTIME in the last 168 hours. Cardiac Enzymes:  Recent Labs Lab 11/04/16 1736  TROPONINI 0.22*   BNP (last 3 results) No results for input(s): PROBNP in the last 8760 hours. HbA1C: No results for input(s): HGBA1C in the last 72 hours. CBG: No results for input(s): GLUCAP in the last 168 hours. Lipid Profile: No results for input(s): CHOL, HDL, LDLCALC, TRIG,  CHOLHDL, LDLDIRECT in the last 72 hours. Thyroid Function Tests: No results for input(s): TSH, T4TOTAL, FREET4, T3FREE, THYROIDAB in the last 72 hours. Anemia Panel: No results for input(s): VITAMINB12, FOLATE, FERRITIN, TIBC, IRON, RETICCTPCT in the last 72 hours. Urine analysis:    Component Value Date/Time   COLORURINE YELLOW 11/04/2016 1734   APPEARANCEUR CLOUDY (A) 11/04/2016 1734   LABSPEC 1.019 11/04/2016 1734   PHURINE 5.0 11/04/2016 1734   GLUCOSEU NEGATIVE 11/04/2016 1734   HGBUR LARGE (A) 11/04/2016 1734   BILIRUBINUR NEGATIVE 11/04/2016 1734   KETONESUR NEGATIVE 11/04/2016 1734   PROTEINUR 30 (A) 11/04/2016 1734   UROBILINOGEN 4.0 (H) 12/24/2012 0929   NITRITE NEGATIVE 11/04/2016 1734   LEUKOCYTESUR LARGE (A) 11/04/2016 1734   Sepsis Labs: @LABRCNTIP (procalcitonin:4,lacticidven:4) )No results  found for this or any previous visit (from the past 240 hour(s)).   Radiological Exams on Admission: Dg Chest 2 View  Result Date: 11/04/2016 CLINICAL DATA:  Short of breath, swelling in extremities and eyelids. EXAM: CHEST  2 VIEW COMPARISON:  10/16/2016 FINDINGS: Normal cardiac silhouette. There is mild interstitial edema pattern similar to comparison exam. This interstitial pattern is superimposed on chronic interstitial lung disease. No effusion, infiltrate pneumothorax. IMPRESSION: Interstitial edema superimposed on chronic interstitial lung disease. Electronically Signed   By: Genevive Bi M.D.   On: 11/04/2016 18:46    EKG: Independently reviewed. Atrial fibrillation  Assessment/Plan: Principal Problem:   Acute on chronic systolic heart failure (HCC) Active Problems:   UTI (urinary tract infection)   Chronic coronary artery disease   HTN (hypertension)   AAA (abdominal aortic aneurysm) without rupture (HCC)   Elevated troponin   Atrial fibrillation (HCC)   H/O inguinal hernia repair    This patient was discussed with the ED physician, including pertinent vitals, physical exam findings, labs, and imaging.  We also discussed care given by the ED provider.  #1 acute on chronic systolic heart failure  Admit to telemetry Telemetry monitoring Strict I/O Daily Weights Diuresis: Lasix 60 mg 3 times a day, spironolactone 25 mg daily - dose tonight Potassium: 40 mEq twice a day by mouth Echo cardiac exam tomorrow Repeat BMP tomorrow #2 atrial fibrillation  Start Xarelto  EchoTomorrow  Check TSH  Follow troponins #3 elevated troponin  Possibly secondary to #1 and/or #2  Will trend the troponins  I feel the patient is stable to keep here given the low level of troponin and the patient is completely asymptomatic. #4 UTI  Prophylaxis and 7 days #5 history of inguinal hernia repair  Postoperatively, the patient is doing fine  We'll have PT see the patient for  mobilization #6 hypertension  Continue antihypertensives, but will hold the beta blocker #7 AAA  Currently stable  DVT prophylaxis: Start Xarelto Consultants: None Code Status: DO NOT RESUSCITATE Family Communication: Niece in the room  Disposition Plan: Pending   Levie Heritage, DO [facility] Pager [phone]  If 7PM-7AM, please contact night-coverage [email or web address] Password [email or web address]

## 2016-11-04 NOTE — ED Triage Notes (Signed)
Pt reports bilateral upper/lower extremity edema that began this morning. Pt has a Foley from a recent hernia surgery.

## 2016-11-04 NOTE — ED Notes (Signed)
CRITICAL VALUE ALERT  Critical value received:  0.22 Troponin  Date of notification:  11/04/2016  Time of notification:  1832  Critical value read back:  Yes  Nurse who received alert:  Sophronia SimasJackie Navina Wohlers  MD notified (1st page):  Dr. Ranae PalmsYelverton  Time of first page:  [phone]  Responding MD:  Dr. Ranae PalmsYelverton  Time MD responded:  [phone]

## 2016-11-04 NOTE — ED Provider Notes (Signed)
[facility]-EMERGENCY DEPT Provider Note   CSN: [id number] Arrival date & time: 11/04/16  1654     History   Chief Complaint Chief Complaint  Patient presents with  . Leg Swelling    HPI Christopher [name] is a 83 y.o. male.  HPI Patient states he's been out of his Lasix for the last week. He's had increased swelling in bilateral upper and lower extremities. Has had increased shortness of breath especially with any exertion. Has had cough productive of white sputum. Denies any chest pain. No abdominal pain. Has had chills without documented fever. Past Medical History:  Diagnosis Date  . AAA (abdominal aortic aneurysm) (HCC)   . BPH (benign prostatic hyperplasia)   . Hemorrhoids   . Hypertension     Patient Active Problem List   Diagnosis Date Noted  . Acute on chronic systolic heart failure (HCC) 11/04/2016  . AAA (abdominal aortic aneurysm) without rupture (HCC) 11/03/2016  . HTN (hypertension) 07/10/2013  . Chronic coronary artery disease 04/30/2013  . 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 Scleroderma (HCC) 01/09/2013  . Chronic systolic heart failure (HCC) 01/09/2013  . Nodule of right lung, 8 mm right lower lobe in May 2014 01/01/2013  . Pneumonitis 01/01/2013  . Esophageal dysmotility 01/01/2013  . Arteriosclerotic cardiovascular disease (ASCVD) 12/29/2012  . Acute-on-chronic respiratory failure (HCC) 12/26/2012  . UTI (urinary tract infection) 12/24/2012    Past Surgical History:  Procedure Laterality Date  . CAROTID STENT    . LEFT HEART CATHETERIZATION WITH CORONARY ANGIOGRAM N/A 12/20/2012   Procedure: LEFT HEART CATHETERIZATION WITH CORONARY ANGIOGRAM;  Surgeon: Laurey Moralealton S McLean, MD;  [facility];  Service: Cardiovascular;  Laterality: N/A;  . PERCUTANEOUS CORONARY STENT INTERVENTION (PCI-S) N/A 12/25/2012   Procedure: PERCUTANEOUS  CORONARY STENT INTERVENTION (PCI-S);  Surgeon: Kathleene Hazelhristopher D McAlhany, MD;  [facility];  Service: Cardiovascular;  Laterality: N/A;       Home Medications    Prior to Admission medications   Medication Sig Start Date End Date Taking? Authorizing Provider  atorvastatin (LIPITOR) 40 MG tablet Take 40 mg by mouth daily.   Yes Historical Provider, MD  cholecalciferol (VITAMIN D) 1000 units tablet Take 1,000 Units by mouth daily.   Yes Historical Provider, MD  enalapril (VASOTEC) 5 MG tablet TAKE 1 TABLET BY MOUTH AT BEDTIME. 09/26/16  Yes Dolores Pattyaniel R Bensimhon, MD  meclizine (ANTIVERT) 12.5 MG tablet Take 12.5 mg by mouth 3 (three) times daily as needed for dizziness.   Yes Historical Provider, MD  metoprolol succinate (TOPROL-XL) 25 MG 24 hr tablet TAKE (1) TABLET DAILY AT BEDTIME. 09/26/16  Yes Amy D Clegg, NP  spironolactone (ALDACTONE) 25 MG tablet TAKE 1 TABLET ONCE DAILY. 09/26/16  Yes Dolores Pattyaniel R Bensimhon, MD  tamsulosin (FLOMAX) 0.4 MG CAPS capsule Take 0.4 mg by mouth at bedtime.   Yes Historical Provider, MD  ciprofloxacin (CIPRO) 500 MG tablet Take 500 mg by mouth 2 (two) times daily. 11/04/16 11/11/16  Historical Provider, MD    Family History Family History  Problem Relation Age of Onset  . Other      no premature CAD.  Marland Kitchen. Congestive Heart Failure Sister     Social History Social History  Substance Use Topics  . Smoking status: Former Smoker    Packs/day: 1.00    Years: 20.00    Types: Cigarettes    Quit date: 08/21/1958  . Smokeless  tobacco: Never Used     Comment: quit over 40 yrs ago.  . Alcohol use No     Allergies   Cefuroxime axetil   Review of Systems Review of Systems  Constitutional: Negative for chills and fever.  HENT: Positive for facial swelling.   Respiratory: Positive for cough and shortness of breath.   Cardiovascular: Positive for leg swelling. Negative for chest pain and palpitations.  Gastrointestinal: Positive for abdominal distention.  Negative for abdominal pain, constipation, diarrhea, nausea and vomiting.  Genitourinary: Negative for dysuria, flank pain and frequency.  Musculoskeletal: Negative for back pain, neck pain and neck stiffness.  Skin: Positive for rash. Negative for wound.  Neurological: Negative for dizziness, weakness, light-headedness, numbness and headaches.  All other systems reviewed and are negative.    Physical Exam Updated Vital Signs BP (!) 157/57   Pulse 61   Temp 97.9 F (36.6 C) (Oral)   Resp 16   Ht 5\' 9"  (1.753 m)   Wt 200 lb (90.7 kg)   SpO2 94%   BMI 29.53 kg/m   Physical Exam  Constitutional: He is oriented to person, place, and time. He appears well-developed and well-nourished. No distress.  HENT:  Head: Normocephalic and atraumatic.  Mouth/Throat: Oropharynx is clear and moist. No oropharyngeal exudate.  Eyes: EOM are normal. Pupils are equal, round, and reactive to light.  Neck: Normal range of motion. Neck supple.  Cardiovascular: Normal rate and regular rhythm.  Exam reveals no gallop and no friction rub.   No murmur heard. Pulmonary/Chest: Effort normal. He has rales.  Crackles in bilateral lung fields. no respiratory distress.  Abdominal: Soft. Bowel sounds are normal. He exhibits distension. There is no tenderness. There is no rebound and no guarding.  Musculoskeletal: Normal range of motion. He exhibits edema. He exhibits no tenderness.  2+ bilateral lower extremity pitting edema. No asymmetry. Distal pulses intact. Patient has bilateral upper extremity swelling.  Neurological: He is alert and oriented to person, place, and time.  Moving all extremities without deficit. Sensation fully intact.  Skin: Skin is warm and dry. Rash noted. No erythema.  Chronic appearing scaly erythematous maculo-papular rash covering most of the patient's skin.  Psychiatric: He has a normal mood and affect. His behavior is normal.  Nursing note and vitals reviewed.    ED Treatments /  Results  Labs (all labs ordered are listed, but only abnormal results are displayed) Labs Reviewed  CBC WITH DIFFERENTIAL/PLATELET - Abnormal; Notable for the following:       Result Value   WBC 11.5 (*)    Hemoglobin 10.2 (*)    HCT 33.0 (*)    MCH 24.1 (*)    RDW 19.2 (*)    Neutro Abs 8.2 (*)    Eosinophils Absolute 1.0 (*)    All other components within normal limits  COMPREHENSIVE METABOLIC PANEL - Abnormal; Notable for the following:    Glucose, Bld 106 (*)    BUN 33 (*)    Calcium 8.1 (*)    Total Protein 6.2 (*)    Albumin 2.5 (*)    GFR calc non Af Amer 53 (*)    All other components within normal limits  BRAIN NATRIURETIC PEPTIDE - Abnormal; Notable for the following:    B Natriuretic Peptide 635.0 (*)    All other components within normal limits  TROPONIN I - Abnormal; Notable for the following:    Troponin I 0.22 (*)    All other components within normal limits  URINALYSIS, ROUTINE W REFLEX MICROSCOPIC  TROPONIN I  TROPONIN I    EKG  EKG Interpretation  Date/Time:  Saturday November 04 2016 17:13:25 EDT Ventricular Rate:  47 PR Interval:    QRS Duration: 95 QT Interval:  481 QTC Calculation: 426 R Axis:   34 Text Interpretation:  Atrial fibrillation Low voltage, extremity leads Anteroseptal infarct, old Abnormal T, consider ischemia, lateral leads Confirmed by Ranae Palms  MD, Krystol Rocco ([id number]) on 11/04/2016 6:05:59 PM Also confirmed by Ranae Palms  MD, Krystine Pabst ([id number])  on 11/04/2016 6:42:03 PM       Radiology Dg Chest 2 View  Result Date: 11/04/2016 CLINICAL DATA:  Short of breath, swelling in extremities and eyelids. EXAM: CHEST  2 VIEW COMPARISON:  10/16/2016 FINDINGS: Normal cardiac silhouette. There is mild interstitial edema pattern similar to comparison exam. This interstitial pattern is superimposed on chronic interstitial lung disease. No effusion, infiltrate pneumothorax. IMPRESSION: Interstitial edema superimposed on chronic interstitial lung disease.  Electronically Signed   By: Genevive Bi M.D.   On: 11/04/2016 18:46    Procedures Procedures (including critical care time)  Medications Ordered in ED Medications  furosemide (LASIX) injection 60 mg (60 mg Intravenous Given 11/04/16 1757)  aspirin chewable tablet 324 mg (324 mg Oral Given 11/04/16 1858)     Initial Impression / Assessment and Plan / ED Course  I have reviewed the triage vital signs and the nursing notes.  Pertinent labs & imaging results that were available during my care of the patient were reviewed by me and considered in my medical decision making (see chart for details).     Patient diuresed roughly 1 L after IV Lasix given. States he is feeling better. Discussed with cardiology on-call and recommended admission to [facility] and trending troponins as well as diuresis. Discussed with Dr. Gust Rung. We'll see patient in emergency department and admit.  Final Clinical Impressions(s) / ED Diagnoses   Final diagnoses:  Acute on chronic systolic congestive heart failure [facility])    New Prescriptions New Prescriptions   No medications on file     Loren Racer, MD 11/04/16 1932

## 2016-11-04 NOTE — ED Notes (Signed)
Call niece, Bruce DonathSylvia Woods for any changes, [phone]

## 2016-11-05 ENCOUNTER — Inpatient Hospital Stay (HOSPITAL_COMMUNITY): Payer: Medicare Other

## 2016-11-05 DIAGNOSIS — I4891 Unspecified atrial fibrillation: Secondary | ICD-10-CM

## 2016-11-05 LAB — ECHOCARDIOGRAM COMPLETE
AO mean calculated velocity dopler: 128 cm/s
AOPV: 0.54 m/s
AOVTI: 42.3 cm
AV Area VTI index: 0.71 cm2/m2
AV Area VTI: 1.37 cm2
AV Area mean vel: 1.42 cm2
AV Mean grad: 9 mmHg
AV VEL mean LVOT/AV: 0.56
AV area mean vel ind: 0.68 cm2/m2
AVA: 1.48 cm2
AVLVOTPG: 6 mmHg
AVPG: 22 mmHg
AVPKVEL: 232 cm/s
CHL CUP AV PEAK INDEX: 0.66
CHL CUP AV VEL: 1.48
CHL CUP DOP CALC LVOT VTI: 24.7 cm
CHL CUP STROKE VOLUME: 50 mL
CHL CUP TV REG PEAK VELOCITY: 284 cm/s
E decel time: 285 msec
FS: 16 % — AB (ref 28–44)
Height: 69 in
IVS/LV PW RATIO, ED: 1.19
LA ID, A-P, ES: 45 mm
LA diam end sys: 45 mm
LA vol index: 37.3 mL/m2
LA vol: 77.7 mL
LADIAMINDEX: 2.16 cm/m2
LAVOLA4C: 71.3 mL
LV PW d: 9.68 mm — AB (ref 0.6–1.1)
LV sys vol index: 26 mL/m2
LVDIAVOL: 105 mL (ref 62–150)
LVDIAVOLIN: 50 mL/m2
LVOT SV: 63 mL
LVOT area: 2.54 cm2
LVOT diameter: 18 mm
LVOT peak VTI: 0.58 cm
LVOTPV: 125 cm/s
LVSYSVOL: 55 mL (ref 21–61)
MV Dec: 285
MV Peak grad: 7 mmHg
MV pk E vel: 129 m/s
RV TAPSE: 20.7 mm
RV sys press: 35 mmHg
Simpson's disk: 48
TR max vel: 284 cm/s
Valve area index: 0.71
Weight: 2939.2 oz

## 2016-11-05 LAB — BASIC METABOLIC PANEL
ANION GAP: 7 (ref 5–15)
BUN: 30 mg/dL — ABNORMAL HIGH (ref 6–20)
CHLORIDE: 109 mmol/L (ref 101–111)
CO2: 25 mmol/L (ref 22–32)
Calcium: 7.8 mg/dL — ABNORMAL LOW (ref 8.9–10.3)
Creatinine, Ser: 1.19 mg/dL (ref 0.61–1.24)
GFR calc Af Amer: >60 mL/min (ref >60)
GFR, EST NON AFRICAN AMERICAN: 53 mL/min — AB (ref >60)
GLUCOSE: 89 mg/dL (ref 65–99)
POTASSIUM: 4 mmol/L (ref 3.5–5.1)
Sodium: 141 mmol/L (ref 135–145)

## 2016-11-05 LAB — TROPONIN I
Troponin I: 0.21 ng/mL (ref <0.03)
Troponin I: 0.22 ng/mL (ref <0.03)

## 2016-11-05 MED ORDER — POTASSIUM CHLORIDE CRYS ER 20 MEQ PO TBCR
40.0000 meq | EXTENDED_RELEASE_TABLET | Freq: Two times a day (BID) | ORAL | Status: DC
  Administered 2016-11-05 – 2016-11-08 (×8): 40 meq via ORAL
  Filled 2016-11-05: qty 4
  Filled 2016-11-05 (×7): qty 2

## 2016-11-05 MED ORDER — FUROSEMIDE 10 MG/ML IJ SOLN
60.0000 mg | Freq: Two times a day (BID) | INTRAMUSCULAR | Status: DC
  Administered 2016-11-05 – 2016-11-08 (×6): 60 mg via INTRAVENOUS
  Filled 2016-11-05 (×6): qty 6

## 2016-11-05 NOTE — Progress Notes (Signed)
*  PRELIMINARY RESULTS* Echocardiogram 2D Echocardiogram has been performed.  Stacey DrainWhite, Jameer Storie J 11/05/2016, 3:07 PM

## 2016-11-05 NOTE — Progress Notes (Signed)
CRITICAL VALUE ALERT  Critical value received:  Troponin 0.23, Previous 0.22.  Date of notification:  This RN not notified.   Time of notification:    Critical value read back:No.  Nurse who received alert:  Joni ReiningNicole, RN observed on computer chart  MD notified (1st page):  Nelson ChimesAmin  Time of first page:  [phone]  MD notified (2nd page):  Time of second page:  Responding MD:    Time MD responded:

## 2016-11-05 NOTE — Progress Notes (Signed)
PROGRESS NOTE    Christopher MarekWilbert J Herbert  ZOX:[id number] DOB: August 27, 1927 DOA: 11/04/2016 PCP: Lubertha SouthSteve Luking, MD    Brief Narrative: 83 yo with hx HTN, CAD s/p PCI, AAA at 6.3cm, CHF, ran out of diuretics, but couldn't get it filled as he has been non compliant with office follow up, admitted for anasarca.  He was found to have afib, and was in pulmonary edema.  He felt better with IV Diuresis, along with started on Xarelto, and he was Tx with Cipro for a UTI.    Assessment & Plan:   Principal Problem:   Acute on chronic systolic heart failure (HCC) Active Problems:   UTI (urinary tract infection)   Chronic coronary artery disease   HTN (hypertension)   AAA (abdominal aortic aneurysm) without rupture (HCC)   Elevated troponin   Atrial fibrillation (HCC)   H/O inguinal hernia repair  Diastolic CHF:   Will continue with IV Lasix, decrease dose as well.  Follow I/O and follow Cr.  UTI:  Continue with oral Cipro.  HTN:  Continue with meds.  Afib:  Xarelto restarted.  Continue with meds.  Rate is controlled.   AAA:  Will need follow up with vascular surgery. Obtain Abdominal US to reassess size.    DVT prophylaxis: Xarelto Code Status:  DNR.  Family Communication: Nephew at bedside.  Disposition Plan: home when appropriate.   Consultants:   None.   Procedures:   None.   Antimicrobials: Anti-infectives    Start     Dose/Rate Route Frequency Ordered Stop   11/04/16 2100  ciprofloxacin (CIPRO) tablet 500 mg     500 mg Oral 2 times daily 11/04/16 2021         Subjective:  Feeling better.  No abdominal pain, no chest pain.    Objective: Vitals:   11/04/16 1955 11/04/16 2020 11/05/16 0645 11/05/16 1150  BP: (!) 125/48 (!) 154/72 (!) 145/72   Pulse: (!) 54 (!) 55    Resp: 18 20 17    Temp:  98.1 F (36.7 C) 97.5 F (36.4 C)   TempSrc:  Oral Oral   SpO2: 98% 97% 100%   Weight:  87.7 kg (193 lb 4.8 oz) 87.6 kg (193 lb 2 oz) 83.3 kg (183 lb 11.2 oz)  Height:  5\' 9"  (1.753  m)      Intake/Output Summary (Last 24 hours) at 11/05/16 1643 Last data filed at 11/05/16 1144  Gross per 24 hour  Intake              430 ml  Output             5600 ml  Net            -5170 ml   Filed Weights   11/04/16 2020 11/05/16 0645 11/05/16 1150  Weight: 87.7 kg (193 lb 4.8 oz) 87.6 kg (193 lb 2 oz) 83.3 kg (183 lb 11.2 oz)    Examination:  General exam: Appears calm and comfortable  Respiratory system: Clear to auscultation. Respiratory effort normal. Cardiovascular system: S1 & S2 heard, RRR. No JVD, murmurs, rubs, gallops or clicks. No pedal edema. Gastrointestinal system: Abdomen is nondistended, soft and nontender. No organomegaly or masses felt. Normal bowel sounds heard. Central nervous system: Alert and oriented. No focal neurological deficits. Extremities: Symmetric 5 x 5 power. Skin: No rashes, lesions or ulcers Psychiatry: Judgement and insight appear normal. Mood & affect appropriate.   Data Reviewed: I have personally reviewed following labs and imaging studies  CBC:  Recent Labs Lab 11/04/16 1736  WBC 11.5*  NEUTROABS 8.2*  HGB 10.2*  HCT 33.0*  MCV 78.0  PLT 332   Basic Metabolic Panel:  Recent Labs Lab 11/04/16 1736 11/05/16 0431  NA 136 141  K 4.2 4.0  CL 106 109  CO2 22 25  GLUCOSE 106* 89  BUN 33* 30*  CREATININE 1.20 1.19  CALCIUM 8.1* 7.8*   GFR: Estimated Creatinine Clearance: 44.6 mL/min (by C-G formula based on SCr of 1.19 mg/dL). Liver Function Tests:  Recent Labs Lab 11/04/16 1736  AST 21  ALT 17  ALKPHOS 56  BILITOT 0.6  PROT 6.2*  ALBUMIN 2.5*    Recent Labs Lab 11/04/16 1736 11/04/16 2122 11/05/16 0431 11/05/16 0846  TROPONINI 0.22* 0.23* 0.21* 0.22*   Thyroid Function Tests:  Recent Labs  11/04/16 2122  TSH 0.613    Radiology Studies: Dg Chest 2 View  Result Date: 11/04/2016 CLINICAL DATA:  Short of breath, swelling in extremities and eyelids. EXAM: CHEST  2 VIEW COMPARISON:  10/16/2016  FINDINGS: Normal cardiac silhouette. There is mild interstitial edema pattern similar to comparison exam. This interstitial pattern is superimposed on chronic interstitial lung disease. No effusion, infiltrate pneumothorax. IMPRESSION: Interstitial edema superimposed on chronic interstitial lung disease. Electronically Signed   By: Genevive Bi M.D.   On: 11/04/2016 18:46    Scheduled Meds: . aspirin EC  81 mg Oral Daily  . atorvastatin  40 mg Oral q1800  . ciprofloxacin  500 mg Oral BID  . clopidogrel  75 mg Oral Daily  . enalapril  5 mg Oral QHS  . furosemide  60 mg Intravenous Q12H  . potassium chloride  40 mEq Oral BID  . rivaroxaban  20 mg Oral Q supper  . sodium chloride flush  3 mL Intravenous Q12H  . spironolactone  25 mg Oral Daily  . tamsulosin  0.4 mg Oral QHS   Continuous Infusions:   LOS: 1 day   Chez Bulnes, MD FACP Hospitalist.   If 7PM-7AM, please contact night-coverage [email or web address] Password [facility] 11/05/2016, 4:43 PM

## 2016-11-06 ENCOUNTER — Inpatient Hospital Stay (HOSPITAL_COMMUNITY): Payer: Medicare Other

## 2016-11-06 DIAGNOSIS — I481 Persistent atrial fibrillation: Secondary | ICD-10-CM

## 2016-11-06 LAB — BASIC METABOLIC PANEL
Anion gap: 7 (ref 5–15)
BUN: 25 mg/dL — AB (ref 6–20)
CHLORIDE: 108 mmol/L (ref 101–111)
CO2: 26 mmol/L (ref 22–32)
CREATININE: 1.14 mg/dL (ref 0.61–1.24)
Calcium: 8 mg/dL — ABNORMAL LOW (ref 8.9–10.3)
GFR calc non Af Amer: 56 mL/min — ABNORMAL LOW (ref >60)
Glucose, Bld: 102 mg/dL — ABNORMAL HIGH (ref 65–99)
POTASSIUM: 4.3 mmol/L (ref 3.5–5.1)
Sodium: 141 mmol/L (ref 135–145)

## 2016-11-06 NOTE — Care Management Note (Signed)
Case Management Note  Patient Details  Name: Christopher [name] MRN: [id number] Date of Birth: [birth year]  Subjective/Objective:                  Pt admitted with CHF. Pt is from home, lives with his nephew. Pt is ind with ADL's. He uses a cane with ambulation and has a walker and BSC.  Pt has chronic indwelling foley. [facility]. Therisa DoyneKathy Cheek, of [facility] made aware of admission. Pt also has a lady who helps with house work and managing medications. Pt started on Xeralto and [facility] [place].  Action/Plan: Pt plans to return home with res[facility]. Pt will need order to resume HH services at discharge. Pt given voucher for 30-days free xeralto and instructed to get [facility] as they have the xeralto starter packs. CM will cont to follow.   Expected Discharge Date:     11/07/2016             Expected Discharge Plan:  Home w Home Health Services  In-House Referral:  NA  Discharge planning Services  CM Consult  Post Acute Care Choice:  Home Health, Resumption of Svcs/PTA Provider Choice offered to:  Patient  HH Arranged:  RN, [facility]  [facility]  Status of Service:  In process, will continue to follow   Malcolm MetroChildress, Darlys Buis Demske, RN 11/06/2016, 2:50 PM

## 2016-11-06 NOTE — Evaluation (Signed)
Physical Therapy Evaluation Patient Details Name: Christopher [name] MRN: [id number] DOB: [birth year] Today's Date: 11/06/2016   History of Present Illness  83 y.o. male with a history of hypertension, coronary artery disease status post PCI in 2014, BPH, abdominal aneurysm about 6.3 cm infrarenal aneurysm. He also has a history of CHF with an EF of 5-50% on 07/10/2013. The patient recently had a left inguinal hernia repair and at [facility] approximately 3 weeks ago. On the same time, the patient ran out of his diuretics: Both his Lasix and his spironolactone. He attempted to call his cardiologist, who didn't prescribing his medications, but they declined to refill his medications as he has not been seen for more than 2 years. Over the next 3 weeks, his legs and arms have been swollen. He has been having increasing shortness of breath especially with any exertion. He does have a cough with white sputum production and orthopnea. He denies chest pain, palpitations, irregular heartbeat. He has never been told he has irregular heartbeat in the past    Clinical Impression  Pt received in bed, and was agreeable to PT evaluation.  He states that he normally uses his rollator walker, and is able to ambulate short community distances.  He is normally independent with dressing and bathing.  During PT evaluation he required Min A for gait x 1820ft with RW with distance limited due to fatigue and some dizziness.  He is recommended to return home with HHPT and continued 24/7 supervision/assistance.      Follow Up Recommendations Home health PT;Supervision/Assistance - 24 hour    Equipment Recommendations  None recommended by PT    Recommendations for Other Services       Precautions / Restrictions        Mobility  Bed Mobility Overal bed mobility: Modified Independent                Transfers Overall transfer level: Modified independent Equipment used: Rolling walker (2 wheeled)                 Ambulation/Gait Ambulation/Gait assistance: Min assist Ambulation Distance (Feet): 20 Feet Assistive device: Rolling walker (2 wheeled) Gait Pattern/deviations: Step-to pattern;Wide base of support;Trunk flexed   Gait velocity interpretation: <1.8 ft/sec, indicative of risk for recurrent falls General Gait Details: Pt demonstrates increased toe out position on the R foot and needs cues to keep feet inside the base of the RW.  Further gait distance was limited due to pt c/o becomming swimmy headed.    Stairs            Wheelchair Mobility    Modified Rankin (Stroke Patients Only)       Balance Overall balance assessment: Needs assistance Sitting-balance support: Bilateral upper extremity supported;Feet supported Sitting balance-Leahy Scale: Good     Standing balance support: Bilateral upper extremity supported Standing balance-Leahy Scale: Fair                               Pertinent Vitals/Pain Pain Assessment: No/denies pain    Home Living   Living Arrangements: Other relatives (Pt's nephew lives with him.  Nephew works during the day. ) Available Help at Discharge: Home health;Personal care attendant (Pt has a lady that sits with him during the day, and had also started HHPT 2x's/wk after the [facility]. ) Type of Home: House Home Access: Ramped entrance     Home Layout:  One level Home Equipment: Walker - 4 wheels;Cane - single point;Bedside commode;Shower seat;Wheelchair - manual      Prior Function     Gait / Transfers Assistance Needed: Pt states that he is ambulatory with his rollator most of the time.  Before the Inguinal surgery repair at [facility] he states he was ambulating limited community distances.  He was able to get to the grocery store, but either used an electric cart, or did not walk very far.    ADL's / Homemaking Assistance Needed: Pt is independent with dressing and bathing.         Hand  Dominance        Extremity/Trunk Assessment   Upper Extremity Assessment Upper Extremity Assessment: Overall WFL for tasks assessed    Lower Extremity Assessment Lower Extremity Assessment: Generalized weakness       Communication   Communication: HOH  Cognition Arousal/Alertness: Awake/alert Behavior During Therapy: WFL for tasks assessed/performed Overall Cognitive Status: Within Functional Limits for tasks assessed                      General Comments      Exercises     Assessment/Plan    PT Assessment Patient needs continued PT services  PT Problem List Decreased strength;Decreased activity tolerance;Decreased balance;Decreased mobility;Decreased cognition       PT Treatment Interventions DME instruction;Gait training;Functional mobility training;Therapeutic activities;Therapeutic exercise;Balance training;Patient/family education    PT Goals (Current goals can be found in the Care Plan section)  Acute Rehab PT Goals Patient Stated Goal: To go back home PT Goal Formulation: With patient/family Time For Goal Achievement: 11/13/16 Potential to Achieve Goals: Good    Frequency Min 3X/week   Barriers to discharge        Co-evaluation               End of Session Equipment Utilized During Treatment: Gait belt Activity Tolerance: Patient limited by fatigue Patient left: in chair;with call bell/phone within reach;with family/visitor present Nurse Communication: Mobility status (Mobility sheet left hanging in the room. ) PT Visit Diagnosis: Muscle weakness (generalized) (M62.81);Other abnormalities of gait and mobility (R26.89)    Functional Assessment Tool Used: AM-PAC 6 Clicks Basic Mobility;Clinical judgement Functional Limitation: Mobility: Walking and moving around Mobility: Walking and Moving Around Current Status (Z6109): At least 20 percent but less than 40 percent impaired, limited or restricted Mobility: Walking and Moving Around Goal  Status [phone]): At least 1 percent but less than 20 percent impaired, limited or restricted    Time: 1127-1150 PT Time Calculation (min) (ACUTE ONLY): 23 min   Charges:   PT Evaluation $PT Eval Low Complexity: 1 Procedure PT Treatments $Gait Training: 8-22 mins   PT G Codes:   PT G-Codes **NOT FOR INPATIENT CLASS** Functional Assessment Tool Used: AM-PAC 6 Clicks Basic Mobility;Clinical judgement Functional Limitation: Mobility: Walking and moving around Mobility: Walking and Moving Around Current Status (U9811): At least 20 percent but less than 40 percent impaired, limited or restricted Mobility: Walking and Moving Around Goal Status [phone]): At least 1 percent but less than 20 percent impaired, limited or restricted     Beth Joyel Chenette, PT, DPT X: [phone]

## 2016-11-06 NOTE — Progress Notes (Addendum)
PROGRESS NOTE    Christopher MarekWilbert J [name]  ZOX:[id number] DOB: 1928/03/19 DOA: 11/04/2016 PCP: Lubertha SouthSteve Luking, MD    Brief Narrative: Brief Narrative: 83 yo with hx HTN, CAD s/p PCI, AAA at 6.3cm, CHF, ran out of diuretics, but couldn't get it filled as he has been non compliant with office follow up, admitted for anasarca.  He was found to have afib, and was in pulmonary edema.  He felt better with IV Diuresis, along with started on Xarelto, and he was Tx with Cipro for a UTI. He is feeling a little better, swelling are down, but he still has bilateral rales. He had some confusion last night, sounded like sundowning.    Assessment & Plan:   Principal Problem:   Acute on chronic systolic heart failure (HCC) Active Problems:   UTI (urinary tract infection)   Chronic coronary artery disease   HTN (hypertension)   AAA (abdominal aortic aneurysm) without rupture (HCC)   Elevated troponin   Atrial fibrillation (HCC)   H/O inguinal hernia repair    Diastolic CHF:   Will continue with IV Lasix, decrease dose as well.  Follow I/O and follow Cr.  UTI:  Continue with oral Cipro.  HTN:  Continue with meds.  Afib:  Xarelto restarted.  Continue with meds.  Rate is controlled.   AAA:  Will need follow up with vascular surgery. Obtain Abdominal US to reassess size.     Antimicrobials: Anti-infectives    Start     Dose/Rate Route Frequency Ordered Stop   11/04/16 2100  ciprofloxacin (CIPRO) tablet 500 mg     500 mg Oral 2 times daily 11/04/16 2021         Subjective:   Feeling much better.   Objective: Vitals:   11/05/16 0645 11/05/16 1150 11/05/16 1500 11/06/16 0624  BP: (!) 145/72  (!) 152/65 (!) 145/60  Pulse:      Resp: 17  18 15   Temp: 97.5 F (36.4 C)  97.6 F (36.4 C) 97.4 F (36.3 C)  TempSrc: Oral  Oral Oral  SpO2: 100%  98% 100%  Weight: 87.6 kg (193 lb 2 oz) 83.3 kg (183 lb 11.2 oz)  81.7 kg (180 lb 1.9 oz)  Height:        Intake/Output Summary (Last 24 hours) at  11/06/16 1402 Last data filed at 11/05/16 2000  Gross per 24 hour  Intake              240 ml  Output             2300 ml  Net            -2060 ml   Filed Weights   11/05/16 0645 11/05/16 1150 11/06/16 0624  Weight: 87.6 kg (193 lb 2 oz) 83.3 kg (183 lb 11.2 oz) 81.7 kg (180 lb 1.9 oz)    Examination:  General exam: Appears calm and comfortable  Respiratory system:  He still has bilateral rales, about 1/3 way up.  Respiratory effort normal. Cardiovascular system: S1 & S2 heard, RRR. No JVD, murmurs, rubs, gallops or clicks. No pedal edema. Gastrointestinal system: Abdomen is nondistended, soft and nontender. No organomegaly or masses felt. Normal bowel sounds heard. Central nervous system: Alert and oriented. No focal neurological deficits. Extremities: Symmetric 5 x 5 power. Skin: No rashes, lesions or ulcers Psychiatry: Judgement and insight appear normal. Mood & affect appropriate.   Data Reviewed: I have personally reviewed following labs and imaging studies  CBC:  Recent  Labs Lab 11/04/16 1736  WBC 11.5*  NEUTROABS 8.2*  HGB 10.2*  HCT 33.0*  MCV 78.0  PLT 332   Basic Metabolic Panel:  Recent Labs Lab 11/04/16 1736 11/05/16 0431 11/06/16 0447  NA 136 141 141  K 4.2 4.0 4.3  CL 106 109 108  CO2 22 25 26   GLUCOSE 106* 89 102*  BUN 33* 30* 25*  CREATININE 1.20 1.19 1.14  CALCIUM 8.1* 7.8* 8.0*   GFR: Estimated Creatinine Clearance: 46.5 mL/min (by C-G formula based on SCr of 1.14 mg/dL). Liver Function Tests:  Recent Labs Lab 11/04/16 1736  AST 21  ALT 17  ALKPHOS 56  BILITOT 0.6  PROT 6.2*  ALBUMIN 2.5*   Cardiac Enzymes:  Recent Labs Lab 11/04/16 1736 11/04/16 2122 11/05/16 0431 11/05/16 0846  TROPONINI 0.22* 0.23* 0.21* 0.22*   Thyroid Function Tests:  Recent Labs  11/04/16 2122  TSH 0.613   Radiology Studies: Dg Chest 2 View  Result Date: 11/04/2016 CLINICAL DATA:  Short of breath, swelling in extremities and eyelids. EXAM:  CHEST  2 VIEW COMPARISON:  10/16/2016 FINDINGS: Normal cardiac silhouette. There is mild interstitial edema pattern similar to comparison exam. This interstitial pattern is superimposed on chronic interstitial lung disease. No effusion, infiltrate pneumothorax. IMPRESSION: Interstitial edema superimposed on chronic interstitial lung disease. Electronically Signed   By: Genevive Bi M.D.   On: 11/04/2016 18:46    Scheduled Meds: . aspirin EC  81 mg Oral Daily  . atorvastatin  40 mg Oral q1800  . ciprofloxacin  500 mg Oral BID  . clopidogrel  75 mg Oral Daily  . enalapril  5 mg Oral QHS  . furosemide  60 mg Intravenous Q12H  . potassium chloride  40 mEq Oral BID  . rivaroxaban  20 mg Oral Q supper  . sodium chloride flush  3 mL Intravenous Q12H  . spironolactone  25 mg Oral Daily  . tamsulosin  0.4 mg Oral QHS   Continuous Infusions:   LOS: 2 days   Tanza Pellot, MD FACP Hospitalist.   If 7PM-7AM, please contact night-coverage [email or web address] Password [email or web address] 11/06/2016, 2:02 PM

## 2016-11-06 NOTE — Progress Notes (Signed)
Patient becomes disoriented during night.  Patient has pulled on foley several times during the night, which resulting in breaking the seal on foley. Foley bag replaced,  Patient has needed to be reoriented several times during shift.

## 2016-11-07 LAB — CBC
HEMATOCRIT: 36.8 % — AB (ref 39.0–52.0)
Hemoglobin: 11.4 g/dL — ABNORMAL LOW (ref 13.0–17.0)
MCH: 23.8 pg — AB (ref 26.0–34.0)
MCHC: 31 g/dL (ref 30.0–36.0)
MCV: 77 fL — AB (ref 78.0–100.0)
PLATELETS: 326 10*3/uL (ref 150–400)
RBC: 4.78 MIL/uL (ref 4.22–5.81)
RDW: 19.3 % — ABNORMAL HIGH (ref 11.5–15.5)
WBC: 13.7 10*3/uL — ABNORMAL HIGH (ref 4.0–10.5)

## 2016-11-07 LAB — TYPE AND SCREEN
ABO/RH(D): A POS
Antibody Screen: NEGATIVE

## 2016-11-07 LAB — BASIC METABOLIC PANEL
Anion gap: 10 (ref 5–15)
BUN: 22 mg/dL — ABNORMAL HIGH (ref 6–20)
CHLORIDE: 104 mmol/L (ref 101–111)
CO2: 26 mmol/L (ref 22–32)
CREATININE: 1.31 mg/dL — AB (ref 0.61–1.24)
Calcium: 8.7 mg/dL — ABNORMAL LOW (ref 8.9–10.3)
GFR calc Af Amer: 55 mL/min — ABNORMAL LOW (ref >60)
GFR calc non Af Amer: 48 mL/min — ABNORMAL LOW (ref >60)
Glucose, Bld: 123 mg/dL — ABNORMAL HIGH (ref 65–99)
POTASSIUM: 4.6 mmol/L (ref 3.5–5.1)
Sodium: 140 mmol/L (ref 135–145)

## 2016-11-07 MED ORDER — PRAMOXINE HCL 1 % RE FOAM
Freq: Three times a day (TID) | RECTAL | Status: DC | PRN
  Administered 2016-11-08: 09:00:00 via RECTAL
  Filled 2016-11-07: qty 15

## 2016-11-07 MED ORDER — RIVAROXABAN 15 MG PO TABS: 15.0000 mg | ORAL_TABLET | Freq: Every day | ORAL | Status: DC

## 2016-11-07 NOTE — Care Management Important Message (Signed)
Important Message  Patient Details  Name: Christopher MarekWilbert J Sollenberger MRN: [id number] Date of Birth: 10/19/1927   Medicare Important Message Given:  Yes    Malcolm MetroChildress, Odie Rauen Demske, RN 11/07/2016, 12:16 PM

## 2016-11-07 NOTE — Progress Notes (Addendum)
PROGRESS NOTE    Christopher [name]  ZOX:[id number] DOB: [birth year] DOA: 11/04/2016 PCP: Lubertha South, MD    Brief Narrative: 83 yo with hx HTN, CAD s/p PCI, AAA at 6.3cm, CHF, ran out of diuretics, but couldn't get it filled as he has been non compliant with office follow up, admitted for anasarca.  He was found to have afib, and was in pulmonary edema.  He felt better with IV Diuresis, along with started on Xarelto, and he was Tx with Cipro for a UTI.  He also had an episode of rectal bleeding, likely from hemorrhoids, and his Xarelto was discontinued.  Reviewed of his record showed that he was evaluated by Vascular surgery for his AAA, and planned for follow up in a couple of weeks for decision to be made about endovascular repair.  He has hx of intermittent confusion, and his debility making medical decision more difficult.  His POA is his nephrew.   Assessment & Plan:   Principal Problem:   Acute on chronic systolic heart failure (HCC) Active Problems:   UTI (urinary tract infection)   Chronic coronary artery disease   HTN (hypertension)   AAA (abdominal aortic aneurysm) without rupture (HCC)   Elevated troponin   Atrial fibrillation (HCC)   H/O inguinal hernia repair   Diastolic CHF:   Will continue with IV Lasix, decrease dose as well.  Follow I/O and follow Cr. Some of his crackles may be due to pulmonary fibrosis.   Rectal bleeding:  Xarelto stopped.  Suspect from hemorrhoids.  Follow Hct.  Tx with anusol.   UTI:  Continue with oral Cipro.  HTN:  Continue with meds.  Afib:  His Xarelto has been stopped.  He has too much bleeding risks.  This is in consideration for his AAA which may need to be repaired as well.   AAA:  Will need follow up with vascular surgery. AAA size is 6.4 cm.   DVT prophylaxis: SCD.   Code Status: DNR.  Family Communication: friend and caretaker at bedside.  Disposition Plan: TBD.   Consultants:   None.   Procedures: None.    Antimicrobials: Anti-infectives    Start     Dose/Rate Route Frequency Ordered Stop   11/04/16 2100  ciprofloxacin (CIPRO) tablet 500 mg     500 mg Oral 2 times daily 11/04/16 2021         Subjective:  He has no complaints.   Objective: Vitals:   11/06/16 1517 11/06/16 2125 11/07/16 0300 11/07/16 1401  BP: (!) 144/52 (!) 135/54 (!) 145/50 (!) 148/70  Pulse: 73 65 70 91  Resp: 16 15 18 20   Temp: 98.1 F (36.7 C) 97.9 F (36.6 C) 98 F (36.7 C) 97.9 F (36.6 C)  TempSrc:  Oral Oral Oral  SpO2: 98% 98% 100% 96%  Weight:   79.1 kg (174 lb 4.8 oz)   Height:        Intake/Output Summary (Last 24 hours) at 11/07/16 1813 Last data filed at 11/07/16 1516  Gross per 24 hour  Intake              483 ml  Output             5125 ml  Net            -4642 ml   Filed Weights   11/05/16 1150 11/06/16 0624 11/07/16 0300  Weight: 83.3 kg (183 lb 11.2 oz) 81.7 kg (180 lb 1.9 oz) 79.1 kg (174  lb 4.8 oz)    Examination:  General exam: Appears calm and comfortable  Respiratory system: Clear to auscultation. Respiratory effort normal. Cardiovascular system: S1 & S2 heard, RRR. No JVD, murmurs, rubs, gallops or clicks. No pedal edema. Gastrointestinal system: Abdomen is nondistended, soft and nontender. No organomegaly or masses felt. Normal bowel sounds heard. Central nervous system: Alert and oriented. No focal neurological deficits. Extremities: Symmetric 5 x 5 power. Skin: No rashes, lesions or ulcers Psychiatry: Judgement and insight appear normal. Mood & affect appropriate.   Data Reviewed: I have personally reviewed following labs and imaging studies  CBC:  Recent Labs Lab 11/04/16 1736 11/07/16 1541  WBC 11.5* 13.7*  NEUTROABS 8.2*  --   HGB 10.2* 11.4*  HCT 33.0* 36.8*  MCV 78.0 77.0*  PLT 332 326   Basic Metabolic Panel:  Recent Labs Lab 11/04/16 1736 11/05/16 0431 11/06/16 0447 11/07/16 0504  NA 136 141 141 140  K 4.2 4.0 4.3 4.6  CL 106 109 108 104   CO2 22 25 26 26   GLUCOSE 106* 89 102* 123*  BUN 33* 30* 25* 22*  CREATININE 1.20 1.19 1.14 1.31*  CALCIUM 8.1* 7.8* 8.0* 8.7*   GFR: Estimated Creatinine Clearance: 40.5 mL/min (A) (by C-G formula based on SCr of 1.31 mg/dL (H)). Liver Function Tests:  Recent Labs Lab 11/04/16 1736  AST 21  ALT 17  ALKPHOS 56  BILITOT 0.6  PROT 6.2*  ALBUMIN 2.5*   Cardiac Enzymes:  Recent Labs Lab 11/04/16 1736 11/04/16 2122 11/05/16 0431 11/05/16 0846  TROPONINI 0.22* 0.23* 0.21* 0.22*  Thyroid Function Tests:  Recent Labs  11/04/16 2122  TSH 0.613   Radiology Studies: [place] Aorta  Result Date: 11/06/2016 CLINICAL DATA:  Abdominal aortic aneurysm. EXAM: ULTRASOUND OF ABDOMINAL AORTA TECHNIQUE: Ultrasound examination of the abdominal aorta was performed to evaluate for abdominal aortic aneurysm. COMPARISON:  CT scan of November 01, 2016. FINDINGS: Abdominal Aorta Infrarenal fusiform abdominal aortic aneurysm is noted with maximum measured transverse diameter of 6.4 cm. Maximum Diameter: 6.4 cm distally. IMPRESSION: 6.4 cm infrarenal abdominal aortic aneurysm. Due to the increased risk of rupture for abdominal aortic aneurysms of this size, vascular surgery consultation is recommended. This recommendation follows ACR consensus guidelines: White Paper of the ACR Incidental Findings Committee II on Vascular Findings. J Am Coll Radiol 2013; 10:789-794. These results will be called to the ordering clinician or representative by the Radiologist Assistant, and communication documented in the PACS or zVision Dashboard. Electronically Signed   By: Lupita RaiderJames  Green Jr, M.D.   On: 11/06/2016 16:36    Scheduled Meds: . aspirin EC  81 mg Oral Daily  . atorvastatin  40 mg Oral q1800  . ciprofloxacin  500 mg Oral BID  . clopidogrel  75 mg Oral Daily  . enalapril  5 mg Oral QHS  . furosemide  60 mg Intravenous Q12H  . potassium chloride  40 mEq Oral BID  . sodium chloride flush  3 mL Intravenous Q12H  .  spironolactone  25 mg Oral Daily  . tamsulosin  0.4 mg Oral QHS   Continuous Infusions:   LOS: 3 days   Shama Monfils, MD FACP Hospitalist.   If 7PM-7AM, please contact night-coverage [email or web address] Password [email or web address] 11/07/2016, 6:13 PM

## 2016-11-07 NOTE — Progress Notes (Signed)
Physical Therapy Treatment Patient Details Name: Christopher [name] MRN: [id number] DOB: [birth year] Today's Date: 11/07/2016    History of Present Illness 83 y.o. male with a history of hypertension, coronary artery disease status post PCI in 2014, BPH, abdominal aneurysm about 6.3 cm infrarenal aneurysm. He also has a history of CHF with an EF of 5-50% on 07/10/2013. The patient recently had a left inguinal hernia repair and [facility] 3 weeks ago. On the same time, the patient ran out of his diuretics: Both his Lasix and his spironolactone. He attempted to call his cardiologist, who didn't prescribing his medications, but they declined to refill his medications as he has not been seen for more than 2 years. Over the next 3 weeks, his legs and arms have been swollen. He has been having increasing shortness of breath especially with any exertion. He does have a cough with white sputum production and orthopnea. He denies chest pain, palpitations, irregular heartbeat. He has never been told he has irregular heartbeat in the past    PT Comments    Pt received in bed, and was agreeable to PT tx, however slightly confused today.  He required Min A for sit<>stand transfer.  Upon standing, pt expressed need to have a BM, therefore [facility]  Pt was able to have a small brown BM, but during peri-care afterwards, he began to bleed - steady stream from rectum.  Kendal HymenBonnie, RN called and came into to assist.  Pt became pale, and expressed feeling dizzy.  Pt was assisted back into the bed, and vitals were WNL.  Dr. Conley RollsLe was notified due to the large amount of blood loss.  Further PT tx was limited due to continued heavy bleeding. Shanda BumpsJessica, RN later confirmed that it was a hemorrhoid.  Continue to recommend HHPT with 24/7 supervision/assistance at this time.     Follow Up Recommendations  Home health PT;Supervision/Assistance - 24 hour     Equipment Recommendations  None  recommended by PT    Recommendations for Other Services       Precautions / Restrictions Precautions Precautions: Fall Precaution Comments: Due to immobility Restrictions Weight Bearing Restrictions: No    Mobility  Bed Mobility Overal bed mobility: Needs Assistance Bed Mobility: Supine to Sit     Supine to sit: Min assist;HOB elevated     General bed mobility comments: increased time.   Transfers Overall transfer level: Needs assistance Equipment used: Rolling walker (2 wheeled) Transfers: Sit to/from Stand Sit to Stand: Min assist         General transfer comment: Once pt stood up he expressed need to have a BM.  Therefore, PT had pt sit back down on the EOB, and BSC brought up next to the pt, he was [facility] and had a small brown BM.  PT was assisting with peri-hygiene after BM when pt started bleeding with a steady small stream of blood that looked as if it was coming from his rectal area.  Kendal HymenBonnie, RN called, pt expressed feeling dizzy, and pt was assisted back to bed.  Vitals obtained and were WNL.  Kendal HymenBonnie, RN notified Dr. Conley RollsLe due to the large amount of blood that was lost. Shanda BumpsJessica, RN later confirmed it was a burst hemorrhoid.    Ambulation/Gait                 Stairs            Wheelchair  Mobility    Modified Rankin (Stroke Patients Only)       Balance Overall balance assessment: Needs assistance Sitting-balance support: Bilateral upper extremity supported;Feet supported Sitting balance-Leahy Scale: Good     Standing balance support: Bilateral upper extremity supported Standing balance-Leahy Scale: Fair                      Cognition Arousal/Alertness: Awake/alert Behavior During Therapy: WFL for tasks assessed/performed                   General Comments: Pt seems more confused today.  Asking PT if he can go home in a little bit so he can pay some bills, and then come back later.      Exercises       General Comments        Pertinent Vitals/Pain Pain Assessment: No/denies pain    Home Living                      Prior Function            PT Goals (current goals can now be found in the care plan section) Acute Rehab PT Goals Patient Stated Goal: To go back home PT Goal Formulation: With patient/family Time For Goal Achievement: 11/13/16 Potential to Achieve Goals: Good Progress towards PT goals: Progressing toward goals    Frequency    Min 3X/week      PT Plan Current plan remains appropriate    Co-evaluation             End of Session Equipment Utilized During Treatment: Gait belt Activity Tolerance: Patient limited by fatigue Patient left: in bed;with call bell/phone within reach;with nursing/sitter in room Nurse Communication: Mobility status PT Visit Diagnosis: Muscle weakness (generalized) (M62.81);Other abnormalities of gait and mobility (R26.89)     Time: 1610-9604 PT Time Calculation (min) (ACUTE ONLY): 38 min  Charges:  $Therapeutic Activity: 38-52 mins                    G Codes:       Beth Katlin Bortner, PT, DPT X: [phone]

## 2016-11-07 NOTE — Progress Notes (Signed)
Pt with moderate amount of BRBPR after having a soft BM. Internal hemorrhoid visualized. Dr. Conley RollsLe paged and made aware. Pt's VSS. New orders received will continue to monitor.

## 2016-11-07 NOTE — Discharge Instructions (Signed)

## 2016-11-08 ENCOUNTER — Other Ambulatory Visit (HOSPITAL_COMMUNITY): Payer: Self-pay | Admitting: Cardiology

## 2016-11-08 DIAGNOSIS — I5023 Acute on chronic systolic (congestive) heart failure: Secondary | ICD-10-CM

## 2016-11-08 DIAGNOSIS — I714 Abdominal aortic aneurysm, without rupture: Secondary | ICD-10-CM

## 2016-11-08 LAB — BASIC METABOLIC PANEL
ANION GAP: 10 (ref 5–15)
BUN: 26 mg/dL — AB (ref 6–20)
CALCIUM: 9 mg/dL (ref 8.9–10.3)
CO2: 25 mmol/L (ref 22–32)
Chloride: 103 mmol/L (ref 101–111)
Creatinine, Ser: 1.45 mg/dL — ABNORMAL HIGH (ref 0.61–1.24)
GFR calc Af Amer: 49 mL/min — ABNORMAL LOW (ref >60)
GFR, EST NON AFRICAN AMERICAN: 42 mL/min — AB (ref >60)
Glucose, Bld: 134 mg/dL — ABNORMAL HIGH (ref 65–99)
Potassium: 4.7 mmol/L (ref 3.5–5.1)
SODIUM: 138 mmol/L (ref 135–145)

## 2016-11-08 LAB — HEMOGLOBIN AND HEMATOCRIT, BLOOD
HCT: 39.5 % (ref 39.0–52.0)
HEMOGLOBIN: 12 g/dL — AB (ref 13.0–17.0)

## 2016-11-08 LAB — CBC
HCT: 37.5 % — ABNORMAL LOW (ref 39.0–52.0)
Hemoglobin: 11.6 g/dL — ABNORMAL LOW (ref 13.0–17.0)
MCH: 23.8 pg — ABNORMAL LOW (ref 26.0–34.0)
MCHC: 30.9 g/dL (ref 30.0–36.0)
MCV: 77 fL — ABNORMAL LOW (ref 78.0–100.0)
PLATELETS: 348 10*3/uL (ref 150–400)
RBC: 4.87 MIL/uL (ref 4.22–5.81)
RDW: 19.5 % — AB (ref 11.5–15.5)
WBC: 14 10*3/uL — ABNORMAL HIGH (ref 4.0–10.5)

## 2016-11-08 MED ORDER — POTASSIUM CHLORIDE CRYS ER 20 MEQ PO TBCR: 20.0000 meq | EXTENDED_RELEASE_TABLET | Freq: Every day | ORAL | 2 refills | Status: DC

## 2016-11-08 MED ORDER — SPIRONOLACTONE 25 MG PO TABS: 25.0000 mg | ORAL_TABLET | Freq: Every day | ORAL | 3 refills | Status: DC

## 2016-11-08 MED ORDER — ENALAPRIL MALEATE 5 MG PO TABS: 5.0000 mg | ORAL_TABLET | Freq: Every day | ORAL | 2 refills | Status: DC

## 2016-11-08 MED ORDER — CLOPIDOGREL BISULFATE 75 MG PO TABS
75.0000 mg | ORAL_TABLET | Freq: Every day | ORAL | 3 refills | Status: DC
Start: 2016-11-08 — End: 2016-11-08

## 2016-11-08 MED ORDER — TAMSULOSIN HCL 0.4 MG PO CAPS: 0.4000 mg | ORAL_CAPSULE | Freq: Every day | ORAL | 2 refills | Status: AC

## 2016-11-08 MED ORDER — ENALAPRIL MALEATE 5 MG PO TABS: 5.0000 mg | ORAL_TABLET | Freq: Every day | ORAL | 3 refills | Status: DC

## 2016-11-08 MED ORDER — FUROSEMIDE 40 MG PO TABS: 40.0000 mg | ORAL_TABLET | Freq: Two times a day (BID) | ORAL | 2 refills | Status: DC

## 2016-11-08 MED ORDER — SPIRONOLACTONE 25 MG PO TABS: 25.0000 mg | ORAL_TABLET | Freq: Every day | ORAL | 2 refills | Status: DC

## 2016-11-08 MED ORDER — FUROSEMIDE 40 MG PO TABS: 40.0000 mg | ORAL_TABLET | Freq: Every day | ORAL | 3 refills | Status: DC

## 2016-11-08 MED ORDER — CLOPIDOGREL BISULFATE 75 MG PO TABS: 75.0000 mg | ORAL_TABLET | Freq: Every day | ORAL | 2 refills | Status: AC

## 2016-11-08 MED ORDER — METOPROLOL SUCCINATE ER 25 MG PO TB24: 25.0000 mg | ORAL_TABLET | Freq: Every day | ORAL | 2 refills | Status: DC

## 2016-11-08 MED ORDER — ASPIRIN 81 MG PO TBEC: 81.0000 mg | DELAYED_RELEASE_TABLET | Freq: Every day | ORAL | Status: AC

## 2016-11-08 NOTE — Care Management Note (Addendum)
Case Management Note  Patient Details  Name: Lorayne MarekWilbert J Longmore MRN: [id number] Date of Birth: 12/12/1927   Expected Discharge Date:  11/08/16               Expected Discharge Plan:  Home w Home Health Services  In-House Referral:  NA  Discharge planning Services  CM Consult  Post Acute Care Choice:  Home Health, Resumption of Svcs/PTA Provider Choice offered to:  Patient  HH Arranged:  RN, PT HH Agency:  [facility]  Status of Service:  Completed, signed off  Additional Comments: Pt discharging home today with resumption of HH services. Pt aware HH has 48 hrs to resume services. Therisa DoyneKathy Cheek, of [facility], aware of DC and will obtain pt info from chart. No longer discharging on Xeralto. Malcolm Metrohildress, Ledonna Dormer Demske, RN 11/08/2016, 1:20 PM

## 2016-11-08 NOTE — Discharge Summary (Signed)
Physician Discharge Summary  Christopher [name] ZOX:[id number] DOB: 1928/03/01 DOA: 11/04/2016  PCP: Lubertha South, MD  Admit date: 11/04/2016 Discharge date: 11/08/2016  Time spent: 45 minutes  Recommendations for Outpatient Follow-up:  -Will be discharged home today. -Follow ups scheduled with cardiology and vascular surgery.  Discharge Diagnoses:  Principal Problem:   Acute on chronic systolic heart failure (HCC) Active Problems:   UTI (urinary tract infection)   Chronic coronary artery disease   HTN (hypertension)   AAA (abdominal aortic aneurysm) without rupture (HCC)   Elevated troponin   Atrial fibrillation (HCC)   H/O inguinal hernia repair   Discharge Condition: Stable and improved  Filed Weights   11/06/16 0624 11/07/16 0300 11/08/16 0500  Weight: 81.7 kg (180 lb 1.9 oz) 79.1 kg (174 lb 4.8 oz) 74.7 kg (164 lb 11.2 oz)    History of present illness:  As per Dr. Adrian Blackwater on 3/17:  Christopher [name] is a 83 y.o. male with a history of hypertension, coronary artery disease status post PCI in 2014, BPH, abdominal aneurysm about 6.3 cm infrarenal aneurysm. He also has a history of CHF with an EF of 5-50% on 07/10/2013. The patient recently had a left inguinal hernia repair and at [facility] approximately 3 weeks ago. On the same time, the patient ran out of his diuretics: Both his Lasix and his spironolactone. He attempted to call his cardiologist, who didn't prescribing his medications, but they declined to refill his medications as he has not been seen for more than 2 years. Over the next 3 weeks, his legs and arms have been swollen. He has been having increasing shortness of breath especially with any exertion. He does have a cough with white sputum production and orthopnea. He denies chest pain, palpitations, irregular heartbeat. He has never been told he has irregular heartbeat in the past.  The surgery, the patient has had difficult time ambulating. Additionally, he  has had urinary engine and has a Foley catheter in place. He was recently diagnosed with a UTI and was placed on cefuroxime, although he developed a rash to this and was switched to ciprofloxacin to start today.  Emergency Department Course: Chest x-ray shows mild vascular congestion with mild pulmonary edema. His BNP is elevated and his troponins elevated to 0.22. ER called the cardiologist's, however the cardiologist on call deferred the admission to the hospitalist service.  Hospital Course:   Acute on chronic diastolic CHF -Has diuresed 16 L since admission, he is now euvolemic. -Has been started on ACE inhibitor and beta blocker. -Have advised outpatient follow-up with cardiology in one week. Appointment has been scheduled.  AAA -Size is 6.4 cm, follow-up with vascular surgery has been recommended and scheduled.  Procedures:  None   Consultations:  None  Discharge Instructions  Discharge Instructions    Diet - low sodium heart healthy    Complete by:  As directed    Increase activity slowly    Complete by:  As directed      Allergies as of 11/08/2016      Reactions   Cefuroxime Axetil Hives, Itching      Medication List    STOP taking these medications   ciprofloxacin 500 MG tablet Commonly known as:  CIPRO     TAKE these medications   aspirin 81 MG EC tablet Take 1 tablet (81 mg total) by mouth daily. Start taking on:  11/09/2016   atorvastatin 40 MG tablet Commonly known as:  LIPITOR Take  40 mg by mouth daily.   cholecalciferol 1000 units tablet Commonly known as:  VITAMIN D Take 1,000 Units by mouth daily.   clopidogrel 75 MG tablet Commonly known as:  PLAVIX Take 1 tablet (75 mg total) by mouth daily.   enalapril 5 MG tablet Commonly known as:  VASOTEC Take 1 tablet (5 mg total) by mouth at bedtime. What changed:  See the new instructions.   furosemide 40 MG tablet Commonly known as:  LASIX Take 1 tablet (40 mg total) by mouth 2 (two) times  daily.   meclizine 12.5 MG tablet Commonly known as:  ANTIVERT Take 12.5 mg by mouth 3 (three) times daily as needed for dizziness.   metoprolol succinate 25 MG 24 hr tablet Commonly known as:  TOPROL-XL Take 1 tablet (25 mg total) by mouth daily.   potassium chloride SA 20 MEQ tablet Commonly known as:  K-DUR,KLOR-CON Take 1 tablet (20 mEq total) by mouth daily.   spironolactone 25 MG tablet Commonly known as:  ALDACTONE Take 1 tablet (25 mg total) by mouth daily.   tamsulosin 0.4 MG Caps capsule Commonly known as:  FLOMAX Take 1 capsule (0.4 mg total) by mouth at bedtime.      Allergies  Allergen Reactions  . Cefuroxime Axetil Hives and Itching   Follow-up Information    Nona DellSamuel McDowell, MD. Go on 11/21/2016.   Specialty:  Cardiology Why:  at 1:15pm Contact information: [address] [place] [address] [phone]        Waverly Ferrariickson, Christopher, MD. Schedule an appointment as soon as possible for a visit in 2 week(s).   Specialties:  Vascular Surgery, Cardiology Why:  Office will call Contact information: [address] [place] [address] [phone]        [facility] Follow up.   Contact information: [address] [place] [address] [phone]            The results of significant diagnostics from this hospitalization (including imaging, microbiology, ancillary and laboratory) are listed below for reference.    Significant Diagnostic Studies: Dg Chest 2 View  Result Date: 11/04/2016 CLINICAL DATA:  Short of breath, swelling in extremities and eyelids. EXAM: CHEST  2 VIEW COMPARISON:  10/16/2016 FINDINGS: Normal cardiac silhouette. There is mild interstitial edema pattern similar to comparison exam. This interstitial pattern is superimposed on chronic interstitial lung disease. No effusion, infiltrate pneumothorax. IMPRESSION: Interstitial edema superimposed on chronic interstitial lung disease. Electronically  Signed   By: Genevive BiStewart  Edmunds M.D.   On: 11/04/2016 18:46   [place] Aorta  Result Date: 11/06/2016 CLINICAL DATA:  Abdominal aortic aneurysm. EXAM: ULTRASOUND OF ABDOMINAL AORTA TECHNIQUE: Ultrasound examination of the abdominal aorta was performed to evaluate for abdominal aortic aneurysm. COMPARISON:  CT scan of November 01, 2016. FINDINGS: Abdominal Aorta Infrarenal fusiform abdominal aortic aneurysm is noted with maximum measured transverse diameter of 6.4 cm. Maximum Diameter: 6.4 cm distally. IMPRESSION: 6.4 cm infrarenal abdominal aortic aneurysm. Due to the increased risk of rupture for abdominal aortic aneurysms of this size, vascular surgery consultation is recommended. This recommendation follows ACR consensus guidelines: White Paper of the ACR Incidental Findings Committee II on Vascular Findings. J Am Coll Radiol 2013; 10:789-794. These results will be called to the ordering clinician or representative by the Radiologist Assistant, and communication documented in the PACS or zVision Dashboard. Electronically Signed   By: Lupita RaiderJames  Green Jr, M.D.   On: 11/06/2016 16:36    Microbiology: No results found for  this or any previous visit (from the past 240 hour(s)).   Labs: Basic Metabolic Panel:  Recent Labs Lab 11/04/16 1736 11/05/16 0431 11/06/16 0447 11/07/16 0504 11/08/16 0538  NA 136 141 141 140 138  K 4.2 4.0 4.3 4.6 4.7  CL 106 109 108 104 103  CO2 22 25 26 26 25   GLUCOSE 106* 89 102* 123* 134*  BUN 33* 30* 25* 22* 26*  CREATININE 1.20 1.19 1.14 1.31* 1.45*  CALCIUM 8.1* 7.8* 8.0* 8.7* 9.0   Liver Function Tests:  Recent Labs Lab 11/04/16 1736  AST 21  ALT 17  ALKPHOS 56  BILITOT 0.6  PROT 6.2*  ALBUMIN 2.5*   No results for input(s): LIPASE, AMYLASE in the last 168 hours. No results for input(s): AMMONIA in the last 168 hours. CBC:  Recent Labs Lab 11/04/16 1736 11/07/16 1541 11/07/16 2337 11/08/16 0538  WBC 11.5* 13.7* 14.0*  --   NEUTROABS 8.2*  --   --   --    HGB 10.2* 11.4* 11.6* 12.0*  HCT 33.0* 36.8* 37.5* 39.5  MCV 78.0 77.0* 77.0*  --   PLT 332 326 348  --    Cardiac Enzymes:  Recent Labs Lab 11/04/16 1736 11/04/16 2122 11/05/16 0431 11/05/16 0846  TROPONINI 0.22* 0.23* 0.21* 0.22*   BNP: BNP (last 3 results)  Recent Labs  11/04/16 1736  BNP 635.0*    ProBNP (last 3 results) No results for input(s): PROBNP in the last 8760 hours.  CBG: No results for input(s): GLUCAP in the last 168 hours.     SignedChaya Jan  [facility] Pager: [phone] 11/08/2016, 6:42 PM

## 2016-11-13 ENCOUNTER — Emergency Department (HOSPITAL_COMMUNITY)
Admission: EM | Admit: 2016-11-13 | Discharge: 2016-11-13 | Disposition: A | Discharge status: Home / Self Care | Payer: Medicare Other | Source: Home / Self Care | Attending: Emergency Medicine | Admitting: Emergency Medicine

## 2016-11-13 ENCOUNTER — Emergency Department (HOSPITAL_COMMUNITY): Payer: Medicare Other

## 2016-11-13 ENCOUNTER — Encounter (HOSPITAL_COMMUNITY): Payer: Self-pay

## 2016-11-13 DIAGNOSIS — I509 Heart failure, unspecified: Secondary | ICD-10-CM

## 2016-11-13 DIAGNOSIS — I110 Hypertensive heart disease with heart failure: Secondary | ICD-10-CM

## 2016-11-13 DIAGNOSIS — Z79899 Other long term (current) drug therapy: Secondary | ICD-10-CM

## 2016-11-13 DIAGNOSIS — Z87891 Personal history of nicotine dependence: Secondary | ICD-10-CM

## 2016-11-13 DIAGNOSIS — I2510 Atherosclerotic heart disease of native coronary artery without angina pectoris: Secondary | ICD-10-CM | POA: Insufficient documentation

## 2016-11-13 DIAGNOSIS — Z7982 Long term (current) use of aspirin: Secondary | ICD-10-CM

## 2016-11-13 DIAGNOSIS — E860 Dehydration: Secondary | ICD-10-CM | POA: Insufficient documentation

## 2016-11-13 DIAGNOSIS — R531 Weakness: Secondary | ICD-10-CM

## 2016-11-13 LAB — URINALYSIS, ROUTINE W REFLEX MICROSCOPIC
BILIRUBIN URINE: NEGATIVE
Glucose, UA: NEGATIVE mg/dL
KETONES UR: NEGATIVE mg/dL
Leukocytes, UA: NEGATIVE
NITRITE: NEGATIVE
PH: 5 (ref 5.0–8.0)
PROTEIN: 30 mg/dL — AB
Specific Gravity, Urine: 1.02 (ref 1.005–1.030)

## 2016-11-13 LAB — CBC
HEMATOCRIT: 45.3 % (ref 39.0–52.0)
HEMOGLOBIN: 13.9 g/dL (ref 13.0–17.0)
MCH: 24.1 pg — AB (ref 26.0–34.0)
MCHC: 30.7 g/dL (ref 30.0–36.0)
MCV: 78.6 fL (ref 78.0–100.0)
Platelets: 272 10*3/uL (ref 150–400)
RBC: 5.76 MIL/uL (ref 4.22–5.81)
RDW: 20.2 % — AB (ref 11.5–15.5)
WBC: 7.2 10*3/uL (ref 4.0–10.5)

## 2016-11-13 LAB — BASIC METABOLIC PANEL
ANION GAP: 10 (ref 5–15)
BUN: 55 mg/dL — AB (ref 6–20)
CALCIUM: 8.8 mg/dL — AB (ref 8.9–10.3)
CO2: 25 mmol/L (ref 22–32)
Chloride: 111 mmol/L (ref 101–111)
Creatinine, Ser: 1.51 mg/dL — ABNORMAL HIGH (ref 0.61–1.24)
GFR calc Af Amer: 46 mL/min — ABNORMAL LOW (ref >60)
GFR, EST NON AFRICAN AMERICAN: 40 mL/min — AB (ref >60)
GLUCOSE: 135 mg/dL — AB (ref 65–99)
POTASSIUM: 5.3 mmol/L — AB (ref 3.5–5.1)
SODIUM: 146 mmol/L — AB (ref 135–145)

## 2016-11-13 LAB — CBG MONITORING, ED: Glucose-Capillary: 121 mg/dL — ABNORMAL HIGH (ref 65–99)

## 2016-11-13 LAB — BRAIN NATRIURETIC PEPTIDE: B NATRIURETIC PEPTIDE 5: 240 pg/mL — AB (ref 0.0–100.0)

## 2016-11-13 LAB — TROPONIN I: TROPONIN I: 0.05 ng/mL — AB (ref <0.03)

## 2016-11-13 MED ORDER — SODIUM CHLORIDE 0.9 % IV BOLUS (SEPSIS)
1000.0000 mL | Freq: Once | INTRAVENOUS | Status: AC
  Administered 2016-11-13: 1000 mL via INTRAVENOUS

## 2016-11-13 MED ORDER — FUROSEMIDE 40 MG PO TABS: 40.0000 mg | ORAL_TABLET | Freq: Every day | ORAL | 2 refills | Status: DC

## 2016-11-13 NOTE — ED Provider Notes (Signed)
[facility]-EMERGENCY DEPT Provider Note   CSN: [id number] Arrival date & time: 11/13/16  1732     History   Chief Complaint Chief Complaint  Patient presents with  . Weakness    HPI Christopher [name] is a 83 y.o. male.  The history is provided by the patient, a relative and a caregiver.  Weakness  Primary symptoms include no focal weakness (diffuse). This is a new problem. The current episode started more than 2 days ago. The problem has been gradually worsening. There was no focality noted. There has been no fever. Pertinent negatives include no shortness of breath. There were no medications administered prior to arrival.   Was recently in the hospital for CHF exacerbation with a reportedly diuresed 35-40 pounds worth of fluid off of him. He has continued diuresis when he went home with his by mouth Lasix and is progressively worsening weakness and decreased appetite as well.  Past Medical History:  Diagnosis Date  . AAA (abdominal aortic aneurysm) (HCC)   . BPH (benign prostatic hyperplasia)   . Hemorrhoids   . Hypertension     Patient Active Problem List   Diagnosis Date Noted  . Acute on chronic systolic heart failure (HCC) 11/04/2016  . Elevated troponin 11/04/2016  . Atrial fibrillation (HCC) 11/04/2016  . H/O inguinal hernia repair 11/04/2016  . AAA (abdominal aortic aneurysm) without rupture (HCC) 11/03/2016  . HTN (hypertension) 07/10/2013  . Chronic coronary artery disease 04/30/2013  . 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 Scleroderma (HCC) 01/09/2013  . Chronic systolic heart failure (HCC) 01/09/2013  . Nodule of right lung, 8 mm right lower lobe in May 2014 01/01/2013  . Pneumonitis 01/01/2013  . Esophageal dysmotility 01/01/2013  . Arteriosclerotic cardiovascular disease (ASCVD) 12/29/2012  . Acute-on-chronic respiratory failure (HCC)  12/26/2012  . UTI (urinary tract infection) 12/24/2012    Past Surgical History:  Procedure Laterality Date  . CAROTID STENT    . LEFT HEART CATHETERIZATION WITH CORONARY ANGIOGRAM N/A 12/20/2012   Procedure: LEFT HEART CATHETERIZATION WITH CORONARY ANGIOGRAM;  Surgeon: Laurey Moralealton S McLean, MD;  [facility];  Service: Cardiovascular;  Laterality: N/A;  . PERCUTANEOUS CORONARY STENT INTERVENTION (PCI-S) N/A 12/25/2012   Procedure: PERCUTANEOUS CORONARY STENT INTERVENTION (PCI-S);  Surgeon: Kathleene Hazelhristopher D McAlhany, MD;  [facility];  Service: Cardiovascular;  Laterality: N/A;       Home Medications    Prior to Admission medications   Medication Sig Start Date End Date Taking? Authorizing Provider  aspirin EC 81 MG EC tablet Take 1 tablet (81 mg total) by mouth daily. 11/09/16  Yes Henderson CloudEstela Y Hernandez Acosta, MD  atorvastatin (LIPITOR) 40 MG tablet Take 40 mg by mouth daily.   Yes Historical Provider, MD  cholecalciferol (VITAMIN D) 1000 units tablet Take 1,000 Units by mouth daily.   Yes Historical Provider, MD  clopidogrel (PLAVIX) 75 MG tablet Take 1 tablet (75 mg total) by mouth daily. 11/08/16  Yes Henderson CloudEstela Y Hernandez Acosta, MD  cyanocobalamin 1000 MCG tablet Take 1,000 mcg by mouth daily.   Yes Historical Provider, MD  enalapril (VASOTEC) 5 MG tablet Take 1 tablet (5 mg total) by mouth at bedtime. 11/08/16  Yes Estela Isaiah BlakesY Hernandez Acosta, MD  meclizine (ANTIVERT) 12.5 MG tablet Take 12.5 mg by mouth 3 (three) times daily as needed for dizziness.   Yes Historical Provider, MD  metoprolol succinate (TOPROL-XL) 25 MG 24 hr  tablet Take 1 tablet (25 mg total) by mouth daily. 11/08/16  Yes Estela Isaiah Blakes, MD  potassium chloride SA (K-DUR,KLOR-CON) 20 MEQ tablet Take 1 tablet (20 mEq total) by mouth daily. 11/08/16  Yes Estela Isaiah Blakes, MD  PROAIR HFA 108 [phone] Base) MCG/ACT inhaler Inhale 1-2 puffs into the lungs every 6 (six) hours as needed for wheezing or shortness  of breath.  11/01/16  Yes Historical Provider, MD  spironolactone (ALDACTONE) 25 MG tablet Take 1 tablet (25 mg total) by mouth daily. 11/08/16  Yes Estela Isaiah Blakes, MD  tamsulosin (FLOMAX) 0.4 MG CAPS capsule Take 1 capsule (0.4 mg total) by mouth at bedtime. 11/08/16  Yes Estela Isaiah Blakes, MD  furosemide (LASIX) 40 MG tablet Take 1 tablet (40 mg total) by mouth daily. 11/13/16 11/20/16  Marily Memos, MD    Family History Family History  Problem Relation Age of Onset  . Other      no premature CAD.  Marland Kitchen Congestive Heart Failure Sister     Social History Social History  Substance Use Topics  . Smoking status: Former Smoker    Packs/day: 1.00    Years: 20.00    Types: Cigarettes    Quit date: 08/21/1958  . Smokeless tobacco: Never Used     Comment: quit over 40 yrs ago.  . Alcohol use No     Allergies   Cefuroxime axetil   Review of Systems Review of Systems  Respiratory: Negative for shortness of breath.   Neurological: Positive for weakness. Negative for focal weakness (diffuse).  All other systems reviewed and are negative.    Physical Exam Updated Vital Signs BP (!) 143/77   Pulse 77   Temp 97.9 F (36.6 C) (Oral)   Resp (!) 27   SpO2 96%   Physical Exam  Constitutional: He is oriented to person, place, and time. He appears well-developed and well-nourished.  HENT:  Head: Normocephalic and atraumatic.  Eyes: Conjunctivae and EOM are normal. Pupils are equal, round, and reactive to light. Right eye exhibits no discharge. Left eye exhibits no discharge.  sunken  Neck: Normal range of motion.  Cardiovascular: Normal rate.   Pulmonary/Chest: Effort normal. No respiratory distress.  Abdominal: Soft. He exhibits no distension. There is no tenderness. There is no guarding.  Musculoskeletal: Normal range of motion. He exhibits no edema.  Neurological: He is alert and oriented to person, place, and time. No cranial nerve deficit. Coordination normal.    Skin: Skin is dry.  dry  Nursing note and vitals reviewed.    ED Treatments / Results  Labs (all labs ordered are listed, but only abnormal results are displayed) Labs Reviewed  BASIC METABOLIC PANEL - Abnormal; Notable for the following:       Result Value   Sodium 146 (*)    Potassium 5.3 (*)    Glucose, Bld 135 (*)    BUN 55 (*)    Creatinine, Ser 1.51 (*)    Calcium 8.8 (*)    GFR calc non Af Amer 40 (*)    GFR calc Af Amer 46 (*)    All other components within normal limits  CBC - Abnormal; Notable for the following:    MCH 24.1 (*)    RDW 20.2 (*)    All other components within normal limits  URINALYSIS, ROUTINE W REFLEX MICROSCOPIC - Abnormal; Notable for the following:    Hgb urine dipstick MODERATE (*)    Protein, ur 30 (*)  Bacteria, UA RARE (*)    Squamous Epithelial / LPF 0-5 (*)    All other components within normal limits  TROPONIN I - Abnormal; Notable for the following:    Troponin I 0.05 (*)    All other components within normal limits  BRAIN NATRIURETIC PEPTIDE - Abnormal; Notable for the following:    B Natriuretic Peptide 240.0 (*)    All other components within normal limits  CBG MONITORING, ED - Abnormal; Notable for the following:    Glucose-Capillary 121 (*)    All other components within normal limits    EKG  EKG Interpretation  Date/Time:  Monday November 13 2016 17:54:41 EDT Ventricular Rate:  79 PR Interval:    QRS Duration: 110 QT Interval:  402 QTC Calculation: 461 R Axis:   -8 Text Interpretation:  Atrial fibrillation LVH with secondary repolarization abnormality Anterior infarct, old No acute changes Compared to 11/04/16 Confirmed by LIU MD, DANA [phone]) on 11/14/2016 12:18:12 PM       Radiology Dg Chest 2 View  Result Date: 11/13/2016 CLINICAL DATA:  Evaluate for pulmonary edema EXAM: CHEST  2 VIEW COMPARISON:  11/04/2016 FINDINGS: Chronic mild cardiomegaly. There is low volume chest with diffuse interstitial opacity but no  Kerley lines or effusion. Current opacities likely related to patient's pulmonary fibrosis as seen on abdominal CT 11/01/2016. No air bronchograms or asymmetric density. No pneumothorax. IMPRESSION: Pulmonary fibrosis.  No evidence of acute superimposed disease. Electronically Signed   By: Marnee Spring M.D.   On: 11/13/2016 18:56    Procedures Procedures (including critical care time)  Medications Ordered in ED Medications  sodium chloride 0.9 % bolus 1,000 mL (0 mLs Intravenous Stopped 11/13/16 2222)     Initial Impression / Assessment and Plan / ED Course  I have reviewed the triage vital signs and the nursing notes.  Pertinent labs & imaging results that were available during my care of the patient were reviewed by me and considered in my medical decision making (see chart for details).     Suspect likely dehydration from over-diuresing. Will give some hydration, check labs and dispo appropriately.   Color, strength, mental status all signifiantly improved after a liter of fluids. Lab reassuring, doubt other significant causes for weakness like infection, stroke. Will half his lasix for the next week and follow up with his cardiologist.   Final Clinical Impressions(s) / ED Diagnoses   Final diagnoses:  Dehydration  Weakness    New Prescriptions Discharge Medication List as of 11/13/2016 10:17 PM       Marily Memos, MD 11/15/16 [phone]

## 2016-11-13 NOTE — ED Notes (Signed)
EKG given to Dr. Mesner. 

## 2016-11-13 NOTE — Discharge Instructions (Signed)
Please decrease your dose of lasix from twice a day to once a day for the next week.  Afterwards, continue taking twice a day and follow up with your scheduled cardiology appointment.

## 2016-11-13 NOTE — ED Notes (Signed)
Pt denies any dialysis tx.

## 2016-11-13 NOTE — ED Triage Notes (Addendum)
Generalized weakness since discharge from hospital on 3/22. Patient admitted on 3/17 for CHF.  EMS reports that 30 lbs of fluid were pulled off of patient during treatment. Since then patient has not been able to walk since discharge due to profound weakness. Grips weak bil, unable to lift legs. Speech weak but coherent. PEARL,

## 2016-11-15 ENCOUNTER — Emergency Department (HOSPITAL_COMMUNITY): Payer: Medicare Other

## 2016-11-15 ENCOUNTER — Inpatient Hospital Stay (HOSPITAL_COMMUNITY)
Admission: EM | Admit: 2016-11-15 | Discharge: 2016-11-21 | DRG: 698 | Disposition: A | Discharge status: Hospice | Payer: Medicare Other | Attending: Internal Medicine | Admitting: Internal Medicine

## 2016-11-15 ENCOUNTER — Encounter (HOSPITAL_COMMUNITY): Payer: Self-pay

## 2016-11-15 DIAGNOSIS — I482 Chronic atrial fibrillation: Secondary | ICD-10-CM | POA: Diagnosis present

## 2016-11-15 DIAGNOSIS — L89321 Pressure ulcer of left buttock, stage 1: Secondary | ICD-10-CM | POA: Diagnosis present

## 2016-11-15 DIAGNOSIS — Z8249 Family history of ischemic heart disease and other diseases of the circulatory system: Secondary | ICD-10-CM

## 2016-11-15 DIAGNOSIS — Z66 Do not resuscitate: Secondary | ICD-10-CM | POA: Diagnosis present

## 2016-11-15 DIAGNOSIS — I959 Hypotension, unspecified: Secondary | ICD-10-CM | POA: Diagnosis present

## 2016-11-15 DIAGNOSIS — R0682 Tachypnea, not elsewhere classified: Secondary | ICD-10-CM | POA: Diagnosis present

## 2016-11-15 DIAGNOSIS — Z79899 Other long term (current) drug therapy: Secondary | ICD-10-CM

## 2016-11-15 DIAGNOSIS — E876 Hypokalemia: Secondary | ICD-10-CM | POA: Diagnosis not present

## 2016-11-15 DIAGNOSIS — E870 Hyperosmolality and hypernatremia: Secondary | ICD-10-CM | POA: Diagnosis present

## 2016-11-15 DIAGNOSIS — R339 Retention of urine, unspecified: Secondary | ICD-10-CM | POA: Diagnosis present

## 2016-11-15 DIAGNOSIS — E860 Dehydration: Secondary | ICD-10-CM | POA: Diagnosis present

## 2016-11-15 DIAGNOSIS — Z6823 Body mass index (BMI) 23.0-23.9, adult: Secondary | ICD-10-CM

## 2016-11-15 DIAGNOSIS — F0390 Unspecified dementia without behavioral disturbance: Secondary | ICD-10-CM | POA: Diagnosis present

## 2016-11-15 DIAGNOSIS — Z515 Encounter for palliative care: Secondary | ICD-10-CM | POA: Diagnosis not present

## 2016-11-15 DIAGNOSIS — I714 Abdominal aortic aneurysm, without rupture: Secondary | ICD-10-CM | POA: Diagnosis present

## 2016-11-15 DIAGNOSIS — N390 Urinary tract infection, site not specified: Secondary | ICD-10-CM | POA: Diagnosis present

## 2016-11-15 DIAGNOSIS — N179 Acute kidney failure, unspecified: Secondary | ICD-10-CM | POA: Diagnosis present

## 2016-11-15 DIAGNOSIS — Z7982 Long term (current) use of aspirin: Secondary | ICD-10-CM

## 2016-11-15 DIAGNOSIS — L8990 Pressure ulcer of unspecified site, unspecified stage: Secondary | ICD-10-CM | POA: Insufficient documentation

## 2016-11-15 DIAGNOSIS — I2510 Atherosclerotic heart disease of native coronary artery without angina pectoris: Secondary | ICD-10-CM | POA: Diagnosis present

## 2016-11-15 DIAGNOSIS — N3001 Acute cystitis with hematuria: Secondary | ICD-10-CM | POA: Diagnosis not present

## 2016-11-15 DIAGNOSIS — R627 Adult failure to thrive: Secondary | ICD-10-CM | POA: Diagnosis not present

## 2016-11-15 DIAGNOSIS — T83511A Infection and inflammatory reaction due to indwelling urethral catheter, initial encounter: Principal | ICD-10-CM | POA: Diagnosis present

## 2016-11-15 DIAGNOSIS — N401 Enlarged prostate with lower urinary tract symptoms: Secondary | ICD-10-CM | POA: Diagnosis present

## 2016-11-15 DIAGNOSIS — E785 Hyperlipidemia, unspecified: Secondary | ICD-10-CM | POA: Diagnosis present

## 2016-11-15 DIAGNOSIS — I110 Hypertensive heart disease with heart failure: Secondary | ICD-10-CM | POA: Diagnosis present

## 2016-11-15 DIAGNOSIS — I5032 Chronic diastolic (congestive) heart failure: Secondary | ICD-10-CM | POA: Diagnosis present

## 2016-11-15 DIAGNOSIS — A419 Sepsis, unspecified organism: Secondary | ICD-10-CM | POA: Diagnosis present

## 2016-11-15 DIAGNOSIS — Y846 Urinary catheterization as the cause of abnormal reaction of the patient, or of later complication, without mention of misadventure at the time of the procedure: Secondary | ICD-10-CM | POA: Diagnosis present

## 2016-11-15 DIAGNOSIS — R531 Weakness: Secondary | ICD-10-CM | POA: Diagnosis not present

## 2016-11-15 DIAGNOSIS — Z87891 Personal history of nicotine dependence: Secondary | ICD-10-CM

## 2016-11-15 DIAGNOSIS — Z7401 Bed confinement status: Secondary | ICD-10-CM

## 2016-11-15 DIAGNOSIS — Z955 Presence of coronary angioplasty implant and graft: Secondary | ICD-10-CM | POA: Diagnosis not present

## 2016-11-15 LAB — COMPREHENSIVE METABOLIC PANEL
ALT: 31 U/L (ref 17–63)
ANION GAP: 13 (ref 5–15)
AST: 48 U/L — AB (ref 15–41)
Albumin: 3 g/dL — ABNORMAL LOW (ref 3.5–5.0)
Alkaline Phosphatase: 58 U/L (ref 38–126)
BUN: 73 mg/dL — AB (ref 6–20)
CHLORIDE: 119 mmol/L — AB (ref 101–111)
CO2: 18 mmol/L — AB (ref 22–32)
Calcium: 8.7 mg/dL — ABNORMAL LOW (ref 8.9–10.3)
Creatinine, Ser: 2.07 mg/dL — ABNORMAL HIGH (ref 0.61–1.24)
GFR calc Af Amer: 32 mL/min — ABNORMAL LOW (ref >60)
GFR calc non Af Amer: 27 mL/min — ABNORMAL LOW (ref >60)
GLUCOSE: 145 mg/dL — AB (ref 65–99)
POTASSIUM: 4.5 mmol/L (ref 3.5–5.1)
Sodium: 150 mmol/L — ABNORMAL HIGH (ref 135–145)
Total Bilirubin: 0.9 mg/dL (ref 0.3–1.2)
Total Protein: 7.6 g/dL (ref 6.5–8.1)

## 2016-11-15 LAB — URINALYSIS, ROUTINE W REFLEX MICROSCOPIC
BILIRUBIN URINE: NEGATIVE
GLUCOSE, UA: NEGATIVE mg/dL
Ketones, ur: NEGATIVE mg/dL
Nitrite: NEGATIVE
PH: 5 (ref 5.0–8.0)
PROTEIN: 100 mg/dL — AB
SPECIFIC GRAVITY, URINE: 1.019 (ref 1.005–1.030)

## 2016-11-15 LAB — CBC WITH DIFFERENTIAL/PLATELET
Basophils Absolute: 0 10*3/uL (ref 0.0–0.1)
Basophils Relative: 0 %
EOS PCT: 0 %
Eosinophils Absolute: 0 10*3/uL (ref 0.0–0.7)
HEMATOCRIT: 44.7 % (ref 39.0–52.0)
Hemoglobin: 13.4 g/dL (ref 13.0–17.0)
LYMPHS ABS: 1.4 10*3/uL (ref 0.7–4.0)
LYMPHS PCT: 15 %
MCH: 23.9 pg — AB (ref 26.0–34.0)
MCHC: 30 g/dL (ref 30.0–36.0)
MCV: 79.7 fL (ref 78.0–100.0)
Monocytes Absolute: 0.8 10*3/uL (ref 0.1–1.0)
Monocytes Relative: 9 %
NEUTROS ABS: 6.8 10*3/uL (ref 1.7–7.7)
Neutrophils Relative %: 76 %
PLATELETS: 274 10*3/uL (ref 150–400)
RBC: 5.61 MIL/uL (ref 4.22–5.81)
RDW: 20.9 % — ABNORMAL HIGH (ref 11.5–15.5)
WBC: 8.9 10*3/uL (ref 4.0–10.5)

## 2016-11-15 LAB — PROTIME-INR
INR: 1.18
Prothrombin Time: 15.1 seconds (ref 11.4–15.2)

## 2016-11-15 LAB — MRSA PCR SCREENING: MRSA BY PCR: NEGATIVE

## 2016-11-15 LAB — I-STAT CG4 LACTIC ACID, ED
Lactic Acid, Venous: 2.51 mmol/L (ref 0.5–1.9)
Lactic Acid, Venous: 1.71 mmol/L (ref 0.5–1.9)

## 2016-11-15 LAB — APTT: APTT: 30 s (ref 24–36)

## 2016-11-15 LAB — PROCALCITONIN: Procalcitonin: <0.1 ng/mL

## 2016-11-15 MED ORDER — ONDANSETRON HCL 4 MG/2ML IJ SOLN
4.0000 mg | Freq: Four times a day (QID) | INTRAMUSCULAR | Status: DC | PRN
Start: 2016-11-15 — End: 2016-11-21

## 2016-11-15 MED ORDER — DEXTROSE-NACL 5-0.45 % IV SOLN: INTRAVENOUS | Status: DC

## 2016-11-15 MED ORDER — VANCOMYCIN HCL IN DEXTROSE 750-5 MG/150ML-% IV SOLN
750.0000 mg | Freq: Once | INTRAVENOUS | Status: AC
  Administered 2016-11-15: 750 mg via INTRAVENOUS
  Filled 2016-11-15: qty 150

## 2016-11-15 MED ORDER — HEPARIN SODIUM (PORCINE) 5000 UNIT/ML IJ SOLN
5000.0000 [IU] | Freq: Three times a day (TID) | INTRAMUSCULAR | Status: DC
  Administered 2016-11-15 – 2016-11-21 (×17): 5000 [IU] via SUBCUTANEOUS
  Filled 2016-11-15 (×17): qty 1

## 2016-11-15 MED ORDER — ASPIRIN 81 MG PO TBEC: 81.0000 mg | DELAYED_RELEASE_TABLET | Freq: Every day | ORAL | Status: DC

## 2016-11-15 MED ORDER — ONDANSETRON HCL 4 MG PO TABS: 4.0000 mg | ORAL_TABLET | Freq: Four times a day (QID) | ORAL | Status: DC | PRN

## 2016-11-15 MED ORDER — CLOPIDOGREL BISULFATE 75 MG PO TABS
75.0000 mg | ORAL_TABLET | Freq: Every day | ORAL | Status: DC
  Administered 2016-11-15 – 2016-11-21 (×7): 75 mg via ORAL
  Filled 2016-11-15 (×7): qty 1

## 2016-11-15 MED ORDER — ATORVASTATIN CALCIUM 40 MG PO TABS
40.0000 mg | ORAL_TABLET | Freq: Every day | ORAL | Status: DC
  Administered 2016-11-15 – 2016-11-21 (×7): 40 mg via ORAL
  Filled 2016-11-15 (×7): qty 1

## 2016-11-15 MED ORDER — SODIUM CHLORIDE 0.9% FLUSH
3.0000 mL | Freq: Two times a day (BID) | INTRAVENOUS | Status: DC
  Administered 2016-11-15 – 2016-11-21 (×10): 3 mL via INTRAVENOUS

## 2016-11-15 MED ORDER — DEXTROSE 5 % IV SOLN
2.0000 g | Freq: Once | INTRAVENOUS | Status: AC
  Administered 2016-11-15: 2 g via INTRAVENOUS
  Filled 2016-11-15: qty 2

## 2016-11-15 MED ORDER — ACETAMINOPHEN 650 MG RE SUPP: 650.0000 mg | Freq: Four times a day (QID) | RECTAL | Status: DC | PRN

## 2016-11-15 MED ORDER — SODIUM CHLORIDE 0.9 % IV SOLN
1750.0000 mg | Freq: Once | INTRAVENOUS | Status: DC
Start: 2016-11-15 — End: 2016-11-15
  Filled 2016-11-15: qty 1750

## 2016-11-15 MED ORDER — POLYETHYLENE GLYCOL 3350 17 G PO PACK: 17.0000 g | PACK | Freq: Every day | ORAL | Status: DC | PRN

## 2016-11-15 MED ORDER — VITAMIN D 1000 UNITS PO TABS
1000.0000 [IU] | ORAL_TABLET | Freq: Every day | ORAL | Status: DC
  Administered 2016-11-15 – 2016-11-21 (×7): 1000 [IU] via ORAL
  Filled 2016-11-15 (×7): qty 1

## 2016-11-15 MED ORDER — ACETAMINOPHEN 325 MG PO TABS
650.0000 mg | ORAL_TABLET | Freq: Four times a day (QID) | ORAL | Status: DC | PRN
Start: 2016-11-15 — End: 2016-11-18

## 2016-11-15 MED ORDER — DEXTROSE 5 % IV SOLN
1.0000 g | Freq: Three times a day (TID) | INTRAVENOUS | Status: DC
  Filled 2016-11-15 (×2): qty 1

## 2016-11-15 MED ORDER — SODIUM CHLORIDE 0.9 % IV BOLUS (SEPSIS)
1000.0000 mL | Freq: Once | INTRAVENOUS | Status: AC
Start: 2016-11-15 — End: 2016-11-15
  Administered 2016-11-15: 1000 mL via INTRAVENOUS

## 2016-11-15 MED ORDER — TAMSULOSIN HCL 0.4 MG PO CAPS
0.4000 mg | ORAL_CAPSULE | Freq: Every day | ORAL | Status: DC
  Administered 2016-11-15 – 2016-11-20 (×6): 0.4 mg via ORAL
  Filled 2016-11-15 (×6): qty 1

## 2016-11-15 MED ORDER — VANCOMYCIN HCL IN DEXTROSE 1-5 GM/200ML-% IV SOLN
1000.0000 mg | Freq: Once | INTRAVENOUS | Status: DC
  Administered 2016-11-15: 1000 mg via INTRAVENOUS
  Filled 2016-11-15: qty 200

## 2016-11-15 MED ORDER — ASPIRIN EC 81 MG PO TBEC
81.0000 mg | DELAYED_RELEASE_TABLET | Freq: Every day | ORAL | Status: DC
  Administered 2016-11-16 – 2016-11-21 (×6): 81 mg via ORAL
  Filled 2016-11-15 (×6): qty 1

## 2016-11-15 MED ORDER — ALBUTEROL SULFATE (2.5 MG/3ML) 0.083% IN NEBU: 3.0000 mL | INHALATION_SOLUTION | Freq: Four times a day (QID) | RESPIRATORY_TRACT | Status: DC | PRN

## 2016-11-15 MED ORDER — DEXTROSE 5 % IV SOLN
INTRAVENOUS | Status: AC
  Administered 2016-11-15: 20:00:00 via INTRAVENOUS

## 2016-11-15 MED ORDER — VITAMIN B-12 1000 MCG PO TABS
1000.0000 ug | ORAL_TABLET | Freq: Every day | ORAL | Status: DC
  Administered 2016-11-15 – 2016-11-21 (×7): 1000 ug via ORAL
  Filled 2016-11-15 (×7): qty 1

## 2016-11-15 MED ORDER — TRAZODONE HCL 50 MG PO TABS: 25.0000 mg | ORAL_TABLET | Freq: Every evening | ORAL | Status: DC | PRN

## 2016-11-15 MED ORDER — LEVOFLOXACIN IN D5W 500 MG/100ML IV SOLN
500.0000 mg | INTRAVENOUS | Status: DC
Start: 2016-11-17 — End: 2016-11-16

## 2016-11-15 MED ORDER — LEVOFLOXACIN IN D5W 750 MG/150ML IV SOLN
750.0000 mg | Freq: Once | INTRAVENOUS | Status: AC
  Administered 2016-11-15: 750 mg via INTRAVENOUS
  Filled 2016-11-15: qty 150

## 2016-11-15 MED ORDER — SODIUM CHLORIDE 0.9 % IV BOLUS (SEPSIS)
1000.0000 mL | Freq: Once | INTRAVENOUS | Status: AC
  Administered 2016-11-15: 1000 mL via INTRAVENOUS

## 2016-11-15 MED ORDER — VANCOMYCIN HCL IN DEXTROSE 1-5 GM/200ML-% IV SOLN: 1000.0000 mg | Freq: Once | INTRAVENOUS | Status: DC

## 2016-11-15 NOTE — Progress Notes (Addendum)
Pharmacy Antibiotic Note  Christopher [name] is a 83 y.o. male admitted on 11/15/2016 with sepsis.  Pharmacy has been consulted for levofloxacin/aztreonam dosing. Also has vancomycin ordered x1 per EDP. Afebrile, WBC wnl. SCr 2.07 on admit (SCr 1.3-1.5 during last week), CrCl~25. Noted hx of allergy to cefuroxime of hives, itching - no history of cephalosporin/PCN use in Epic.  Spoke with RN - had already started the vancomycin 1g IV x 1 in the ED. Will order 750mg  IV x 1 to complete full vancomycin load.  Plan: Levofloxacin 750mg  IV x1; then 500mg  IV q48h Aztreonam 2g IV x1; then 1g IV q8h Vancomycin 1750mg  IV x 1 per MD Monitor clinical progress, c/s, renal function, Vancomycin level as indicated F/u de-escalation plan/LOT   Height: 5\' 9"  (175.3 cm) Weight: 160 lb (72.6 kg) IBW/kg (Calculated) : 70.7  Temp (24hrs), Avg:100 F (37.8 C), Min:100 F (37.8 C), Max:100 F (37.8 C)   Recent Labs Lab 11/13/16 1838 11/15/16 1221 11/15/16 1255  WBC 7.2 8.9  --   CREATININE 1.51*  --   --   LATICACIDVEN  --   --  2.51*    Estimated Creatinine Clearance: 35.1 mL/min (A) (by C-G formula based on SCr of 1.51 mg/dL (H)).    Allergies  Allergen Reactions  . Cefuroxime Axetil Hives and Itching    Babs BertinHaley Atara Paterson, PharmD, BCPS Clinical Pharmacist 11/15/2016 1:30 PM

## 2016-11-15 NOTE — ED Notes (Signed)
Zammits informed of lab result

## 2016-11-15 NOTE — ED Notes (Signed)
Xray at the bedside.

## 2016-11-15 NOTE — ED Provider Notes (Signed)
[facility] Provider Note   CSN: [id number] Arrival date & time: 11/15/16  1157     History   Chief Complaint Chief Complaint  Patient presents with  . Dehydration    HPI Christopher [name] is a 83 y.o. male.  Patient was sent from home by home health stating the patient was dehydrated and very weak   The history is provided by a caregiver. No language interpreter was used.  Weakness  Primary symptoms comment: General weakness. This is a recurrent problem. The problem has not changed since onset.There was no focality noted. The maximum temperature recorded prior to his arrival was 100 to 100.9 F. The fever has been present for less than 1 day. Pertinent negatives include no shortness of breath. There were no medications administered prior to arrival. Associated medical issues do not include trauma.    Past Medical History:  Diagnosis Date  . AAA (abdominal aortic aneurysm) (HCC)   . BPH (benign prostatic hyperplasia)   . Hemorrhoids   . Hypertension     Patient Active Problem List   Diagnosis Date Noted  . Acute on chronic systolic heart failure (HCC) 11/04/2016  . Elevated troponin 11/04/2016  . Atrial fibrillation (HCC) 11/04/2016  . H/O inguinal hernia repair 11/04/2016  . AAA (abdominal aortic aneurysm) without rupture (HCC) 11/03/2016  . HTN (hypertension) 07/10/2013  . Chronic coronary artery disease 04/30/2013  . 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 Scleroderma (HCC) 01/09/2013  . Chronic systolic heart failure (HCC) 01/09/2013  . Nodule of right lung, 8 mm right lower lobe in May 2014 01/01/2013  . Pneumonitis 01/01/2013  . Esophageal dysmotility 01/01/2013  . Arteriosclerotic cardiovascular disease (ASCVD) 12/29/2012  . Acute-on-chronic respiratory failure (HCC) 12/26/2012  . UTI (urinary tract infection) 12/24/2012    Past  Surgical History:  Procedure Laterality Date  . CAROTID STENT    . LEFT HEART CATHETERIZATION WITH CORONARY ANGIOGRAM N/A 12/20/2012   Procedure: LEFT HEART CATHETERIZATION WITH CORONARY ANGIOGRAM;  Surgeon: Laurey Morale, MD;  Location: [facility] CATH LAB;  Service: Cardiovascular;  Laterality: N/A;  . PERCUTANEOUS CORONARY STENT INTERVENTION (PCI-S) N/A 12/25/2012   Procedure: PERCUTANEOUS CORONARY STENT INTERVENTION (PCI-S);  Surgeon: Kathleene Hazel, MD;  Location: [facility] CATH LAB;  Service: Cardiovascular;  Laterality: N/A;       Home Medications    Prior to Admission medications   Medication Sig Start Date End Date Taking? Authorizing Provider  aspirin EC 81 MG EC tablet Take 1 tablet (81 mg total) by mouth daily. 11/09/16  Yes Henderson Cloud, MD  atorvastatin (LIPITOR) 40 MG tablet Take 40 mg by mouth daily.   Yes Historical Provider, MD  cholecalciferol (VITAMIN D) 1000 units tablet Take 1,000 Units by mouth daily.   Yes Historical Provider, MD  clopidogrel (PLAVIX) 75 MG tablet Take 1 tablet (75 mg total) by mouth daily. 11/08/16  Yes Henderson Cloud, MD  cyanocobalamin 1000 MCG tablet Take 1,000 mcg by mouth daily.   Yes Historical Provider, MD  enalapril (VASOTEC) 5 MG tablet Take 1 tablet (5 mg total) by mouth at bedtime. 11/08/16  Yes Estela Isaiah Blakes, MD  furosemide (LASIX) 40 MG tablet Take 1 tablet (40 mg total) by mouth daily. 11/13/16 11/20/16 Yes Marily Memos, MD  meclizine (ANTIVERT) 12.5 MG tablet Take 12.5 mg by mouth 3 (three) times daily as needed for dizziness.   Yes Historical  Provider, MD  metoprolol succinate (TOPROL-XL) 25 MG 24 hr tablet Take 1 tablet (25 mg total) by mouth daily. 11/08/16  Yes Estela Isaiah BlakesY Hernandez Acosta, MD  potassium chloride SA (K-DUR,KLOR-CON) 20 MEQ tablet Take 1 tablet (20 mEq total) by mouth daily. 11/08/16  Yes Estela Isaiah BlakesY Hernandez Acosta, MD  PROAIR HFA 108 [phone](90 Base) MCG/ACT inhaler Inhale 1-2 puffs into the lungs every 6  (six) hours as needed for wheezing or shortness of breath.  11/01/16  Yes Historical Provider, MD  spironolactone (ALDACTONE) 25 MG tablet Take 1 tablet (25 mg total) by mouth daily. 11/08/16  Yes Estela Isaiah BlakesY Hernandez Acosta, MD  tamsulosin (FLOMAX) 0.4 MG CAPS capsule Take 1 capsule (0.4 mg total) by mouth at bedtime. 11/08/16  Yes Estela Isaiah BlakesY Hernandez Acosta, MD    Family History Family History  Problem Relation Age of Onset  . Other      no premature CAD.  Marland Kitchen. Congestive Heart Failure Sister     Social History Social History  Substance Use Topics  . Smoking status: Former Smoker    Packs/day: 1.00    Years: 20.00    Types: Cigarettes    Quit date: 08/21/1958  . Smokeless tobacco: Never Used     Comment: quit over 40 yrs ago.  . Alcohol use No     Allergies   Cefuroxime axetil   Review of Systems Review of Systems  Unable to perform ROS: Mental status change  Respiratory: Negative for shortness of breath.   Neurological: Positive for weakness.     Physical Exam Updated Vital Signs BP (!) 116/53   Pulse (!) 54   Temp 100 F (37.8 C) (Rectal)   Resp (!) 21   Ht 5\' 9"  (1.753 m)   Wt 160 lb (72.6 kg)   SpO2 97%   BMI 23.63 kg/m   Physical Exam  Constitutional:  Cachectic  HENT:  Head: Normocephalic.  Mucous membranes very dry  Eyes: Conjunctivae and EOM are normal. No scleral icterus.  Neck: Neck supple. No thyromegaly present.  Cardiovascular: Normal rate.  Exam reveals no gallop and no friction rub.   No murmur heard. Pulmonary/Chest: No stridor. He has no wheezes. He has no rales. He exhibits no tenderness.  Abdominal: He exhibits no distension. There is no tenderness. There is no rebound.  Musculoskeletal: Normal range of motion. He exhibits no edema.  Lymphadenopathy:    He has no cervical adenopathy.  Neurological: He exhibits normal muscle tone. Coordination normal.  Patient is very lethargic. Patient is oriented to person and place  Skin: No rash noted.  No erythema.  Psychiatric: He has a normal mood and affect. His behavior is normal.     ED Treatments / Results  Labs (all labs ordered are listed, but only abnormal results are displayed) Labs Reviewed  CBC WITH DIFFERENTIAL/PLATELET - Abnormal; Notable for the following:       Result Value   MCH 23.9 (*)    RDW 20.9 (*)    All other components within normal limits  COMPREHENSIVE METABOLIC PANEL - Abnormal; Notable for the following:    Sodium 150 (*)    Chloride 119 (*)    CO2 18 (*)    Glucose, Bld 145 (*)    BUN 73 (*)    Creatinine, Ser 2.07 (*)    Calcium 8.7 (*)    Albumin 3.0 (*)    AST 48 (*)    GFR calc non Af Amer 27 (*)  GFR calc Af Amer 32 (*)    All other components within normal limits  URINALYSIS, ROUTINE W REFLEX MICROSCOPIC - Abnormal; Notable for the following:    Color, Urine AMBER (*)    APPearance CLOUDY (*)    Hgb urine dipstick LARGE (*)    Protein, ur 100 (*)    Leukocytes, UA MODERATE (*)    Bacteria, UA FEW (*)    Squamous Epithelial / LPF 0-5 (*)    Non Squamous Epithelial 0-5 (*)    All other components within normal limits  I-STAT CG4 LACTIC ACID, ED - Abnormal; Notable for the following:    Lactic Acid, Venous 2.51 (*)    All other components within normal limits  CULTURE, BLOOD (ROUTINE X 2)  CULTURE, BLOOD (ROUTINE X 2)  URINE CULTURE  I-STAT CHEM 8, ED  I-STAT CG4 LACTIC ACID, ED    EKG  EKG Interpretation  Date/Time:  Wednesday November 15 2016 12:10:18 EDT Ventricular Rate:  80 PR Interval:    QRS Duration: 111 QT Interval:  384 QTC Calculation: 443 R Axis:   -15 Text Interpretation:  Atrial fibrillation LVH with IVCD and secondary repol abnrm Inferior infarct, age indeterminate Confirmed by Judie Hollick  MD, Osmar Howton [id number]) on 11/15/2016 2:57:18 PM       Radiology Dg Chest 2 View  Result Date: 11/13/2016 CLINICAL DATA:  Evaluate for pulmonary edema EXAM: CHEST  2 VIEW COMPARISON:  11/04/2016 FINDINGS: Chronic mild  cardiomegaly. There is low volume chest with diffuse interstitial opacity but no Kerley lines or effusion. Current opacities likely related to patient's pulmonary fibrosis as seen on abdominal CT 11/01/2016. No air bronchograms or asymmetric density. No pneumothorax. IMPRESSION: Pulmonary fibrosis.  No evidence of acute superimposed disease. Electronically Signed   By: Marnee Spring M.D.   On: 11/13/2016 18:56   Dg Chest Port 1 View  Result Date: 11/15/2016 CLINICAL DATA:  Weakness, shortness of Breath EXAM: PORTABLE CHEST 1 VIEW COMPARISON:  11/13/2016 FINDINGS: Cardiomediastinal silhouette is stable. Stable bilateral reticular mild interstitial prominence and peripheral fibrotic changes. No definite superimposed infiltrate or pulmonary edema. IMPRESSION: Stable bilateral reticular mild interstitial prominence and peripheral fibrotic changes. No definite superimposed infiltrate or pulmonary edema. Electronically Signed   By: Natasha Mead M.D.   On: 11/15/2016 13:04    Procedures Procedures (including critical care time)  Medications Ordered in ED Medications  levofloxacin (LEVAQUIN) IVPB 500 mg (not administered)  aztreonam (AZACTAM) 1 g in dextrose 5 % 50 mL IVPB (not administered)  vancomycin (VANCOCIN) IVPB 1000 mg/200 mL premix (0 mg Intravenous Hold 11/15/16 1400)  sodium chloride 0.9 % bolus 1,000 mL (0 mLs Intravenous Stopped 11/15/16 1429)  sodium chloride 0.9 % bolus 1,000 mL (0 mLs Intravenous Stopped 11/15/16 1429)  levofloxacin (LEVAQUIN) IVPB 750 mg (0 mg Intravenous Stopped 11/15/16 1442)  aztreonam (AZACTAM) 2 g in dextrose 5 % 50 mL IVPB (0 g Intravenous Stopped 11/15/16 1345)  vancomycin (VANCOCIN) IVPB 750 mg/150 ml premix (750 mg Intravenous Given 11/15/16 1435)     Initial Impression / Assessment and Plan / ED Course  I have reviewed the triage vital signs and the nursing notes.  Pertinent labs & imaging results that were available during my care of the patient were reviewed  by me and considered in my medical decision making (see chart for details).    CRITICAL CARE Performed by: Naylene Foell L Total critical care time: 40 minutes Critical care time was exclusive of separately billable procedures and treating other patients.  Critical care was necessary to treat or prevent imminent or life-threatening deterioration. Critical care was time spent personally by me on the following activities: development of treatment plan with patient and/or surrogate as well as nursing, discussions with consultants, evaluation of patient's response to treatment, examination of patient, obtaining history from patient or surrogate, ordering and performing treatments and interventions, ordering and review of laboratory studies, ordering and review of radiographic studies, pulse oximetry and re-evaluation of patient's condition.  Patient will be admitted for severe dehydration and urinary tract infection  Final Clinical Impressions(s) / ED Diagnoses   Final diagnoses:  Dehydration    New Prescriptions New Prescriptions   No medications on file     Bethann Berkshire, MD 11/15/16 1552

## 2016-11-15 NOTE — ED Notes (Signed)
Family given coffee. Pt and family denies any other needs at this time.

## 2016-11-15 NOTE — ED Notes (Signed)
Phlebotomy at the bedside  

## 2016-11-15 NOTE — ED Notes (Signed)
Admitting MD at the bedside.  

## 2016-11-15 NOTE — ED Notes (Signed)
Paged Admitting MD for Diet order for patient.

## 2016-11-15 NOTE — ED Triage Notes (Signed)
[facility], Pt is coming from home with complaints of dehydration per home health RN. Pt was hypotensive at first, but has increased since transport. Vitals per EMS: 98% on RA, 144 CBG, 97.1 Temp, Initial BP 91/60 - Last BP 118/76, 80 HR with PVCs. Pt is alert and oriented x4 at baseline. Pupils noted to be pinpoint upon EMS arrival. Hx of Lasix and CHF.

## 2016-11-15 NOTE — ED Notes (Signed)
Attempted Report x1.   

## 2016-11-15 NOTE — ED Notes (Signed)
activated code sepsis with [facility]- spoke with doug

## 2016-11-15 NOTE — H&P (Signed)
.  History and Physical    JAVONTAY VANDAM ZDG:[id number] DOB: 01-Apr-1928 DOA: 11/15/2016  PCP: Louie Boston, MD   Patient coming from: home  Chief Complaint: progressive weakness  HPI: Christopher [name] is a 83 y.o. male with medical history significant for CAD, hypertension, atrial fibrillation, chronic diastolic CHF, abdominal aortic aneurysm who was recently hospitalized with acute exacerbation of CHF and UTI and discharged home on 11/08/2016 The patient presented to the hospital with worsening of weakness. His caregiver who is present at bedside said that since the day of discharge patient weak, but was able to get out of the bed and had a decent appetite. However today when she attempted to lift him from the bed he was profoundly weak and unable to stand started shaking and falling. She called paramedics and patient was delivered to the emergency room for evaluation. Note, his caregiver reported that patient lost> 35 Lbs during hospitalization d/t IV diuresis  Patient denied any chest pain, SOB, abdominal pain, dysuria, he only c/o weakness  ED Course: In the ED patient was found to be hypotensive with blood pressure 85/53 mmHg, he was tachypneic, temperature was 100F Blood work showed lactic acid 2.51, sodium was elevated at 150 BUN 73 and creatinine 12.07. AST was mildly elevated at 48 Chest x-ray showed chronic stable bilateral reticular interstitial prominence with peripheral fibrotic changes, no definite superimposed infiltrate or pulmonary edema. EKG showed A. Fib with controlled heart rate and ST-T wave changes which are not new UA  showed too numerous to count  Review of Systems: As per HPI otherwise 10 point review of systems negative.   Ambulatory Status: Uses walker  Past Medical History:  Diagnosis Date  . AAA (abdominal aortic aneurysm) (HCC)   . BPH (benign prostatic hyperplasia)   . Hemorrhoids   . Hypertension     Past Surgical History:  Procedure Laterality  Date  . CAROTID STENT    . LEFT HEART CATHETERIZATION WITH CORONARY ANGIOGRAM N/A 12/20/2012   Procedure: LEFT HEART CATHETERIZATION WITH CORONARY ANGIOGRAM;  Surgeon: Laurey Morale, MD;  Location: [facility] CATH LAB;  Service: Cardiovascular;  Laterality: N/A;  . PERCUTANEOUS CORONARY STENT INTERVENTION (PCI-S) N/A 12/25/2012   Procedure: PERCUTANEOUS CORONARY STENT INTERVENTION (PCI-S);  Surgeon: Kathleene Hazel, MD;  Location: [facility] CATH LAB;  Service: Cardiovascular;  Laterality: N/A;    Social History   Social History  . Marital status: Widowed    Spouse name: N/A  . Number of children: N/A  . Years of education: N/A   Occupational History  . Not on file.   Social History Main Topics  . Smoking status: Former Smoker    Packs/day: 1.00    Years: 20.00    Types: Cigarettes    Quit date: 08/21/1958  . Smokeless tobacco: Never Used     Comment: quit over 40 yrs ago.  . Alcohol use No  . Drug use: No  . Sexual activity: Not on file   Other Topics Concern  . Not on file   Social History Narrative   Lives in [place] with his nephew.  Retired Visual merchandiser and other odd jobs.    Allergies  Allergen Reactions  . Cefuroxime Axetil Hives and Itching    Family History  Problem Relation Age of Onset  . Other      no premature CAD.  Marland Kitchen Congestive Heart Failure Sister     Prior to Admission medications   Medication Sig Start Date End Date Taking? Authorizing Provider  aspirin EC 81 MG EC tablet Take 1 tablet (81 mg total) by mouth daily. 11/09/16  Yes Henderson CloudEstela Y Hernandez Acosta, MD  atorvastatin (LIPITOR) 40 MG tablet Take 40 mg by mouth daily.   Yes Historical Provider, MD  cholecalciferol (VITAMIN D) 1000 units tablet Take 1,000 Units by mouth daily.   Yes Historical Provider, MD  clopidogrel (PLAVIX) 75 MG tablet Take 1 tablet (75 mg total) by mouth daily. 11/08/16  Yes Henderson CloudEstela Y Hernandez Acosta, MD  cyanocobalamin 1000 MCG tablet Take 1,000 mcg by mouth daily.   Yes Historical  Provider, MD  enalapril (VASOTEC) 5 MG tablet Take 1 tablet (5 mg total) by mouth at bedtime. 11/08/16  Yes Estela Isaiah BlakesY Hernandez Acosta, MD  furosemide (LASIX) 40 MG tablet Take 1 tablet (40 mg total) by mouth daily. 11/13/16 11/20/16 Yes Marily MemosJason Mesner, MD  meclizine (ANTIVERT) 12.5 MG tablet Take 12.5 mg by mouth 3 (three) times daily as needed for dizziness.   Yes Historical Provider, MD  metoprolol succinate (TOPROL-XL) 25 MG 24 hr tablet Take 1 tablet (25 mg total) by mouth daily. 11/08/16  Yes Estela Isaiah BlakesY Hernandez Acosta, MD  potassium chloride SA (K-DUR,KLOR-CON) 20 MEQ tablet Take 1 tablet (20 mEq total) by mouth daily. 11/08/16  Yes Estela Isaiah BlakesY Hernandez Acosta, MD  PROAIR HFA 108 [phone](90 Base) MCG/ACT inhaler Inhale 1-2 puffs into the lungs every 6 (six) hours as needed for wheezing or shortness of breath.  11/01/16  Yes Historical Provider, MD  spironolactone (ALDACTONE) 25 MG tablet Take 1 tablet (25 mg total) by mouth daily. 11/08/16  Yes Estela Isaiah BlakesY Hernandez Acosta, MD  tamsulosin (FLOMAX) 0.4 MG CAPS capsule Take 1 capsule (0.4 mg total) by mouth at bedtime. 11/08/16  Yes Henderson CloudEstela Y Hernandez Acosta, MD    Physical Exam: Vitals:   11/15/16 1545 11/15/16 1600 11/15/16 1615 11/15/16 1634  BP: (!) 116/53 (!) 116/55 (!) 116/53 (!) 119/57  Pulse: (!) 54 (!) 56 (!) 47 (!) 55  Resp: (!) 21 (!) 23 19 20   Temp:      TempSrc:      SpO2: 97% 98% 98% 98%  Weight:      Height:         General: Appears calm and lethrgic Eyes: PERRLA, EOMI, normal lids, iris ENT:  grossly normal hearing, lips & tongue, mucous membranes are dryNeck: no lymphoadenopathy, masses or thyromegaly Cardiovascular: RRR, no m/r/g. No JVD, carotid bruits. No LE edema.  Respiratory: bilateral no wheezes, rales, rhonchi or cracles. Normal respiratory effort. No accessory muscle use observed Abdomen: soft, non-tender, non-distended, no organomegaly or masses appreciated. BS present in all quadrants Skin: dry with diminished turgor, left calf  has scabbed lesions Musculoskeletal: grossly normal tone BUE/BLE, good ROM, no bony abnormality or joint deformities observed Psychiatric: grossly normal mood and affect, speech fluent and appropriate, alert and oriented x3 Neurologic: CN II-XII grossly intact, moves all extremities in coordinated fashion, sensation intact  Labs on Admission: I have personally reviewed following labs and imaging studies  CBC, BMP  GFR: Estimated Creatinine Clearance: 25.6 mL/min (A) (by C-G formula based on SCr of 2.07 mg/dL (H)).   Creatinine Clearance: Estimated Creatinine Clearance: 25.6 mL/min (A) (by C-G formula based on SCr of 2.07 mg/dL (H)).    Radiological Exams on Admission: Dg Chest 2 View  Result Date: 11/13/2016 CLINICAL DATA:  Evaluate for pulmonary edema EXAM: CHEST  2 VIEW COMPARISON:  11/04/2016 FINDINGS: Chronic mild cardiomegaly. There is low volume chest with diffuse interstitial opacity  but no Kerley lines or effusion. Current opacities likely related to patient's pulmonary fibrosis as seen on abdominal CT 11/01/2016. No air bronchograms or asymmetric density. No pneumothorax. IMPRESSION: Pulmonary fibrosis.  No evidence of acute superimposed disease. Electronically Signed   By: Marnee Spring M.D.   On: 11/13/2016 18:56   Dg Chest Port 1 View  Result Date: 11/15/2016 CLINICAL DATA:  Weakness, shortness of Breath EXAM: PORTABLE CHEST 1 VIEW COMPARISON:  11/13/2016 FINDINGS: Cardiomediastinal silhouette is stable. Stable bilateral reticular mild interstitial prominence and peripheral fibrotic changes. No definite superimposed infiltrate or pulmonary edema. IMPRESSION: Stable bilateral reticular mild interstitial prominence and peripheral fibrotic changes. No definite superimposed infiltrate or pulmonary edema. Electronically Signed   By: Natasha Mead M.D.   On: 11/15/2016 13:04    EKG: Independently reviewed - Afib with controlled rate and TW changes that are not  new  Assessment/Plan Active Problems:   Sepsis (HCC)   Presumed sepsis most likely associated with unresolved UTI in setting of dehydration On presentation patient had mild fever-100F, hypertension, tachypnea, elevated lactic acid and his urine was abnormal He received 2 L of fluid in the ED and lactic acid returned to normal Urine and blood cultures were submitted for culture, will order pro-calcitonin Continue IV antibiotics and monitor fever and WBC count  Dehydration in patient with known history of diastolic CHF  -  suspect associated with over diuresis during the last admission  He has hypernatremia and elevated creatinine that is consistent with diagnosis Last Echo with normal EF 55-60% on 11/05/16, which has improved from 45-50% on previous EKG in 2014 Caregiver reports that patient weighed > 200 lbs prior to last admission and at discharge his weight was 160 lbs Continue IV hydration, hold diuretics, monitor daily I/O to avoid fluid volume excess  AKI - associated with dehydration -  Creatinine today 2.07 and last normal 1.14 was on 11/05/2016 Will  proceed with gentle hydration and continue to monitor renal indices  HTN - presented with low BP, will place home meds on hold and restart when BP allows  A Fib - chronic, rate controlled, beta blocker is on hold  Hyperlipidemia - continue Lipitor  DVT prophylaxis: Heparin Code Status: DNR Family Communcation: none Disposition Plan: ltelemetry Consults called: none Admission status: inpatient   Raymon Mutton, [place] Pager: [phone] [facility]  If 7PM-7AM, please contact night-coverage [email or web address] Password [facility]  11/15/2016, 4:55 PM

## 2016-11-16 DIAGNOSIS — N3001 Acute cystitis with hematuria: Secondary | ICD-10-CM

## 2016-11-16 DIAGNOSIS — N179 Acute kidney failure, unspecified: Secondary | ICD-10-CM

## 2016-11-16 DIAGNOSIS — I5032 Chronic diastolic (congestive) heart failure: Secondary | ICD-10-CM

## 2016-11-16 DIAGNOSIS — L8990 Pressure ulcer of unspecified site, unspecified stage: Secondary | ICD-10-CM | POA: Insufficient documentation

## 2016-11-16 DIAGNOSIS — Z515 Encounter for palliative care: Secondary | ICD-10-CM

## 2016-11-16 DIAGNOSIS — Z66 Do not resuscitate: Secondary | ICD-10-CM

## 2016-11-16 DIAGNOSIS — R627 Adult failure to thrive: Secondary | ICD-10-CM

## 2016-11-16 DIAGNOSIS — A419 Sepsis, unspecified organism: Secondary | ICD-10-CM

## 2016-11-16 LAB — BASIC METABOLIC PANEL
ANION GAP: 10 (ref 5–15)
ANION GAP: 7 (ref 5–15)
BUN: 53 mg/dL — ABNORMAL HIGH (ref 6–20)
BUN: 62 mg/dL — ABNORMAL HIGH (ref 6–20)
CALCIUM: 7.6 mg/dL — AB (ref 8.9–10.3)
CHLORIDE: 120 mmol/L — AB (ref 101–111)
CO2: 19 mmol/L — AB (ref 22–32)
CO2: 20 mmol/L — AB (ref 22–32)
Calcium: 8 mg/dL — ABNORMAL LOW (ref 8.9–10.3)
Chloride: 122 mmol/L — ABNORMAL HIGH (ref 101–111)
Creatinine, Ser: 1.37 mg/dL — ABNORMAL HIGH (ref 0.61–1.24)
Creatinine, Ser: 1.5 mg/dL — ABNORMAL HIGH (ref 0.61–1.24)
GFR calc Af Amer: 52 mL/min — ABNORMAL LOW (ref >60)
GFR calc non Af Amer: 45 mL/min — ABNORMAL LOW (ref >60)
GFR, EST AFRICAN AMERICAN: 47 mL/min — AB (ref >60)
GFR, EST NON AFRICAN AMERICAN: 40 mL/min — AB (ref >60)
Glucose, Bld: 100 mg/dL — ABNORMAL HIGH (ref 65–99)
Glucose, Bld: 123 mg/dL — ABNORMAL HIGH (ref 65–99)
POTASSIUM: 3.8 mmol/L (ref 3.5–5.1)
Potassium: 3.7 mmol/L (ref 3.5–5.1)
Sodium: 149 mmol/L — ABNORMAL HIGH (ref 135–145)
Sodium: 149 mmol/L — ABNORMAL HIGH (ref 135–145)

## 2016-11-16 LAB — CBC WITH DIFFERENTIAL/PLATELET
BASOS PCT: 0 %
Basophils Absolute: 0 10*3/uL (ref 0.0–0.1)
EOS ABS: 0 10*3/uL (ref 0.0–0.7)
EOS PCT: 0 %
HCT: 36.1 % — ABNORMAL LOW (ref 39.0–52.0)
Hemoglobin: 10.7 g/dL — ABNORMAL LOW (ref 13.0–17.0)
Lymphocytes Relative: 24 %
Lymphs Abs: 1.7 10*3/uL (ref 0.7–4.0)
MCH: 23.5 pg — AB (ref 26.0–34.0)
MCHC: 29.6 g/dL — ABNORMAL LOW (ref 30.0–36.0)
MCV: 79.2 fL (ref 78.0–100.0)
Monocytes Absolute: 0.5 10*3/uL (ref 0.1–1.0)
Monocytes Relative: 7 %
NEUTROS ABS: 5 10*3/uL (ref 1.7–7.7)
NEUTROS PCT: 69 %
PLATELETS: 193 10*3/uL (ref 150–400)
RBC: 4.56 MIL/uL (ref 4.22–5.81)
RDW: 20.9 % — ABNORMAL HIGH (ref 11.5–15.5)
WBC: 7.3 10*3/uL (ref 4.0–10.5)

## 2016-11-16 LAB — COMPREHENSIVE METABOLIC PANEL
ALT: 24 U/L (ref 17–63)
ANION GAP: 13 (ref 5–15)
AST: 37 U/L (ref 15–41)
Albumin: 2.2 g/dL — ABNORMAL LOW (ref 3.5–5.0)
Alkaline Phosphatase: 46 U/L (ref 38–126)
BUN: 56 mg/dL — ABNORMAL HIGH (ref 6–20)
CHLORIDE: 119 mmol/L — AB (ref 101–111)
CO2: 18 mmol/L — AB (ref 22–32)
Calcium: 8.1 mg/dL — ABNORMAL LOW (ref 8.9–10.3)
Creatinine, Ser: 1.38 mg/dL — ABNORMAL HIGH (ref 0.61–1.24)
GFR calc non Af Amer: 45 mL/min — ABNORMAL LOW (ref >60)
GFR, EST AFRICAN AMERICAN: 52 mL/min — AB (ref >60)
Glucose, Bld: 99 mg/dL (ref 65–99)
Potassium: 3.7 mmol/L (ref 3.5–5.1)
SODIUM: 150 mmol/L — AB (ref 135–145)
Total Bilirubin: 0.5 mg/dL (ref 0.3–1.2)
Total Protein: 5.6 g/dL — ABNORMAL LOW (ref 6.5–8.1)

## 2016-11-16 LAB — URINE CULTURE: Culture: NO GROWTH

## 2016-11-16 MED ORDER — HYDROCERIN EX CREA
TOPICAL_CREAM | Freq: Two times a day (BID) | CUTANEOUS | Status: DC | PRN
  Administered 2016-11-17: 10:00:00 via TOPICAL
  Filled 2016-11-16: qty 113

## 2016-11-16 MED ORDER — DEXTROSE 5 % IV SOLN
1.0000 g | Freq: Three times a day (TID) | INTRAVENOUS | Status: DC
  Administered 2016-11-16 – 2016-11-17 (×3): 1 g via INTRAVENOUS
  Filled 2016-11-16 (×4): qty 1

## 2016-11-16 MED ORDER — SODIUM CHLORIDE 0.45 % IV SOLN
INTRAVENOUS | Status: AC
  Administered 2016-11-16: 11:00:00 via INTRAVENOUS

## 2016-11-16 NOTE — Progress Notes (Signed)
PROGRESS NOTE        PATIENT DETAILS Name: Christopher [name] Age: 83 y.o. Sex: male Date of Birth: 05-23-1928 Admit Date: 11/15/2016 Admitting Physician Standley Brooking, MD ZOX:WRUEAV,WUJWJ B, MD  Brief Narrative: Patient is a 83 y.o. male known history of diastolic heart failure, no history of A. fib not on anticoagulation, AAA followed by vascular surgery, recent hernia repair, chronic Foley catheter in place (in place since hernia repair) brought to the hospital with worsening generalized weakness, poor appetite. Thought to have sepsis secondary to catheter-related UTI, and admitted to the hospitalist service. See below for further details  Subjective: Very weak and frail.  Assessment/Plan: Sepsis secondary to catheter associated UTI: Sepsis pathophysiology has resolved-although clinically improved, he remains very frail and weak. Plans are to continue aspirin exam-suspect we can discontinue both levofloxacin and vancomycin. Follow cultures.  Hypernatremia: Likely due to dehydration-diuretic use. Appears dry on my exam, gently hydrate and recheck electrolytes.  History of urinary retention: Apparently occurred after hernia surgery done a few weeks back (Feb 26) at [facility] sure if patient failed a voiding trial, he still has a Foley catheter in place. Await arrival of family to get more collaborative information. For now continue with Foley catheter and Flomax.  Acute kidney injury: Secondary mild hemodynamically mediated kidney injury and improving with gentle hydration.  Hypertension: Continue to hold antihypertensives today-suspect could resume over the next few days.  Chronic diastolic heart failure: Compensated-infected dry on my exam today. Continue to hold diuretics until sodium improves further.  Atrial fibrillation: Chronic issue, currently rate controlled without the use of any rate limiting agents. Per chart review, anticoagulation has  been stopped due to recent hematochezia, increased bleeding risk.  AAA (6.4 cm infrarenal abdominal aortic aneurysm): Recently seen by vascular surgery as an outpatient, with plans for further discussion with family. Patient is very elderly and frail-doubt he will be able to tolerate any major intervention at this time.  Failure to thrive syndrome/generalized weakness: Spoke at length with patient's caregiver over the phone-patient has had a steady decline since hernia surgery-he is mostly bedbound and nonambulatory for the past 2-3 weeks. His appetite is very poor. Family/caregiver understands overall poor prognoses, they're agreeable to consult with palliative care and slowly transition to hospice care at home if no improvement. We'll consult palliative care. DO NOT RESUSCITATE in place.PT eval pending  DVT Prophylaxis: Prophylactic Lovenox   Code Status:  DNR  Family Communication: Spoke with caregiver over phone-she will get in touch with patient's nephew and relay above.  Disposition Plan: Remain inpatient-likely home with hospice  Antimicrobial agents: Anti-infectives    Start     Dose/Rate Route Frequency Ordered Stop   11/17/16 1300  levofloxacin (LEVAQUIN) IVPB 500 mg     500 mg 100 mL/hr over 60 Minutes Intravenous Every 48 hours 11/15/16 1331     11/15/16 2100  aztreonam (AZACTAM) 1 g in dextrose 5 % 50 mL IVPB  Status:  Discontinued     1 g 100 mL/hr over 30 Minutes Intravenous Every 8 hours 11/15/16 1331 11/15/16 1821   11/15/16 1400  vancomycin (VANCOCIN) IVPB 750 mg/150 ml premix     750 mg 150 mL/hr over 60 Minutes Intravenous  Once 11/15/16 1346 11/15/16 1535   11/15/16 1400  vancomycin (VANCOCIN) IVPB 1000 mg/200 mL premix  Status:  Discontinued  1,000 mg 200 mL/hr over 60 Minutes Intravenous  Once 11/15/16 1346 11/16/16 1135   11/15/16 1315  vancomycin (VANCOCIN) 1,750 mg in sodium chloride 0.9 % 500 mL IVPB  Status:  Discontinued     1,750 mg 250 mL/hr over  120 Minutes Intravenous  Once 11/15/16 1312 11/15/16 1346   11/15/16 1300  vancomycin (VANCOCIN) IVPB 1000 mg/200 mL premix  Status:  Discontinued     1,000 mg 200 mL/hr over 60 Minutes Intravenous  Once 11/15/16 1248 11/15/16 1412   11/15/16 1300  levofloxacin (LEVAQUIN) IVPB 750 mg     750 mg 100 mL/hr over 90 Minutes Intravenous  Once 11/15/16 1252 11/15/16 1442   11/15/16 1300  aztreonam (AZACTAM) 2 g in dextrose 5 % 50 mL IVPB     2 g 100 mL/hr over 30 Minutes Intravenous  Once 11/15/16 1252 11/15/16 1345      Procedures: None  CONSULTS:  None  Time spent: 25- minutes-Greater than 50% of this time was spent in counseling, explanation of diagnosis, planning of further management, and coordination of care.  MEDICATIONS: Scheduled Meds: . aspirin EC  81 mg Oral Daily  . atorvastatin  40 mg Oral Daily  . cholecalciferol  1,000 Units Oral Daily  . clopidogrel  75 mg Oral Daily  . heparin  5,000 Units Subcutaneous Q8H  . [START ON 11/17/2016] levofloxacin (LEVAQUIN) IV  500 mg Intravenous Q48H  . sodium chloride flush  3 mL Intravenous Q12H  . tamsulosin  0.4 mg Oral QHS  . cyanocobalamin  1,000 mcg Oral Daily   Continuous Infusions: . sodium chloride 40 mL/hr at 11/16/16 1035   PRN Meds:.acetaminophen **OR** acetaminophen, albuterol, hydrocerin, ondansetron **OR** ondansetron (ZOFRAN) IV, polyethylene glycol, traZODone   PHYSICAL EXAM: Vital signs: Vitals:   11/16/16 0027 11/16/16 0420 11/16/16 0817 11/16/16 1154  BP: (!) 121/59 (!) 113/56 (!) 120/57 (!) 121/57  Pulse: (!) 57 (!) 56 (!) 58 63  Resp: (!) 23 19 (!) 21 (!) 23  Temp: 98.6 F (37 C) 98.1 F (36.7 C) 97.6 F (36.4 C) 98.3 F (36.8 C)  TempSrc:   Oral Oral  SpO2: 99% 98% 97% 96%  Weight:  73.6 kg (162 lb 3.2 oz)    Height:       Filed Weights   11/15/16 1218 11/15/16 1846 11/16/16 0420  Weight: 72.6 kg (160 lb) 72.2 kg (159 lb 1.6 oz) 73.6 kg (162 lb 3.2 oz)   Body mass index is 23.95 kg/m.    General appearance :Awake, alert, not in any distress-but is very frail and chronically sick appearing. Eyes:, pupils equally reactive to light and accomodation,no scleral icterus. HEENT: Atraumatic and Normocephalic Neck: supple, no JVD. No cervical lymphadenopathy.  Resp:Good air entry bilaterally CVS: S1 S2 regular GI: Bowel sounds present, Non tender and not distended with no gaurding, rigidity or rebound. Extremities: B/L Lower Ext shows no edema, both legs are warm to touch Neurology:  speech clear,Non focal, sensation is grossly intact. Musculoskeletal:No digital cyanosis Wounds:N/A  I have personally reviewed following labs and imaging studies  LABORATORY DATA: CBC:  Recent Labs Lab 11/13/16 1838 11/15/16 1221 11/16/16 0551  WBC 7.2 8.9 7.3  NEUTROABS  --  6.8 5.0  HGB 13.9 13.4 10.7*  HCT 45.3 44.7 36.1*  MCV 78.6 79.7 79.2  PLT 272 274 193    Basic Metabolic Panel:  Recent Labs Lab 11/13/16 1838 11/15/16 1221 11/16/16 0003 11/16/16 0551 11/16/16 1042  NA 146* 150* 149* 150* 149*  K 5.3* 4.5 3.8 3.7 3.7  CL 111 119* 122* 119* 120*  CO2 25 18* 20* 18* 19*  GLUCOSE 135* 145* 123* 99 100*  BUN 55* 73* 62* 56* 53*  CREATININE 1.51* 2.07* 1.50* 1.38* 1.37*  CALCIUM 8.8* 8.7* 7.6* 8.1* 8.0*    GFR: Estimated Creatinine Clearance: 38.7 mL/min (A) (by C-G formula based on SCr of 1.37 mg/dL (H)).  Liver Function Tests:  Recent Labs Lab 11/15/16 1221 11/16/16 0551  AST 48* 37  ALT 31 24  ALKPHOS 58 46  BILITOT 0.9 0.5  PROT 7.6 5.6*  ALBUMIN 3.0* 2.2*   No results for input(s): LIPASE, AMYLASE in the last 168 hours. No results for input(s): AMMONIA in the last 168 hours.  Coagulation Profile:  Recent Labs Lab 11/15/16 1850  INR 1.18    Cardiac Enzymes:  Recent Labs Lab 11/13/16 1838  TROPONINI 0.05*    BNP (last 3 results) No results for input(s): PROBNP in the last 8760 hours.  HbA1C: No results for input(s): HGBA1C in the  last 72 hours.  CBG:  Recent Labs Lab 11/13/16 1846  GLUCAP 121*    Lipid Profile: No results for input(s): CHOL, HDL, LDLCALC, TRIG, CHOLHDL, LDLDIRECT in the last 72 hours.  Thyroid Function Tests: No results for input(s): TSH, T4TOTAL, FREET4, T3FREE, THYROIDAB in the last 72 hours.  Anemia Panel: No results for input(s): VITAMINB12, FOLATE, FERRITIN, TIBC, IRON, RETICCTPCT in the last 72 hours.  Urine analysis:    Component Value Date/Time   COLORURINE AMBER (A) 11/15/2016 1224   APPEARANCEUR CLOUDY (A) 11/15/2016 1224   LABSPEC 1.019 11/15/2016 1224   PHURINE 5.0 11/15/2016 1224   GLUCOSEU NEGATIVE 11/15/2016 1224   HGBUR LARGE (A) 11/15/2016 1224   BILIRUBINUR NEGATIVE 11/15/2016 1224   KETONESUR NEGATIVE 11/15/2016 1224   PROTEINUR 100 (A) 11/15/2016 1224   UROBILINOGEN 4.0 (H) 12/24/2012 0929   NITRITE NEGATIVE 11/15/2016 1224   LEUKOCYTESUR MODERATE (A) 11/15/2016 1224    Sepsis Labs: Lactic Acid, Venous    Component Value Date/Time   LATICACIDVEN 1.71 11/15/2016 1604    MICROBIOLOGY: Recent Results (from the past 240 hour(s))  Urine culture     Status: None   Collection Time: 11/15/16 12:24 PM  Result Value Ref Range Status   Specimen Description URINE, RANDOM  Final   Special Requests NONE  Final   Culture NO GROWTH  Final   Report Status 11/16/2016 FINAL  Final  MRSA PCR Screening     Status: None   Collection Time: 11/15/16  7:28 PM  Result Value Ref Range Status   MRSA by PCR NEGATIVE NEGATIVE Final    Comment:        The GeneXpert MRSA Assay (FDA approved for NASAL specimens only), is one component of a comprehensive MRSA colonization surveillance program. It is not intended to diagnose MRSA infection nor to guide or monitor treatment for MRSA infections.     RADIOLOGY STUDIES/RESULTS: Dg Chest 2 View  Result Date: 11/13/2016 CLINICAL DATA:  Evaluate for pulmonary edema EXAM: CHEST  2 VIEW COMPARISON:  11/04/2016 FINDINGS: Chronic  mild cardiomegaly. There is low volume chest with diffuse interstitial opacity but no Kerley lines or effusion. Current opacities likely related to patient's pulmonary fibrosis as seen on abdominal CT 11/01/2016. No air bronchograms or asymmetric density. No pneumothorax. IMPRESSION: Pulmonary fibrosis.  No evidence of acute superimposed disease. Electronically Signed   By: Marnee Spring M.D.   On: 11/13/2016 18:56   Dg Chest 2  View  Result Date: 11/04/2016 CLINICAL DATA:  Short of breath, swelling in extremities and eyelids. EXAM: CHEST  2 VIEW COMPARISON:  10/16/2016 FINDINGS: Normal cardiac silhouette. There is mild interstitial edema pattern similar to comparison exam. This interstitial pattern is superimposed on chronic interstitial lung disease. No effusion, infiltrate pneumothorax. IMPRESSION: Interstitial edema superimposed on chronic interstitial lung disease. Electronically Signed   By: Genevive BiStewart  Edmunds M.D.   On: 11/04/2016 18:46   [place] Aorta  Result Date: 11/06/2016 CLINICAL DATA:  Abdominal aortic aneurysm. EXAM: ULTRASOUND OF ABDOMINAL AORTA TECHNIQUE: Ultrasound examination of the abdominal aorta was performed to evaluate for abdominal aortic aneurysm. COMPARISON:  CT scan of November 01, 2016. FINDINGS: Abdominal Aorta Infrarenal fusiform abdominal aortic aneurysm is noted with maximum measured transverse diameter of 6.4 cm. Maximum Diameter: 6.4 cm distally. IMPRESSION: 6.4 cm infrarenal abdominal aortic aneurysm. Due to the increased risk of rupture for abdominal aortic aneurysms of this size, vascular surgery consultation is recommended. This recommendation follows ACR consensus guidelines: White Paper of the ACR Incidental Findings Committee II on Vascular Findings. J Am Coll Radiol 2013; 10:789-794. These results will be called to the ordering clinician or representative by the Radiologist Assistant, and communication documented in the PACS or zVision Dashboard. Electronically Signed   By:  Lupita RaiderJames  Green Jr, M.D.   On: 11/06/2016 16:36   Dg Chest Port 1 View  Result Date: 11/15/2016 CLINICAL DATA:  Weakness, shortness of Breath EXAM: PORTABLE CHEST 1 VIEW COMPARISON:  11/13/2016 FINDINGS: Cardiomediastinal silhouette is stable. Stable bilateral reticular mild interstitial prominence and peripheral fibrotic changes. No definite superimposed infiltrate or pulmonary edema. IMPRESSION: Stable bilateral reticular mild interstitial prominence and peripheral fibrotic changes. No definite superimposed infiltrate or pulmonary edema. Electronically Signed   By: Natasha MeadLiviu  Pop M.D.   On: 11/15/2016 13:04     LOS: 1 day   Jeoffrey MassedGHIMIRE,SHANKER, MD  [facility] Pager:[phone]  If 7PM-7AM, please contact night-coverage [email or web address] Password [email or web address] 11/16/2016, 1:26 PM

## 2016-11-16 NOTE — Consult Note (Signed)
Consultation Note Date: 11/16/2016   Patient Name: Christopher [name]  DOB: 1928/04/21  MRN: [id number]  Age / Sex: 83 y.o., male  PCP: Oley Balm. Margo Common, MD Referring Physician: Maretta Bees, MD  Reason for Consultation: Establishing goals of care and Psychosocial/spiritual support  HPI/Patient Profile: 83 y.o. male   admitted on 11/15/2016 with past medical history significant for CAD, hypertension, atrial fibrillation, chronic diastolic CHF, abdominal aortic aneurysm who was recently hospitalized with acute exacerbation of CHF and UTI and discharged home on 11/08/2016.   He has an indwelling foley cath  The patient presented to the hospital with worsening of weakness. His caregiver who is present at bedside said that since the day of discharge patient weak, but was able to get out of the bed and had a decent appetite.   However today when she attempted to lift him from the bed he was profoundly weak and unable to stand started shaking and falling. She called paramedics and patient was delivered to the emergency room for evaluation.   Per caregiver there has been continued physical, functional and cognitive decline the past several months  Patient and family face advanced directive decisions and anticipatory care needs  Clinical Assessment and Goals of Care:  This NP Lorinda Creed reviewed medical records, received report from team, assessed the patient and then meet at the patient's bedside to discuss plan of care.  Patient was unable to discuss, oriented to person only,  poor insight into situation.    I placed call to main caregiver/friend/like family Veatrice Kells to schedule a meeting for today but unalbe to get here until later this evening so we spoke over the phone to discuss diagnosis, prognosis, GOC, EOL wishes disposition and options.  A  discussion was had today regarding advanced directives.   Concepts specific to code status, artifical feeding and hydration, continued IV antibiotics and rehospitalization was had.  The difference between a aggressive medical intervention path  and a palliative comfort care path for this patient at this time was had.  Values and goals of care important to patient and family were attempted to be elicited.   Questions and concerns addressed.   Family encouraged to call with questions or concerns.  PMT will continue to support holistically.   HCPOA is documented and a copy is in the chart naming [name]  # [phone]    SUMMARY OF RECOMMENDATIONS    Code Status/Advance Care Planning:  DNR   Symptom Management:   Weakness: PT as toelrated  Palliative Prophylaxis:   Aspiration, Bowel Regimen, Delirium Protocol, Frequent Pain Assessment, Oral Care, Palliative Wound Care and Turn Reposition  Additional Recommendations (Limitations, Scope, Preferences):  Family remains hopeful for improvement, however they want to consider medical interventions as they arise and make decisions dependent on individual situation  They are not interested in SNF for rehab, they will take him back  home   They are open to hospice services if appropriate on discahrge   Psycho-social/Spiritual:   Desire for further Chaplaincy  support:yes  Additional Recommendations: Education on Hospice  Prognosis:   < 6 months  Discharge Planning: To Be Determined      Primary Diagnoses: Present on Admission: . Sepsis (HCC) . UTI (urinary tract infection)   I have reviewed the medical record, interviewed the patient and family, and examined the patient. The following aspects are pertinent.  Past Medical History:  Diagnosis Date  . AAA (abdominal aortic aneurysm) (HCC)   . BPH (benign prostatic hyperplasia)   . Hemorrhoids   . Hypertension    Social History   Social History  . Marital status: Widowed    Spouse name: N/A  . Number of children: N/A  .  Years of education: N/A   Social History Main Topics  . Smoking status: Former Smoker    Packs/day: 1.00    Years: 20.00    Types: Cigarettes    Quit date: 08/21/1958  . Smokeless tobacco: Never Used     Comment: quit over 40 yrs ago.  . Alcohol use No  . Drug use: No  . Sexual activity: Not Asked   Other Topics Concern  . None   Social History Narrative   Lives in [place] with his nephew.  Retired Visual merchandiser and other odd jobs.   Family History  Problem Relation Age of Onset  . Other      no premature CAD.  Marland Kitchen Congestive Heart Failure Sister    Scheduled Meds: . aspirin EC  81 mg Oral Daily  . atorvastatin  40 mg Oral Daily  . aztreonam  1 g Intravenous Q8H  . cholecalciferol  1,000 Units Oral Daily  . clopidogrel  75 mg Oral Daily  . heparin  5,000 Units Subcutaneous Q8H  . sodium chloride flush  3 mL Intravenous Q12H  . tamsulosin  0.4 mg Oral QHS  . cyanocobalamin  1,000 mcg Oral Daily   Continuous Infusions: . sodium chloride 40 mL/hr at 11/16/16 1035   PRN Meds:.acetaminophen **OR** acetaminophen, albuterol, hydrocerin, ondansetron **OR** ondansetron (ZOFRAN) IV, polyethylene glycol, traZODone Medications Prior to Admission:  Prior to Admission medications   Medication Sig Start Date End Date Taking? Authorizing Provider  aspirin EC 81 MG EC tablet Take 1 tablet (81 mg total) by mouth daily. 11/09/16  Yes Henderson Cloud, MD  atorvastatin (LIPITOR) 40 MG tablet Take 40 mg by mouth daily.   Yes Historical Provider, MD  cholecalciferol (VITAMIN D) 1000 units tablet Take 1,000 Units by mouth daily.   Yes Historical Provider, MD  clopidogrel (PLAVIX) 75 MG tablet Take 1 tablet (75 mg total) by mouth daily. 11/08/16  Yes Henderson Cloud, MD  cyanocobalamin 1000 MCG tablet Take 1,000 mcg by mouth daily.   Yes Historical Provider, MD  enalapril (VASOTEC) 5 MG tablet Take 1 tablet (5 mg total) by mouth at bedtime. 11/08/16  Yes Estela Isaiah Blakes, MD   furosemide (LASIX) 40 MG tablet Take 1 tablet (40 mg total) by mouth daily. 11/13/16 11/20/16 Yes Marily Memos, MD  meclizine (ANTIVERT) 12.5 MG tablet Take 12.5 mg by mouth 3 (three) times daily as needed for dizziness.   Yes Historical Provider, MD  metoprolol succinate (TOPROL-XL) 25 MG 24 hr tablet Take 1 tablet (25 mg total) by mouth daily. 11/08/16  Yes Estela Isaiah Blakes, MD  potassium chloride SA (K-DUR,KLOR-CON) 20 MEQ tablet Take 1 tablet (20 mEq total) by mouth daily. 11/08/16  Yes Estela Isaiah Blakes, MD  PROAIR HFA 108 [phone] Base) MCG/ACT inhaler  Inhale 1-2 puffs into the lungs every 6 (six) hours as needed for wheezing or shortness of breath.  11/01/16  Yes Historical Provider, MD  spironolactone (ALDACTONE) 25 MG tablet Take 1 tablet (25 mg total) by mouth daily. 11/08/16  Yes Estela Isaiah Blakes, MD  tamsulosin (FLOMAX) 0.4 MG CAPS capsule Take 1 capsule (0.4 mg total) by mouth at bedtime. 11/08/16  Yes Estela Isaiah Blakes, MD   Allergies  Allergen Reactions  . Cefuroxime Axetil Hives and Itching   Review of Systems  Neurological: Positive for weakness.    Physical Exam  Constitutional: He appears cachectic. He appears ill.  HENT:  Mouth/Throat: Mucous membranes are dry.  Cardiovascular: Normal rate, regular rhythm and normal heart sounds.   Pulmonary/Chest: He has decreased breath sounds in the right lower field and the left lower field.  Genitourinary:  Genitourinary Comments: Noted indwelling long term cath   Musculoskeletal:  - bilateral lower extremities weakness  Neurological: He is alert.  - oriented to person only  Skin: Skin is warm and dry.    Vital Signs: BP (!) 121/57 (BP Location: Right Arm)   Pulse 63   Temp 98.3 F (36.8 C) (Oral)   Resp (!) 23   Ht  (1.753 m)   Wt 73.6 kg (162 lb 3.2 oz)   SpO2 96%   BMI 23.95 kg/m  Pain Assessment: No/denies pain   Pain Score: 0-No pain   SpO2: SpO2: 96 % O2 Device:SpO2: 96 % O2  Flow Rate: .   IO: Intake/output summary:  Intake/Output Summary (Last 24 hours) at 11/16/16 1410 Last data filed at 11/16/16 1155  Gross per 24 hour  Intake          6423.75 ml  Output             1225 ml  Net          5198.75 ml    LBM: Last BM Date: 11/15/16 Baseline Weight: Weight: 72.6 kg (160 lb) Most recent weight: Weight: 73.6 kg (162 lb 3.2 oz)      Palliative Assessment/Data: 30% at best   Discussed with Dr Jerral Ralph  Time In: 1400 Time Out: 1515 Time Total: 75 min Greater than 50%  of this time was spent counseling and coordinating care related to the above assessment and plan.  Signed by: Lorinda Creed, NP   Please contact Palliative Medicine Team phone at [phone] for questions and concerns.  For individual provider: See Loretha Stapler

## 2016-11-16 NOTE — Evaluation (Signed)
Physical Therapy Evaluation Patient Details Name: Christopher [name] MRN: [id number] DOB: 10-28-1927 Today's Date: 11/16/2016   History of Present Illness  Pt is an 83 y.o. male admitted to [facility] on 11/15/16 for worsening generalized weakness. Pt recently admitted 3/17-3/21 for CHF exacerbation. Pertinent PMH includes recent hernia repair, chronic Foley catheter in place (in place since hernia repair), HTN, BPH, AAA.    Clinical Impression  Pt presents to PT with generalized weakness, decreased awareness, and an overall decrease in functional mobility secondary to above. Pt is unreliable historian, but reports that PTA he was amb household distances with RW and indep with ADLs; per PT note 1 week ago, pt lives with nephew who works, but has a home aide come sit with him during the day. Today, pt able to perform bed mob and stand partially with maxA, requiring totalA to squat pivot to chair. Pt would benefit from continued acute PT services to maximize functional mobility and decrease caregiver burden.     Follow Up Recommendations SNF;Supervision/Assistance - 24 hour    Equipment Recommendations  None recommended by PT    Recommendations for Other Services OT consult     Precautions / Restrictions Precautions Precautions: Fall Restrictions Weight Bearing Restrictions: No      Mobility  Bed Mobility Overal bed mobility: Needs Assistance Bed Mobility: Supine to Sit     Supine to sit: HOB elevated;Max assist     General bed mobility comments: Increased time and maxA to maneuver BLE and raise trunk into sitting; maxA to scoot hips forward to EOB.   Transfers Overall transfer level: Needs assistance Equipment used: Rolling walker (2 wheeled) Transfers: Sit to/from [employer] Sit to Stand: Max assist   Squat pivot transfers: Total assist;+2 physical assistance     General transfer comment: Stood x3 with RW and maxA for pericare; pt unable to stand fully upright,  relying on bed for BLE support; pt with fear of falling and significant posterior lean. Squat pivot to chair with totalA  Ambulation/Gait                Stairs            Wheelchair Mobility    Modified Rankin (Stroke Patients Only)       Balance Overall balance assessment: Needs assistance Sitting-balance support: Bilateral upper extremity supported;Feet supported Sitting balance-Leahy Scale: Poor Sitting balance - Comments: Pt with significant posterior lean in sitting, requiring minA-maxA to maintain balance; 2x instance of pt falling backwards into bed.  Postural control: Posterior lean Standing balance support: Bilateral upper extremity supported Standing balance-Leahy Scale: Zero Standing balance comment: Pt unable to stand fully upright with RW and maxA                             Pertinent Vitals/Pain Pain Assessment: No/denies pain    Home Living Family/patient expects to be discharged to:: Private residence Living Arrangements: Other relatives (Nephew) Available Help at Discharge: Personal care attendant;Available PRN/intermittently;Family (Nephew works full-time; personal care attendant sits with pt during day) Type of Home: House Home Access: Ramped entrance     Home Layout: One level Home Equipment: Environmental consultant - 4 wheels;Cane - single point;Bedside commode;Wheelchair - manual      Prior Function Level of Independence: Needs assistance   Gait / Transfers Assistance Needed: Pt is unreliable historian; reports he is mod indep with RW for household amb  ADL's / Homemaking Assistance Needed: Pt is  unreliable historian; reports indep with dressing and bathing        Hand Dominance        Extremity/Trunk Assessment   Upper Extremity Assessment Upper Extremity Assessment: Generalized weakness    Lower Extremity Assessment Lower Extremity Assessment: Generalized weakness       Communication   Communication: HOH  Cognition  Arousal/Alertness: Awake/alert Behavior During Therapy: WFL for tasks assessed/performed Overall Cognitive Status: No family/caregiver present to determine baseline cognitive functioning Area of Impairment: Orientation;Attention;Memory;Following commands;Awareness;Problem solving                 Orientation Level: Disoriented to;Place;Time;Situation Current Attention Level: Sustained Memory: Decreased short-term memory Following Commands: Follows one step commands with increased time;Follows multi-step commands inconsistently   Awareness: Intellectual Problem Solving: Requires tactile cues;Requires verbal cues;Slow processing General Comments: Pt extremely pleasant, with some confusion during therapy; occasionally mumbling inconherent answers to questions. Unreliable when reporting PLOF and home set-up.       General Comments      Exercises     Assessment/Plan    PT Assessment Patient needs continued PT services  PT Problem List Decreased strength;Decreased activity tolerance;Decreased balance;Decreased mobility;Decreased cognition;Decreased knowledge of precautions       PT Treatment Interventions DME instruction;Gait training;Functional mobility training;Therapeutic activities;Therapeutic exercise;Balance training;Patient/family education;Cognitive remediation    PT Goals (Current goals can be found in the Care Plan section)  Acute Rehab PT Goals Patient Stated Goal: Return home PT Goal Formulation: With patient Time For Goal Achievement: 11/30/16 Potential to Achieve Goals: Fair    Frequency Min 2X/week   Barriers to discharge Decreased caregiver support      Co-evaluation               End of Session Equipment Utilized During Treatment: Gait belt Activity Tolerance: Patient limited by fatigue Patient left: in chair;with call bell/phone within reach;with chair alarm set Nurse Communication: Mobility status PT Visit Diagnosis: Muscle weakness  (generalized) (M62.81);Other abnormalities of gait and mobility (R26.89)    Time: 1353-1420 PT Time Calculation (min) (ACUTE ONLY): 27 min   Charges:   PT Evaluation $PT Eval Low Complexity: 1 Procedure PT Treatments $Therapeutic Activity: 8-22 mins   PT G Codes:       Dewayne HatchJaclyn Leigh Redman, SPT [phone]  Isidor Bromell A Gen Clagg 11/16/2016, 3:22 PM

## 2016-11-16 NOTE — Care Management Note (Signed)
Case Management Note  Patient Details  Name: Lorayne MarekWilbert J Birchmeier MRN: [id number] Date of Birth: 1928-03-27  Subjective/Objective: Pt presented for Sepsis. Pt with increased weakness from home. Pt alert to self at time of visit. No family at bedside at time of conversation to discuss disposition needs.                    Action/Plan: CM will continue to monitor for disposition needs.   Expected Discharge Date:                  Expected Discharge Plan:  Home w Home Health Services  In-House Referral:  NA  Discharge planning Services  CM Consult  Post Acute Care Choice:    Choice offered to:     DME Arranged:    DME Agency:  NA  HH Arranged:    HH Agency:     Status of Service:  In process, will continue to follow  If discussed at Long Length of Stay Meetings, dates discussed:    Additional Comments:  Gala LewandowskyGraves-Bigelow, Dequan Kindred Kaye, RN 11/16/2016, 2:57 PM

## 2016-11-17 ENCOUNTER — Encounter (HOSPITAL_COMMUNITY): Payer: Self-pay | Admitting: *Deleted

## 2016-11-17 DIAGNOSIS — E870 Hyperosmolality and hypernatremia: Secondary | ICD-10-CM

## 2016-11-17 DIAGNOSIS — R627 Adult failure to thrive: Secondary | ICD-10-CM

## 2016-11-17 LAB — CBC
HEMATOCRIT: 36.4 % — AB (ref 39.0–52.0)
Hemoglobin: 10.7 g/dL — ABNORMAL LOW (ref 13.0–17.0)
MCH: 23.2 pg — AB (ref 26.0–34.0)
MCHC: 29.4 g/dL — AB (ref 30.0–36.0)
MCV: 78.8 fL (ref 78.0–100.0)
Platelets: 179 10*3/uL (ref 150–400)
RBC: 4.62 MIL/uL (ref 4.22–5.81)
RDW: 20.9 % — AB (ref 11.5–15.5)
WBC: 6.7 10*3/uL (ref 4.0–10.5)

## 2016-11-17 LAB — BASIC METABOLIC PANEL
Anion gap: 8 (ref 5–15)
BUN: 42 mg/dL — AB (ref 6–20)
CALCIUM: 8.1 mg/dL — AB (ref 8.9–10.3)
CO2: 19 mmol/L — ABNORMAL LOW (ref 22–32)
CREATININE: 1.25 mg/dL — AB (ref 0.61–1.24)
Chloride: 122 mmol/L — ABNORMAL HIGH (ref 101–111)
GFR calc non Af Amer: 50 mL/min — ABNORMAL LOW (ref >60)
GFR, EST AFRICAN AMERICAN: 58 mL/min — AB (ref >60)
Glucose, Bld: 90 mg/dL (ref 65–99)
Potassium: 3.6 mmol/L (ref 3.5–5.1)
Sodium: 149 mmol/L — ABNORMAL HIGH (ref 135–145)

## 2016-11-17 NOTE — Progress Notes (Signed)
ANTIBIOTIC CONSULT NOTE   Pharmacy Consult for Aztreonam Indication: UTI  Allergies  Allergen Reactions  . Cefuroxime Axetil Hives and Itching    Patient Measurements: Height:  (175.3 cm) Weight: 160 lb 3.2 oz (72.7 kg) IBW/kg (Calculated) : 70.7 Adjusted Body Weight:    Vital Signs: Temp: 98.7 F (37.1 C) (03/30 0732) Temp Source: Oral (03/30 0732) BP: 129/61 (03/30 0732) Pulse Rate: 58 (03/30 0732) Intake/Output from previous day: 03/29 0701 - 03/30 0700 In: 473 [P.O.:420; I.V.:3; IV Piggyback:50] Out: 800 [Urine:800] Intake/Output from this shift: No intake/output data recorded.  Labs:  Recent Labs  11/15/16 1221  11/16/16 0551 11/16/16 1042 11/17/16 0502  WBC 8.9  --  7.3  --  6.7  HGB 13.4  --  10.7*  --  10.7*  PLT 274  --  193  --  179  CREATININE 2.07*  < > 1.38* 1.37* 1.25*  < > = values in this interval not displayed. Estimated Creatinine Clearance: 42.4 mL/min (A) (by C-G formula based on SCr of 1.25 mg/dL (H)). No results for input(s): VANCOTROUGH, VANCOPEAK, VANCORANDOM, GENTTROUGH, GENTPEAK, GENTRANDOM, TOBRATROUGH, TOBRAPEAK, TOBRARND, AMIKACINPEAK, AMIKACINTROU, AMIKACIN in the last 72 hours.   Microbiology:   Medical History: Past Medical History:  Diagnosis Date  . AAA (abdominal aortic aneurysm) (HCC)   . BPH (benign prostatic hyperplasia)   . Hemorrhoids   . Hypertension    Assessment: Christopher [name] is a 83 y.o. male admitted on 11/15/2016 with sepsis.  Pharmacy has been consulted for levofloxacin/aztreonam dosing. Also has vancomycin ordered x1 per EDP. Afebrile, WBC wnl. SCr 2.07 on admit (SCr 1.3-1.5 during last week), CrCl~25. Noted hx of allergy to cefuroxime of hives, itching - no history of cephalosporin/PCN use in Epic.  PMH: dHF, afib, AAA, h/o multiple hospitalizations, hernia repair (with catheter in place), CAD, HTN  Significant events:   Seen in the emergency department 3/26 for generalized weakness. Dehydration was  suspected. Given hydration with noted improvement. Discharged home.  Admitted 3/17-3/21 for acute on chronic diastolic CHF, diuresed 16 L at that time.   ID: Sepsis from catheter associated UTI. Afebrile. WBC 6.7. Scr 1.25 down. Lactic acid 2.51 >> 1.71 (3/28). "UA grossly infected " on admit note.  3/28 Vanco x 1 in ED 3/28 Levaquin>3/29 3/28 Aztreonam>>  3/28: Ucx: no growth.  Goal of Therapy:  Eradication of infection   Plan:  D/c Levaquin 3/29. Aztreonam 1g IV q8hr. Dose remains ok.  Christopher [name], PharmD, [facility] Clinical Staff Pharmacist Pager [phone]  Christopher [name] 11/17/2016,7:56 AM

## 2016-11-17 NOTE — Progress Notes (Signed)
Visited with patient per spiritual Care consult.  Patient was sitting up in chair and very alert.  Patient said he was feeling so, so today. Patient said he appreciated visit and would welcome a return when we could.  I asked patient if he would like to talk a little he indicated he was a little sleepy and want to rest.   Provided ministry of presence.  Chaplain available as needed.

## 2016-11-17 NOTE — Evaluation (Signed)
Occupational Therapy Evaluation Patient Details Name: Christopher [name] MRN: [id number] DOB: July 14, 1928 Today's Date: 11/17/2016    History of Present Illness Pt is an 83 y.o. male admitted to [facility] on 11/15/16 for worsening generalized weakness. Pt recently admitted 3/17-3/21 for CHF exacerbation. Pertinent PMH includes recent hernia repair, chronic Foley catheter in place (in place since hernia repair), HTN, BPH, AAA.     Clinical Impression   Per pt; he was independent with ADL PTA, unsure of accuracy of pt report. Currently pt requires max assist for stand pivot transfer, min assist for UB ADL in sitting, and max assist for LB ADL. Pt presenting with impaired cognition, impaired balance, generalized weakness, deconditioning, decreased activity tolerance impacting his independence and safety with ADL and functional mobility. Recommending SNF for follow up to maximize independence and safety with ADL and functional mobility prior to return home. Pt would benefit from continued skilled OT to address established goals.    Follow Up Recommendations  SNF;Supervision/Assistance - 24 hour    Equipment Recommendations  Other (comment) (TBD at next venue)    Recommendations for Other Services       Precautions / Restrictions Precautions Precautions: Fall Restrictions Weight Bearing Restrictions: No      Mobility Bed Mobility Overal bed mobility: Needs Assistance Bed Mobility: Supine to Sit     Supine to sit: Mod assist     General bed mobility comments: Consistent cues for sequencing. Assist for LEs to EOB and trunk elevation but pt assisting with UEs to pull into sitting  Transfers Overall transfer level: Needs assistance Equipment used: 1 person hand held assist Transfers: Sit to/from Stand;Stand Pivot Transfers Sit to Stand: Max assist Stand pivot transfers: Max assist       General transfer comment: Pt able to perform sit to stand from EOB x2 with max assist and bil knees  blocked. Required cues for sequencing of stand pivot transfer to chair with max assist.    Balance Overall balance assessment: Needs assistance Sitting-balance support: Feet supported;No upper extremity supported Sitting balance-Leahy Scale: Fair     Standing balance support: Bilateral upper extremity supported Standing balance-Leahy Scale: Poor                             ADL either performed or assessed with clinical judgement   ADL Overall ADL's : Needs assistance/impaired     Grooming: Minimal assistance;Sitting   Upper Body Bathing: Minimal assistance;Sitting   Lower Body Bathing: Maximal assistance;Sit to/from stand   Upper Body Dressing : Minimal assistance;Sitting   Lower Body Dressing: Maximal assistance;Sit to/from stand   Toilet Transfer: Maximal Chartered loss adjuster Details (indicate cue type and reason): Simulated by transfer from EOB >chair         Functional mobility during ADLs: Maximal assistance (for stand pivot only)       Vision         Perception     Praxis      Pertinent Vitals/Pain Pain Assessment: Faces Faces Pain Scale: No hurt     Hand Dominance     Extremity/Trunk Assessment Upper Extremity Assessment Upper Extremity Assessment: Generalized weakness   Lower Extremity Assessment Lower Extremity Assessment: Defer to PT evaluation   Cervical / Trunk Assessment Cervical / Trunk Assessment: Kyphotic   Communication Communication Communication: HOH   Cognition Arousal/Alertness: Awake/alert Behavior During Therapy: WFL for tasks assessed/performed Overall Cognitive Status: No family/caregiver present to determine baseline cognitive functioning Area  of Impairment: Orientation;Attention;Memory;Following commands;Awareness;Problem solving                 Orientation Level: Disoriented to;Place;Time;Situation Current Attention Level: Sustained Memory: Decreased short-term  memory Following Commands: Follows one step commands consistently   Awareness: Intellectual Problem Solving: Decreased initiation;Difficulty sequencing;Requires verbal cues;Requires tactile cues General Comments: Pt requires consistent verbal and tactile cues to complete task but is very pleasant and agreeable to participate.   General Comments       Exercises     Shoulder Instructions      Home Living Family/patient expects to be discharged to:: Private residence Living Arrangements: Other (Comment) (nephew) Available Help at Discharge: Family;Available PRN/intermittently (nephew) Type of Home: House Home Access: Ramped entrance     Home Layout: One level     Bathroom Shower/Tub: Producer, television/film/video: Standard     Home Equipment: Environmental consultant - 4 wheels;Cane - single point;Bedside commode;Wheelchair - manual   Additional Comments: Information provided by pt; unsure of accuracy.      Prior Functioning/Environment Level of Independence: Needs assistance  Gait / Transfers Assistance Needed: Pt is unreliable historian; reports he is mod indep with RW for household amb ADL's / Homemaking Assistance Needed: Pt is unreliable historian; reports indep with dressing and bathing            OT Problem List: Decreased strength;Decreased activity tolerance;Impaired balance (sitting and/or standing);Decreased cognition;Decreased safety awareness;Decreased knowledge of use of DME or AE      OT Treatment/Interventions: Self-care/ADL training;Therapeutic exercise;Energy conservation;DME and/or AE instruction;Therapeutic activities;Patient/family education;Balance training    OT Goals(Current goals can be found in the care plan section) Acute Rehab OT Goals Patient Stated Goal: none stated OT Goal Formulation: With patient Time For Goal Achievement: 12/01/16 Potential to Achieve Goals: Good ADL Goals Pt Will Perform Grooming: with supervision;sitting Pt Will Perform Upper  Body Bathing: with supervision;sitting Pt Will Perform Lower Body Bathing: with min assist;sit to/from stand Pt Will Transfer to Toilet: with min assist;ambulating;bedside commode (over toilet) Additional ADL Goal #1: Pt will attend to ADL x10 minutes with min verbal cues in a minimally distracting environment.  OT Frequency: Min 2X/week   Barriers to D/C:            Co-evaluation              End of Session Equipment Utilized During Treatment: Gait belt Nurse Communication: Mobility status  Activity Tolerance: Patient tolerated treatment well Patient left: in chair;with call bell/phone within reach;with chair alarm set  OT Visit Diagnosis: Other abnormalities of gait and mobility (R26.89);Muscle weakness (generalized) (M62.81)                Time: 1610-9604 OT Time Calculation (min): 17 min Charges:  OT General Charges $OT Visit: 1 Procedure OT Evaluation $OT Eval Moderate Complexity: 1 Procedure G-Codes:     Bessye Stith A. Brett Albino, M.S., OTR/L Pager: [phone]  Gaye Alken 11/17/2016, 11:50 AM

## 2016-11-17 NOTE — Progress Notes (Signed)
PROGRESS NOTE        PATIENT DETAILS Name: Christopher [name] Age: 83 y.o. Sex: male Date of Birth: 12/21/1927 Admit Date: 11/15/2016 Admitting Physician Standley Brooking, MD WJX:BJYNWG,NFAOZ B, MD  Brief Narrative: Patient is a 83 y.o. male known history of diastolic heart failure, no history of A. fib not on anticoagulation, AAA followed by vascular surgery, recent hernia repair, chronic Foley catheter in place (in place since hernia repair) brought to the hospital with worsening generalized weakness, poor appetite. Thought to have sepsis secondary to catheter-related UTI, and admitted to the hospitalist service. See below for further details  Subjective: Still very weak and frail. "I'm here doc"  ROS: No chest pain No shortness of breath No nausea No abdominal pain   Assessment/Plan: Sepsis secondary to catheter associated UTI: Sepsis pathophysiology has resolved-although clinically improved, he remains very frail and weak. Urine culture is negative, blood cultures negative so far. Will discontinue antimicrobial therapy and monitor off antimicrobials.  Hypernatremia: Likely due to dehydration-diuretic use. Hydrated gently overnight with 0.45 normal saline-sodium appears essentially the same. He appears to have some rales on exam this morning. I will discontinue IV fluids and encourage oral hydration. If diuretics are needed, will use metolazone-will help decreased Na levels.  History of urinary retention: Apparently occurred after hernia surgery done on Feb 26 at [facility] still has a Foley catheter in place, spoke to his caretaker on 3/29-he is an appointment for a outpatient voiding trial with urology. Given very poor overall prognoses-potential to transition to hospice care-would continue Foley catheter for comfort while inpatient. Continue Flomax. If he improves, he should keep appointment with urology for outpatient voiding trial.  Acute kidney  injury: Secondary mild hemodynamically mediated kidney injury and improving with gentle hydration.  Hypertension: Continue to hold antihypertensives today-suspect could resume over the next few days.  Chronic diastolic heart failure: Compensated-but with a few rales on exam-Continue to hold diuretics until sodium improves further.  Atrial fibrillation: Chronic issue, currently rate controlled without the use of any rate limiting agents. Per chart review, anticoagulation has been stopped due to recent hematochezia, increased bleeding risk.  AAA (6.4 cm infrarenal abdominal aortic aneurysm): Recently seen by vascular surgery as an outpatient, with plans for further discussion with family. Patient is very elderly and frail-doubt he will be able to tolerate any major intervention at this time.  Failure to thrive syndrome/generalized weakness: Spoke at length with patient's caregiver over the phone on 3/29-patient has had a steady decline since hernia surgery-he is mostly bedbound and nonambulatory for the past 2-3 weeks. His appetite is very poor. Family/caregiver understands overall poor prognoses-have consulted Palliative care-family meeting being scheduled-suspect will be appropriate for discharge with hospice either home or to SNF. DNR in place. Await further recommendations from palliative care   DVT Prophylaxis: Prophylactic Lovenox   Code Status:  DNR  Family Communication: None at bedside  Disposition Plan: Remain inpatient-likely home with hospice or SNF with hospice-await Palliative care input  Antimicrobial agents: Anti-infectives    Start     Dose/Rate Route Frequency Ordered Stop   11/17/16 1300  levofloxacin (LEVAQUIN) IVPB 500 mg  Status:  Discontinued     500 mg 100 mL/hr over 60 Minutes Intravenous Every 48 hours 11/15/16 1331 11/16/16 1332   11/16/16 1430  aztreonam (AZACTAM) 1 g in dextrose 5 % 50  mL IVPB     1 g 100 mL/hr over 30 Minutes Intravenous Every 8 hours  11/16/16 1335     11/15/16 2100  aztreonam (AZACTAM) 1 g in dextrose 5 % 50 mL IVPB  Status:  Discontinued     1 g 100 mL/hr over 30 Minutes Intravenous Every 8 hours 11/15/16 1331 11/15/16 1821   11/15/16 1400  vancomycin (VANCOCIN) IVPB 750 mg/150 ml premix     750 mg 150 mL/hr over 60 Minutes Intravenous  Once 11/15/16 1346 11/15/16 1535   11/15/16 1400  vancomycin (VANCOCIN) IVPB 1000 mg/200 mL premix  Status:  Discontinued     1,000 mg 200 mL/hr over 60 Minutes Intravenous  Once 11/15/16 1346 11/16/16 1135   11/15/16 1315  vancomycin (VANCOCIN) 1,750 mg in sodium chloride 0.9 % 500 mL IVPB  Status:  Discontinued     1,750 mg 250 mL/hr over 120 Minutes Intravenous  Once 11/15/16 1312 11/15/16 1346   11/15/16 1300  vancomycin (VANCOCIN) IVPB 1000 mg/200 mL premix  Status:  Discontinued     1,000 mg 200 mL/hr over 60 Minutes Intravenous  Once 11/15/16 1248 11/15/16 1412   11/15/16 1300  levofloxacin (LEVAQUIN) IVPB 750 mg     750 mg 100 mL/hr over 90 Minutes Intravenous  Once 11/15/16 1252 11/15/16 1442   11/15/16 1300  aztreonam (AZACTAM) 2 g in dextrose 5 % 50 mL IVPB     2 g 100 mL/hr over 30 Minutes Intravenous  Once 11/15/16 1252 11/15/16 1345      Procedures: None  CONSULTS:  None  Time spent: 25- minutes-Greater than 50% of this time was spent in counseling, explanation of diagnosis, planning of further management, and coordination of care.  MEDICATIONS: Scheduled Meds: . aspirin EC  81 mg Oral Daily  . atorvastatin  40 mg Oral Daily  . aztreonam  1 g Intravenous Q8H  . cholecalciferol  1,000 Units Oral Daily  . clopidogrel  75 mg Oral Daily  . heparin  5,000 Units Subcutaneous Q8H  . sodium chloride flush  3 mL Intravenous Q12H  . tamsulosin  0.4 mg Oral QHS  . cyanocobalamin  1,000 mcg Oral Daily   Continuous Infusions:  PRN Meds:.acetaminophen **OR** acetaminophen, albuterol, hydrocerin, ondansetron **OR** ondansetron (ZOFRAN) IV, polyethylene glycol,  traZODone   PHYSICAL EXAM: Vital signs: Vitals:   11/17/16 0016 11/17/16 0419 11/17/16 0732 11/17/16 1153  BP: (!) 133/51 (!) 111/54 129/61 117/84  Pulse: (!) 47 (!) 50 (!) 58 70  Resp:   (!) 25 (!) 26  Temp: 97.6 F (36.4 C) 97.9 F (36.6 C) 98.7 F (37.1 C) 97.6 F (36.4 C)  TempSrc: Oral Oral Oral Oral  SpO2: 96% 93% 99% 98%  Weight:  72.7 kg (160 lb 3.2 oz)    Height:       Filed Weights   11/15/16 1846 11/16/16 0420 11/17/16 0419  Weight: 72.2 kg (159 lb 1.6 oz) 73.6 kg (162 lb 3.2 oz) 72.7 kg (160 lb 3.2 oz)   Body mass index is 23.66 kg/m.   General appearance :Awake, alert-lying comfortably in bed Eyes:, pupils equally reactive to light and accomodation,no scleral icterus. HEENT: Atraumatic and Normocephalic Neck: supple, no JVD. No cervical lymphadenopathy.  Resp:Good air entry bilaterally-few bibasilar rales today. No rhonchi CVS: S1 S2 regular GI: Bowel sounds present, Non tender and not distended with no gaurding, rigidity or rebound. Extremities: B/L Lower Ext shows no edema, both legs are warm to touch Neurology:  speech clear,Non focal,  sensation is grossly intact. Musculoskeletal:No digital cyanosis Wounds:N/A  I have personally reviewed following labs and imaging studies  LABORATORY DATA: CBC:  Recent Labs Lab 11/13/16 1838 11/15/16 1221 11/16/16 0551 11/17/16 0502  WBC 7.2 8.9 7.3 6.7  NEUTROABS  --  6.8 5.0  --   HGB 13.9 13.4 10.7* 10.7*  HCT 45.3 44.7 36.1* 36.4*  MCV 78.6 79.7 79.2 78.8  PLT 272 274 193 179    Basic Metabolic Panel:  Recent Labs Lab 11/15/16 1221 11/16/16 0003 11/16/16 0551 11/16/16 1042 11/17/16 0502  NA 150* 149* 150* 149* 149*  K 4.5 3.8 3.7 3.7 3.6  CL 119* 122* 119* 120* 122*  CO2 18* 20* 18* 19* 19*  GLUCOSE 145* 123* 99 100* 90  BUN 73* 62* 56* 53* 42*  CREATININE 2.07* 1.50* 1.38* 1.37* 1.25*  CALCIUM 8.7* 7.6* 8.1* 8.0* 8.1*    GFR: Estimated Creatinine Clearance: 42.4 mL/min (A) (by C-G  formula based on SCr of 1.25 mg/dL (H)).  Liver Function Tests:  Recent Labs Lab 11/15/16 1221 11/16/16 0551  AST 48* 37  ALT 31 24  ALKPHOS 58 46  BILITOT 0.9 0.5  PROT 7.6 5.6*  ALBUMIN 3.0* 2.2*   No results for input(s): LIPASE, AMYLASE in the last 168 hours. No results for input(s): AMMONIA in the last 168 hours.  Coagulation Profile:  Recent Labs Lab 11/15/16 1850  INR 1.18    Cardiac Enzymes:  Recent Labs Lab 11/13/16 1838  TROPONINI 0.05*    BNP (last 3 results) No results for input(s): PROBNP in the last 8760 hours.  HbA1C: No results for input(s): HGBA1C in the last 72 hours.  CBG:  Recent Labs Lab 11/13/16 1846  GLUCAP 121*    Lipid Profile: No results for input(s): CHOL, HDL, LDLCALC, TRIG, CHOLHDL, LDLDIRECT in the last 72 hours.  Thyroid Function Tests: No results for input(s): TSH, T4TOTAL, FREET4, T3FREE, THYROIDAB in the last 72 hours.  Anemia Panel: No results for input(s): VITAMINB12, FOLATE, FERRITIN, TIBC, IRON, RETICCTPCT in the last 72 hours.  Urine analysis:    Component Value Date/Time   COLORURINE AMBER (A) 11/15/2016 1224   APPEARANCEUR CLOUDY (A) 11/15/2016 1224   LABSPEC 1.019 11/15/2016 1224   PHURINE 5.0 11/15/2016 1224   GLUCOSEU NEGATIVE 11/15/2016 1224   HGBUR LARGE (A) 11/15/2016 1224   BILIRUBINUR NEGATIVE 11/15/2016 1224   KETONESUR NEGATIVE 11/15/2016 1224   PROTEINUR 100 (A) 11/15/2016 1224   UROBILINOGEN 4.0 (H) 12/24/2012 0929   NITRITE NEGATIVE 11/15/2016 1224   LEUKOCYTESUR MODERATE (A) 11/15/2016 1224    Sepsis Labs: Lactic Acid, Venous    Component Value Date/Time   LATICACIDVEN 1.71 11/15/2016 1604    MICROBIOLOGY: Recent Results (from the past 240 hour(s))  Urine culture     Status: None   Collection Time: 11/15/16 12:24 PM  Result Value Ref Range Status   Specimen Description URINE, RANDOM  Final   Special Requests NONE  Final   Culture NO GROWTH  Final   Report Status 11/16/2016  FINAL  Final  Blood Culture (routine x 2)     Status: None (Preliminary result)   Collection Time: 11/15/16 12:41 PM  Result Value Ref Range Status   Specimen Description BLOOD RIGHT HAND  Final   Special Requests BOTTLES DRAWN AEROBIC AND ANAEROBIC 5CC  Final   Culture NO GROWTH 1 DAY  Final   Report Status PENDING  Incomplete  Blood Culture (routine x 2)     Status: None (Preliminary  result)   Collection Time: 11/15/16  1:00 PM  Result Value Ref Range Status   Specimen Description BLOOD LEFT FOREARM  Final   Special Requests BOTTLES DRAWN AEROBIC AND ANAEROBIC 5CC  Final   Culture NO GROWTH 1 DAY  Final   Report Status PENDING  Incomplete  MRSA PCR Screening     Status: None   Collection Time: 11/15/16  7:28 PM  Result Value Ref Range Status   MRSA by PCR NEGATIVE NEGATIVE Final    Comment:        The GeneXpert MRSA Assay (FDA approved for NASAL specimens only), is one component of a comprehensive MRSA colonization surveillance program. It is not intended to diagnose MRSA infection nor to guide or monitor treatment for MRSA infections.     RADIOLOGY STUDIES/RESULTS: Dg Chest 2 View  Result Date: 11/13/2016 CLINICAL DATA:  Evaluate for pulmonary edema EXAM: CHEST  2 VIEW COMPARISON:  11/04/2016 FINDINGS: Chronic mild cardiomegaly. There is low volume chest with diffuse interstitial opacity but no Kerley lines or effusion. Current opacities likely related to patient's pulmonary fibrosis as seen on abdominal CT 11/01/2016. No air bronchograms or asymmetric density. No pneumothorax. IMPRESSION: Pulmonary fibrosis.  No evidence of acute superimposed disease. Electronically Signed   By: Marnee Spring M.D.   On: 11/13/2016 18:56   Dg Chest 2 View  Result Date: 11/04/2016 CLINICAL DATA:  Short of breath, swelling in extremities and eyelids. EXAM: CHEST  2 VIEW COMPARISON:  10/16/2016 FINDINGS: Normal cardiac silhouette. There is mild interstitial edema pattern similar to comparison  exam. This interstitial pattern is superimposed on chronic interstitial lung disease. No effusion, infiltrate pneumothorax. IMPRESSION: Interstitial edema superimposed on chronic interstitial lung disease. Electronically Signed   By: Genevive Bi M.D.   On: 11/04/2016 18:46   US Aorta  Result Date: 11/06/2016 CLINICAL DATA:  Abdominal aortic aneurysm. EXAM: ULTRASOUND OF ABDOMINAL AORTA TECHNIQUE: Ultrasound examination of the abdominal aorta was performed to evaluate for abdominal aortic aneurysm. COMPARISON:  CT scan of November 01, 2016. FINDINGS: Abdominal Aorta Infrarenal fusiform abdominal aortic aneurysm is noted with maximum measured transverse diameter of 6.4 cm. Maximum Diameter: 6.4 cm distally. IMPRESSION: 6.4 cm infrarenal abdominal aortic aneurysm. Due to the increased risk of rupture for abdominal aortic aneurysms of this size, vascular surgery consultation is recommended. This recommendation follows ACR consensus guidelines: White Paper of the ACR Incidental Findings Committee II on Vascular Findings. J Am Coll Radiol 2013; 10:789-794. These results will be called to the ordering clinician or representative by the Radiologist Assistant, and communication documented in the PACS or zVision Dashboard. Electronically Signed   By: Lupita Raider, M.D.   On: 11/06/2016 16:36   Dg Chest Port 1 View  Result Date: 11/15/2016 CLINICAL DATA:  Weakness, shortness of Breath EXAM: PORTABLE CHEST 1 VIEW COMPARISON:  11/13/2016 FINDINGS: Cardiomediastinal silhouette is stable. Stable bilateral reticular mild interstitial prominence and peripheral fibrotic changes. No definite superimposed infiltrate or pulmonary edema. IMPRESSION: Stable bilateral reticular mild interstitial prominence and peripheral fibrotic changes. No definite superimposed infiltrate or pulmonary edema. Electronically Signed   By: Natasha Mead M.D.   On: 11/15/2016 13:04     LOS: 2 days   Jeoffrey Massed, MD  [facility] Pager:[phone]  If 7PM-7AM, please contact night-coverage [email or web address] Password [email or web address] 11/17/2016, 12:09 PM

## 2016-11-18 DIAGNOSIS — E860 Dehydration: Secondary | ICD-10-CM

## 2016-11-18 LAB — BASIC METABOLIC PANEL
Anion gap: 10 (ref 5–15)
BUN: 37 mg/dL — AB (ref 6–20)
CHLORIDE: 123 mmol/L — AB (ref 101–111)
CO2: 18 mmol/L — AB (ref 22–32)
Calcium: 8.1 mg/dL — ABNORMAL LOW (ref 8.9–10.3)
Creatinine, Ser: 1.15 mg/dL (ref 0.61–1.24)
GFR calc Af Amer: >60 mL/min (ref >60)
GFR calc non Af Amer: 56 mL/min — ABNORMAL LOW (ref >60)
Glucose, Bld: 83 mg/dL (ref 65–99)
POTASSIUM: 3.6 mmol/L (ref 3.5–5.1)
Sodium: 151 mmol/L — ABNORMAL HIGH (ref 135–145)

## 2016-11-18 MED ORDER — ACETAMINOPHEN 325 MG PO TABS
650.0000 mg | ORAL_TABLET | Freq: Four times a day (QID) | ORAL | Status: DC | PRN
  Administered 2016-11-20: 650 mg via ORAL
  Filled 2016-11-18: qty 2

## 2016-11-18 MED ORDER — AZTREONAM 1 G IJ SOLR
1.0000 g | Freq: Three times a day (TID) | INTRAMUSCULAR | Status: DC
  Administered 2016-11-18 – 2016-11-20 (×6): 1 g via INTRAVENOUS
  Filled 2016-11-18 (×7): qty 1

## 2016-11-18 MED ORDER — DEXTROSE 5 % IV SOLN
INTRAVENOUS | Status: AC
  Administered 2016-11-18 (×2): 75 mL via INTRAVENOUS

## 2016-11-18 NOTE — Progress Notes (Signed)
PROGRESS NOTE        PATIENT DETAILS Name: Christopher [name] Age: 83 y.o. Sex: male Date of Birth: April 20, 1928 Admit Date: 11/15/2016 Admitting Physician Standley Brooking, MD QIO:[id number] B, MD  Brief Narrative:  Patient is a 83 y.o. male known history of diastolic heart failure, no history of A. fib not on anticoagulation, AAA followed by vascular surgery, recent hernia repair, chronic Foley catheter in place (in place since hernia repair) brought to the hospital with worsening generalized weakness, poor appetite. Thought to have sepsis secondary to catheter-related UTI, and admitted to the hospitalist service. See below for further details  Subjective:  In bed, severely demented and unreliable historian, does not seem to be in any discomfort.  Assessment/Plan:  Sepsis secondary to catheter associated UTI: Sepsis pathophysiology has resolved-although clinically improved, will give him 3 more days of IV Aztreonam other antibiotics have been previously stopped. Follow cultures.  Hypernatremia: Likely due to dehydration-diuretic use. Gentle hydration with D5W and monitor.  History of urinary retention: Apparently occurred after hernia surgery done a few weeks back (Feb 26) at [facility] sure if patient failed a voiding trial, he still has a Foley catheter in place.Continue with Foley catheter and Flomax.  Acute kidney injury: Secondary mild hemodynamically mediated kidney injury and improving with gentle hydration.  Hypertension: Continue to hold antihypertensives today-suspect could resume over the next few days.  Chronic diastolic heart failure EF 55-60%: Compensated-infected dry on my exam today. Continue to hold diuretics until sodium improves further.  Chronic Atrial fibrillation: [place] vasc 2 score of at least 3,  Chronic issue, currently rate controlled without the use of any rate limiting agents. Per chart review, anticoagulation has been  stopped due to recent hematochezia, increased bleeding risk.  AAA (6.4 cm infrarenal abdominal aortic aneurysm): Recently seen by vascular surgery as an outpatient, with plans for further discussion with family. Patient is very elderly and frail-doubt he will be able to tolerate any major intervention at this time.  Advanced dementia. Supportive care.   Failure to thrive syndrome/generalized weakness, advanced dementia with poor baseline quality of life: Spoke at length with patient's nephew [name] on 11/18/16 who is the primary decision maker on 11/18/2016. Patient is DO NOT RESUSCITATE. Plan is gentle medical care. If no improvement in another 24-48 hours full hospice. Palliative care on board.    DVT Prophylaxis: Prophylactic Lovenox   Code Status:  DNR  Family Communication: Spoke with caregiver over phone-she will get in touch with patient's nephew and relay above.  Disposition Plan: Remain inpatient-likely home with hospice  Antimicrobial agents:  Anti-infectives    Start     Dose/Rate Route Frequency Ordered Stop   11/17/16 1300  levofloxacin (LEVAQUIN) IVPB 500 mg  Status:  Discontinued     500 mg 100 mL/hr over 60 Minutes Intravenous Every 48 hours 11/15/16 1331 11/16/16 1332   11/16/16 1430  aztreonam (AZACTAM) 1 g in dextrose 5 % 50 mL IVPB  Status:  Discontinued     1 g 100 mL/hr over 30 Minutes Intravenous Every 8 hours 11/16/16 1335 11/17/16 1221   11/15/16 2100  aztreonam (AZACTAM) 1 g in dextrose 5 % 50 mL IVPB  Status:  Discontinued     1 g 100 mL/hr over 30 Minutes Intravenous Every 8 hours 11/15/16 1331 11/15/16 1821   11/15/16 1400  vancomycin (VANCOCIN)  IVPB 750 mg/150 ml premix     750 mg 150 mL/hr over 60 Minutes Intravenous  Once 11/15/16 1346 11/15/16 1535   11/15/16 1400  vancomycin (VANCOCIN) IVPB 1000 mg/200 mL premix  Status:  Discontinued     1,000 mg 200 mL/hr over 60 Minutes Intravenous  Once 11/15/16 1346 11/16/16 1135   11/15/16 1315   vancomycin (VANCOCIN) 1,750 mg in sodium chloride 0.9 % 500 mL IVPB  Status:  Discontinued     1,750 mg 250 mL/hr over 120 Minutes Intravenous  Once 11/15/16 1312 11/15/16 1346   11/15/16 1300  vancomycin (VANCOCIN) IVPB 1000 mg/200 mL premix  Status:  Discontinued     1,000 mg 200 mL/hr over 60 Minutes Intravenous  Once 11/15/16 1248 11/15/16 1412   11/15/16 1300  levofloxacin (LEVAQUIN) IVPB 750 mg     750 mg 100 mL/hr over 90 Minutes Intravenous  Once 11/15/16 1252 11/15/16 1442   11/15/16 1300  aztreonam (AZACTAM) 2 g in dextrose 5 % 50 mL IVPB     2 g 100 mL/hr over 30 Minutes Intravenous  Once 11/15/16 1252 11/15/16 1345      Procedures: None  CONSULTS:  None  Time spent: 25- minutes-Greater than 50% of this time was spent in counseling, explanation of diagnosis, planning of further management, and coordination of care.  MEDICATIONS: Scheduled Meds: . aspirin EC  81 mg Oral Daily  . atorvastatin  40 mg Oral Daily  . cholecalciferol  1,000 Units Oral Daily  . clopidogrel  75 mg Oral Daily  . heparin  5,000 Units Subcutaneous Q8H  . sodium chloride flush  3 mL Intravenous Q12H  . tamsulosin  0.4 mg Oral QHS  . cyanocobalamin  1,000 mcg Oral Daily   Continuous Infusions: . dextrose 75 mL (11/18/16 0656)   PRN Meds:.acetaminophen, albuterol, hydrocerin, [DISCONTINUED] ondansetron **OR** ondansetron (ZOFRAN) IV, polyethylene glycol, traZODone   PHYSICAL EXAM: Vital signs: Vitals:   11/18/16 0817 11/18/16 0847 11/18/16 0917 11/18/16 0947  BP: (!) 134/56 136/61 (!) 134/49 (!) 143/73  Pulse: (!) 45 (!) 49 (!) 45 (!) 51  Resp: (!) 26 17 (!) 23 (!) 25  Temp:      TempSrc:      SpO2: 95% 100% 100% 94%  Weight:      Height:       Filed Weights   11/16/16 0420 11/17/16 0419 11/18/16 0410  Weight: 73.6 kg (162 lb 3.2 oz) 72.7 kg (160 lb 3.2 oz) 69.8 kg (153 lb 14.4 oz)   Body mass index is 22.73 kg/m.   General appearance :Awake, alert, not in any distress-but is  very frail and chronically sick appearing. Eyes:, pupils equally reactive to light and accomodation,no scleral icterus. HEENT: Atraumatic and Normocephalic Neck: supple, no JVD. No cervical lymphadenopathy.  Resp:Good air entry bilaterally CVS: S1 S2 regular GI: Bowel sounds present, Non tender and not distended with no gaurding, rigidity or rebound. Extremities: B/L Lower Ext shows no edema, both legs are warm to touch Neurology:  speech clear,Non focal, sensation is grossly intact. Musculoskeletal:No digital cyanosis Wounds:N/A  I have personally reviewed following labs and imaging studies  LABORATORY DATA: CBC:  Recent Labs Lab 11/13/16 1838 11/15/16 1221 11/16/16 0551 11/17/16 0502  WBC 7.2 8.9 7.3 6.7  NEUTROABS  --  6.8 5.0  --   HGB 13.9 13.4 10.7* 10.7*  HCT 45.3 44.7 36.1* 36.4*  MCV 78.6 79.7 79.2 78.8  PLT 272 274 193 179    Basic Metabolic Panel:  Recent Labs Lab 11/16/16 0003 11/16/16 0551 11/16/16 1042 11/17/16 0502 11/18/16 0255  NA 149* 150* 149* 149* 151*  K 3.8 3.7 3.7 3.6 3.6  CL 122* 119* 120* 122* 123*  CO2 20* 18* 19* 19* 18*  GLUCOSE 123* 99 100* 90 83  BUN 62* 56* 53* 42* 37*  CREATININE 1.50* 1.38* 1.37* 1.25* 1.15  CALCIUM 7.6* 8.1* 8.0* 8.1* 8.1*    GFR: Estimated Creatinine Clearance: 45.5 mL/min (by C-G formula based on SCr of 1.15 mg/dL).  Liver Function Tests:  Recent Labs Lab 11/15/16 1221 11/16/16 0551  AST 48* 37  ALT 31 24  ALKPHOS 58 46  BILITOT 0.9 0.5  PROT 7.6 5.6*  ALBUMIN 3.0* 2.2*   No results for input(s): LIPASE, AMYLASE in the last 168 hours. No results for input(s): AMMONIA in the last 168 hours.  Coagulation Profile:  Recent Labs Lab 11/15/16 1850  INR 1.18    Cardiac Enzymes:  Recent Labs Lab 11/13/16 1838  TROPONINI 0.05*    BNP (last 3 results) No results for input(s): PROBNP in the last 8760 hours.  HbA1C: No results for input(s): HGBA1C in the last 72 hours.  CBG:  Recent  Labs Lab 11/13/16 1846  GLUCAP 121*    Lipid Profile: No results for input(s): CHOL, HDL, LDLCALC, TRIG, CHOLHDL, LDLDIRECT in the last 72 hours.  Thyroid Function Tests: No results for input(s): TSH, T4TOTAL, FREET4, T3FREE, THYROIDAB in the last 72 hours.  Anemia Panel: No results for input(s): VITAMINB12, FOLATE, FERRITIN, TIBC, IRON, RETICCTPCT in the last 72 hours.  Urine analysis:    Component Value Date/Time   COLORURINE AMBER (A) 11/15/2016 1224   APPEARANCEUR CLOUDY (A) 11/15/2016 1224   LABSPEC 1.019 11/15/2016 1224   PHURINE 5.0 11/15/2016 1224   GLUCOSEU NEGATIVE 11/15/2016 1224   HGBUR LARGE (A) 11/15/2016 1224   BILIRUBINUR NEGATIVE 11/15/2016 1224   KETONESUR NEGATIVE 11/15/2016 1224   PROTEINUR 100 (A) 11/15/2016 1224   UROBILINOGEN 4.0 (H) 12/24/2012 0929   NITRITE NEGATIVE 11/15/2016 1224   LEUKOCYTESUR MODERATE (A) 11/15/2016 1224    Sepsis Labs: Lactic Acid, Venous    Component Value Date/Time   LATICACIDVEN 1.71 11/15/2016 1604    MICROBIOLOGY: Recent Results (from the past 240 hour(s))  Urine culture     Status: None   Collection Time: 11/15/16 12:24 PM  Result Value Ref Range Status   Specimen Description URINE, RANDOM  Final   Special Requests NONE  Final   Culture NO GROWTH  Final   Report Status 11/16/2016 FINAL  Final  Blood Culture (routine x 2)     Status: None (Preliminary result)   Collection Time: 11/15/16 12:41 PM  Result Value Ref Range Status   Specimen Description BLOOD RIGHT HAND  Final   Special Requests BOTTLES DRAWN AEROBIC AND ANAEROBIC 5CC  Final   Culture NO GROWTH 2 DAYS  Final   Report Status PENDING  Incomplete  Blood Culture (routine x 2)     Status: None (Preliminary result)   Collection Time: 11/15/16  1:00 PM  Result Value Ref Range Status   Specimen Description BLOOD LEFT FOREARM  Final   Special Requests BOTTLES DRAWN AEROBIC AND ANAEROBIC 5CC  Final   Culture NO GROWTH 2 DAYS  Final   Report Status  PENDING  Incomplete  MRSA PCR Screening     Status: None   Collection Time: 11/15/16  7:28 PM  Result Value Ref Range Status   MRSA by PCR NEGATIVE NEGATIVE  Final    Comment:        The GeneXpert MRSA Assay (FDA approved for NASAL specimens only), is one component of a comprehensive MRSA colonization surveillance program. It is not intended to diagnose MRSA infection nor to guide or monitor treatment for MRSA infections.     RADIOLOGY STUDIES/RESULTS: Dg Chest 2 View  Result Date: 11/13/2016 CLINICAL DATA:  Evaluate for pulmonary edema EXAM: CHEST  2 VIEW COMPARISON:  11/04/2016 FINDINGS: Chronic mild cardiomegaly. There is low volume chest with diffuse interstitial opacity but no Kerley lines or effusion. Current opacities likely related to patient's pulmonary fibrosis as seen on abdominal CT 11/01/2016. No air bronchograms or asymmetric density. No pneumothorax. IMPRESSION: Pulmonary fibrosis.  No evidence of acute superimposed disease. Electronically Signed   By: Marnee Spring M.D.   On: 11/13/2016 18:56   Dg Chest 2 View  Result Date: 11/04/2016 CLINICAL DATA:  Short of breath, swelling in extremities and eyelids. EXAM: CHEST  2 VIEW COMPARISON:  10/16/2016 FINDINGS: Normal cardiac silhouette. There is mild interstitial edema pattern similar to comparison exam. This interstitial pattern is superimposed on chronic interstitial lung disease. No effusion, infiltrate pneumothorax. IMPRESSION: Interstitial edema superimposed on chronic interstitial lung disease. Electronically Signed   By: Genevive Bi M.D.   On: 11/04/2016 18:46   US Aorta  Result Date: 11/06/2016 CLINICAL DATA:  Abdominal aortic aneurysm. EXAM: ULTRASOUND OF ABDOMINAL AORTA TECHNIQUE: Ultrasound examination of the abdominal aorta was performed to evaluate for abdominal aortic aneurysm. COMPARISON:  CT scan of November 01, 2016. FINDINGS: Abdominal Aorta Infrarenal fusiform abdominal aortic aneurysm is noted with  maximum measured transverse diameter of 6.4 cm. Maximum Diameter: 6.4 cm distally. IMPRESSION: 6.4 cm infrarenal abdominal aortic aneurysm. Due to the increased risk of rupture for abdominal aortic aneurysms of this size, vascular surgery consultation is recommended. This recommendation follows ACR consensus guidelines: White Paper of the ACR Incidental Findings Committee II on Vascular Findings. J Am Coll Radiol 2013; 10:789-794. These results will be called to the ordering clinician or representative by the Radiologist Assistant, and communication documented in the PACS or zVision Dashboard. Electronically Signed   By: Lupita Raider, M.D.   On: 11/06/2016 16:36   Dg Chest Port 1 View  Result Date: 11/15/2016 CLINICAL DATA:  Weakness, shortness of Breath EXAM: PORTABLE CHEST 1 VIEW COMPARISON:  11/13/2016 FINDINGS: Cardiomediastinal silhouette is stable. Stable bilateral reticular mild interstitial prominence and peripheral fibrotic changes. No definite superimposed infiltrate or pulmonary edema. IMPRESSION: Stable bilateral reticular mild interstitial prominence and peripheral fibrotic changes. No definite superimposed infiltrate or pulmonary edema. Electronically Signed   By: Natasha Mead M.D.   On: 11/15/2016 13:04     LOS: 3 days   Signature  Susa Raring K M.D on 11/18/2016 at 10:00 AM  Between 7am to 7pm - Pager - [phone] ( page via [facility], text pages only, please mention full 10 digit call back number).  After 7pm go to [email or web address] - password [facility]

## 2016-11-18 NOTE — Progress Notes (Signed)
ANTIBIOTIC CONSULT NOTE   Pharmacy Consult for Aztreonam Indication: sepsis 2/2 foley cath  Allergies  Allergen Reactions  . Cefuroxime Axetil Hives and Itching    Patient Measurements: Height:  (175.3 cm) Weight: 153 lb 14.4 oz (69.8 kg) IBW/kg (Calculated) : 70.7   Vital Signs: Temp: 97.9 F (36.6 C) (03/31 0810) Temp Source: Oral (03/31 0810) BP: 143/73 (03/31 0947) Pulse Rate: 51 (03/31 0947) Intake/Output from previous day: 03/30 0701 - 03/31 0700 In: 210 [P.O.:210] Out: 1000 [Urine:1000] Intake/Output from this shift: Total I/O In: 240 [P.O.:240] Out: -   Labs:  Recent Labs  11/15/16 1221  11/16/16 0551 11/16/16 1042 11/17/16 0502 11/18/16 0255  WBC 8.9  --  7.3  --  6.7  --   HGB 13.4  --  10.7*  --  10.7*  --   PLT 274  --  193  --  179  --   CREATININE 2.07*  < > 1.38* 1.37* 1.25* 1.15  < > = values in this interval not displayed. Estimated Creatinine Clearance: 45.5 mL/min (by C-G formula based on SCr of 1.15 mg/dL). No results for input(s): VANCOTROUGH, VANCOPEAK, VANCORANDOM, GENTTROUGH, GENTPEAK, GENTRANDOM, TOBRATROUGH, TOBRAPEAK, TOBRARND, AMIKACINPEAK, AMIKACINTROU, AMIKACIN in the last 72 hours.   Microbiology:   Medical History: Past Medical History:  Diagnosis Date  . AAA (abdominal aortic aneurysm) (HCC)   . BPH (benign prostatic hyperplasia)   . Hemorrhoids   . Hypertension    Assessment: 83 y.o. male admitted on 11/15/2016 with sepsis thought to be due to Foley catheter.  Pharmacy has been consulted for aztreonam dosing due to hx of hives to cefuroxime. ABX initially discontinued yesterday but per Dr. Thedore Mins continue for 3 more days of aztreonam therapy. Cultures no growth to date, pt afebrile, WBC wnl.  Plan:  -Aztreonam 1g IV q8h -Monitor renal function  Antimicrobials: 3/28 Vancomycin x 1 3/28 Levaquin>3/29 3/28 Aztreonam>>3/30, 3/31 >> (4/2)  Microbiology: 3/28: UCx: no growth 3/28 BCx: NGTD  [place] Highland,  PharmD PGY-1 Pharmacy Resident Pager: [phone] 11/18/2016

## 2016-11-19 ENCOUNTER — Encounter (HOSPITAL_COMMUNITY): Payer: Self-pay | Admitting: *Deleted

## 2016-11-19 LAB — BASIC METABOLIC PANEL
ANION GAP: 8 (ref 5–15)
BUN: 26 mg/dL — ABNORMAL HIGH (ref 6–20)
CHLORIDE: 119 mmol/L — AB (ref 101–111)
CO2: 19 mmol/L — AB (ref 22–32)
Calcium: 7.9 mg/dL — ABNORMAL LOW (ref 8.9–10.3)
Creatinine, Ser: 0.97 mg/dL (ref 0.61–1.24)
GFR calc non Af Amer: >60 mL/min (ref >60)
Glucose, Bld: 121 mg/dL — ABNORMAL HIGH (ref 65–99)
POTASSIUM: 3.1 mmol/L — AB (ref 3.5–5.1)
SODIUM: 146 mmol/L — AB (ref 135–145)

## 2016-11-19 LAB — MAGNESIUM: MAGNESIUM: 2.2 mg/dL (ref 1.7–2.4)

## 2016-11-19 MED ORDER — POTASSIUM CL IN DEXTROSE 5% 20 MEQ/L IV SOLN
20.0000 meq | INTRAVENOUS | Status: DC
  Administered 2016-11-19 (×2): 20 meq via INTRAVENOUS
  Filled 2016-11-19 (×2): qty 1000

## 2016-11-19 MED ORDER — DEXTROSE 5 % IV SOLN: INTRAVENOUS | Status: DC

## 2016-11-19 NOTE — Progress Notes (Signed)
PROGRESS NOTE        PATIENT DETAILS Name: Christopher [name] Age: 83 y.o. Sex: male Date of Birth: 1928/07/12 Admit Date: 11/15/2016 Admitting Physician Standley Brooking, MD ZOX:[id number] B, MD  Brief Narrative:  Patient is a 83 y.o. male known history of diastolic heart failure, no history of A. fib not on anticoagulation, AAA followed by vascular surgery, recent hernia repair, chronic Foley catheter in place (in place since hernia repair) brought to the hospital with worsening generalized weakness, poor appetite. Thought to have sepsis secondary to catheter-related UTI, and admitted to the hospitalist service. See below for further details  Subjective:   severely demented , nonverbal  Assessment/Plan:  Sepsis secondary to catheter associated UTI: Sepsis pathophysiology has resolved-although clinically improved, will give him 2 more days of IV Aztreonam other antibiotics have been previously stopped. Follow cultures. Urine culture no growth so far. Blood culture no growth so far  Hypernatremia: Likely due to dehydration-diuretic use. Continue hydration with D5W and monitor.  Hypokalemia-replete, check magnesium  History of urinary retention: Apparently occurred after hernia surgery done a few weeks back (Feb 26) at [facility] sure if patient failed a voiding trial, he still has a Foley catheter in place.Continue with Foley catheter and Flomax.  Acute kidney injury: Secondary mild hemodynamically mediated kidney injury and improving with gentle hydration.  Hypertension: Continue to hold antihypertensives today-suspect could resume over the next few days.  Chronic diastolic heart failure EF 55-60%: Compensated-infected dry on my exam today. Continue to hold diuretics until sodium improves further.  Chronic Atrial fibrillation: [place] vasc 2 score of at least 3,  Chronic issue, currently rate controlled without the use of any rate limiting agents.  Per chart review, anticoagulation has been stopped due to recent hematochezia, increased bleeding risk.  AAA (6.4 cm infrarenal abdominal aortic aneurysm): Recently seen by vascular surgery as an outpatient, with plans for further discussion with family. Patient is very elderly and frail-doubt he will be able to tolerate any major intervention at this time.  Advanced dementia. Supportive care.   Failure to thrive syndrome/generalized weakness, advanced dementia with poor baseline quality of life: Spoke at length with patient's nephew [name] on 11/18/16 who is the primary decision maker on 11/18/2016. Patient is DO NOT RESUSCITATE. Plan is gentle medical care. If no improvement in another 24-48 hours full hospice. Palliative care on board.    DVT Prophylaxis: Prophylactic Lovenox   Code Status:  DNR  Family Communication: Spoke with caregiver over phone-she will get in touch with patient's nephew and relay above.  Disposition Plan: Remain inpatient-likely home with hospice  Antimicrobial agents:  Anti-infectives    Start     Dose/Rate Route Frequency Ordered Stop   11/18/16 1100  aztreonam (AZACTAM) 1 g in dextrose 5 % 50 mL IVPB     1 g 100 mL/hr over 30 Minutes Intravenous Every 8 hours 11/18/16 1015 11/21/16 0259   11/17/16 1300  levofloxacin (LEVAQUIN) IVPB 500 mg  Status:  Discontinued     500 mg 100 mL/hr over 60 Minutes Intravenous Every 48 hours 11/15/16 1331 11/16/16 1332   11/16/16 1430  aztreonam (AZACTAM) 1 g in dextrose 5 % 50 mL IVPB  Status:  Discontinued     1 g 100 mL/hr over 30 Minutes Intravenous Every 8 hours 11/16/16 1335 11/17/16 1221   11/15/16 2100  aztreonam (AZACTAM) 1 g in dextrose 5 % 50 mL IVPB  Status:  Discontinued     1 g 100 mL/hr over 30 Minutes Intravenous Every 8 hours 11/15/16 1331 11/15/16 1821   11/15/16 1400  vancomycin (VANCOCIN) IVPB 750 mg/150 ml premix     750 mg 150 mL/hr over 60 Minutes Intravenous  Once 11/15/16 1346 11/15/16 1535    11/15/16 1400  vancomycin (VANCOCIN) IVPB 1000 mg/200 mL premix  Status:  Discontinued     1,000 mg 200 mL/hr over 60 Minutes Intravenous  Once 11/15/16 1346 11/16/16 1135   11/15/16 1315  vancomycin (VANCOCIN) 1,750 mg in sodium chloride 0.9 % 500 mL IVPB  Status:  Discontinued     1,750 mg 250 mL/hr over 120 Minutes Intravenous  Once 11/15/16 1312 11/15/16 1346   11/15/16 1300  vancomycin (VANCOCIN) IVPB 1000 mg/200 mL premix  Status:  Discontinued     1,000 mg 200 mL/hr over 60 Minutes Intravenous  Once 11/15/16 1248 11/15/16 1412   11/15/16 1300  levofloxacin (LEVAQUIN) IVPB 750 mg     750 mg 100 mL/hr over 90 Minutes Intravenous  Once 11/15/16 1252 11/15/16 1442   11/15/16 1300  aztreonam (AZACTAM) 2 g in dextrose 5 % 50 mL IVPB     2 g 100 mL/hr over 30 Minutes Intravenous  Once 11/15/16 1252 11/15/16 1345      Procedures: None  CONSULTS:  None  Time spent: 25- minutes-Greater than 50% of this time was spent in counseling, explanation of diagnosis, planning of further management, and coordination of care.  MEDICATIONS: Scheduled Meds: . aspirin EC  81 mg Oral Daily  . atorvastatin  40 mg Oral Daily  . aztreonam  1 g Intravenous Q8H  . cholecalciferol  1,000 Units Oral Daily  . clopidogrel  75 mg Oral Daily  . heparin  5,000 Units Subcutaneous Q8H  . sodium chloride flush  3 mL Intravenous Q12H  . tamsulosin  0.4 mg Oral QHS  . cyanocobalamin  1,000 mcg Oral Daily   Continuous Infusions:  PRN Meds:.acetaminophen, albuterol, hydrocerin, [DISCONTINUED] ondansetron **OR** ondansetron (ZOFRAN) IV, polyethylene glycol, traZODone   PHYSICAL EXAM: Vital signs: Vitals:   11/19/16 0004 11/19/16 0437 11/19/16 0700 11/19/16 0740  BP: 134/63 (!) 128/49  (!) 136/51  Pulse: (!) 58 (!) 39 82 (!) 50  Resp: (!) Temp: 98.5 F (36.9 C) 97.5 F (36.4 C)  97.7 F (36.5 C)  TempSrc:  Axillary  Axillary  SpO2: 100% 98% 99% 100%  Weight:  70 kg (154 lb 6.4 oz)      Height:       Filed Weights   11/17/16 0419 11/18/16 0410 11/19/16 0437  Weight: 72.7 kg (160 lb 3.2 oz) 69.8 kg (153 lb 14.4 oz) 70 kg (154 lb 6.4 oz)   Body mass index is 22.8 kg/m.   General appearance :Awake, alert, not in any distress-but is very frail and chronically sick appearing. Eyes:, pupils equally reactive to light and accomodation,no scleral icterus. HEENT: Atraumatic and Normocephalic Neck: supple, no JVD. No cervical lymphadenopathy.  Resp:Good air entry bilaterally CVS: S1 S2 regular GI: Bowel sounds present, Non tender and not distended with no gaurding, rigidity or rebound. Extremities: B/L Lower Ext shows no edema, both legs are warm to touch Neurology:  speech clear,Non focal, sensation is grossly intact. Musculoskeletal:No digital cyanosis Wounds:N/A  I have personally reviewed following labs and imaging studies  LABORATORY DATA: CBC:  Recent Labs Lab 11/13/16  1838 11/15/16 1221 11/16/16 0551 11/17/16 0502  WBC 7.2 8.9 7.3 6.7  NEUTROABS  --  6.8 5.0  --   HGB 13.9 13.4 10.7* 10.7*  HCT 45.3 44.7 36.1* 36.4*  MCV 78.6 79.7 79.2 78.8  PLT 272 274 193 179    Basic Metabolic Panel:  Recent Labs Lab 11/16/16 0551 11/16/16 1042 11/17/16 0502 11/18/16 0255 11/19/16 0428  NA 150* 149* 149* 151* 146*  K 3.7 3.7 3.6 3.6 3.1*  CL 119* 120* 122* 123* 119*  CO2 18* 19* 19* 18* 19*  GLUCOSE 99 100* 90 83 121*  BUN 56* 53* 42* 37* 26*  CREATININE 1.38* 1.37* 1.25* 1.15 0.97  CALCIUM 8.1* 8.0* 8.1* 8.1* 7.9*    GFR: Estimated Creatinine Clearance: 54.1 mL/min (by C-G formula based on SCr of 0.97 mg/dL).  Liver Function Tests:  Recent Labs Lab 11/15/16 1221 11/16/16 0551  AST 48* 37  ALT 31 24  ALKPHOS 58 46  BILITOT 0.9 0.5  PROT 7.6 5.6*  ALBUMIN 3.0* 2.2*   No results for input(s): LIPASE, AMYLASE in the last 168 hours. No results for input(s): AMMONIA in the last 168 hours.  Coagulation Profile:  Recent Labs Lab  11/15/16 1850  INR 1.18    Cardiac Enzymes:  Recent Labs Lab 11/13/16 1838  TROPONINI 0.05*    BNP (last 3 results) No results for input(s): PROBNP in the last 8760 hours.  HbA1C: No results for input(s): HGBA1C in the last 72 hours.  CBG:  Recent Labs Lab 11/13/16 1846  GLUCAP 121*    Lipid Profile: No results for input(s): CHOL, HDL, LDLCALC, TRIG, CHOLHDL, LDLDIRECT in the last 72 hours.  Thyroid Function Tests: No results for input(s): TSH, T4TOTAL, FREET4, T3FREE, THYROIDAB in the last 72 hours.  Anemia Panel: No results for input(s): VITAMINB12, FOLATE, FERRITIN, TIBC, IRON, RETICCTPCT in the last 72 hours.  Urine analysis:    Component Value Date/Time   COLORURINE AMBER (A) 11/15/2016 1224   APPEARANCEUR CLOUDY (A) 11/15/2016 1224   LABSPEC 1.019 11/15/2016 1224   PHURINE 5.0 11/15/2016 1224   GLUCOSEU NEGATIVE 11/15/2016 1224   HGBUR LARGE (A) 11/15/2016 1224   BILIRUBINUR NEGATIVE 11/15/2016 1224   KETONESUR NEGATIVE 11/15/2016 1224   PROTEINUR 100 (A) 11/15/2016 1224   UROBILINOGEN 4.0 (H) 12/24/2012 0929   NITRITE NEGATIVE 11/15/2016 1224   LEUKOCYTESUR MODERATE (A) 11/15/2016 1224    Sepsis Labs: Lactic Acid, Venous    Component Value Date/Time   LATICACIDVEN 1.71 11/15/2016 1604    MICROBIOLOGY: Recent Results (from the past 240 hour(s))  Urine culture     Status: None   Collection Time: 11/15/16 12:24 PM  Result Value Ref Range Status   Specimen Description URINE, RANDOM  Final   Special Requests NONE  Final   Culture NO GROWTH  Final   Report Status 11/16/2016 FINAL  Final  Blood Culture (routine x 2)     Status: None (Preliminary result)   Collection Time: 11/15/16 12:41 PM  Result Value Ref Range Status   Specimen Description BLOOD RIGHT HAND  Final   Special Requests BOTTLES DRAWN AEROBIC AND ANAEROBIC 5CC  Final   Culture NO GROWTH 3 DAYS  Final   Report Status PENDING  Incomplete  Blood Culture (routine x 2)     Status:  None (Preliminary result)   Collection Time: 11/15/16  1:00 PM  Result Value Ref Range Status   Specimen Description BLOOD LEFT FOREARM  Final   Special Requests BOTTLES  DRAWN AEROBIC AND ANAEROBIC 5CC  Final   Culture NO GROWTH 3 DAYS  Final   Report Status PENDING  Incomplete  MRSA PCR Screening     Status: None   Collection Time: 11/15/16  7:28 PM  Result Value Ref Range Status   MRSA by PCR NEGATIVE NEGATIVE Final    Comment:        The GeneXpert MRSA Assay (FDA approved for NASAL specimens only), is one component of a comprehensive MRSA colonization surveillance program. It is not intended to diagnose MRSA infection nor to guide or monitor treatment for MRSA infections.     RADIOLOGY STUDIES/RESULTS: Dg Chest 2 View  Result Date: 11/13/2016 CLINICAL DATA:  Evaluate for pulmonary edema EXAM: CHEST  2 VIEW COMPARISON:  11/04/2016 FINDINGS: Chronic mild cardiomegaly. There is low volume chest with diffuse interstitial opacity but no Kerley lines or effusion. Current opacities likely related to patient's pulmonary fibrosis as seen on abdominal CT 11/01/2016. No air bronchograms or asymmetric density. No pneumothorax. IMPRESSION: Pulmonary fibrosis.  No evidence of acute superimposed disease. Electronically Signed   By: Marnee Spring M.D.   On: 11/13/2016 18:56   Dg Chest 2 View  Result Date: 11/04/2016 CLINICAL DATA:  Short of breath, swelling in extremities and eyelids. EXAM: CHEST  2 VIEW COMPARISON:  10/16/2016 FINDINGS: Normal cardiac silhouette. There is mild interstitial edema pattern similar to comparison exam. This interstitial pattern is superimposed on chronic interstitial lung disease. No effusion, infiltrate pneumothorax. IMPRESSION: Interstitial edema superimposed on chronic interstitial lung disease. Electronically Signed   By: Genevive Bi M.D.   On: 11/04/2016 18:46   US Aorta  Result Date: 11/06/2016 CLINICAL DATA:  Abdominal aortic aneurysm. EXAM:  ULTRASOUND OF ABDOMINAL AORTA TECHNIQUE: Ultrasound examination of the abdominal aorta was performed to evaluate for abdominal aortic aneurysm. COMPARISON:  CT scan of November 01, 2016. FINDINGS: Abdominal Aorta Infrarenal fusiform abdominal aortic aneurysm is noted with maximum measured transverse diameter of 6.4 cm. Maximum Diameter: 6.4 cm distally. IMPRESSION: 6.4 cm infrarenal abdominal aortic aneurysm. Due to the increased risk of rupture for abdominal aortic aneurysms of this size, vascular surgery consultation is recommended. This recommendation follows ACR consensus guidelines: White Paper of the ACR Incidental Findings Committee II on Vascular Findings. J Am Coll Radiol 2013; 10:789-794. These results will be called to the ordering clinician or representative by the Radiologist Assistant, and communication documented in the PACS or zVision Dashboard. Electronically Signed   By: Lupita Raider, M.D.   On: 11/06/2016 16:36   Dg Chest Port 1 View  Result Date: 11/15/2016 CLINICAL DATA:  Weakness, shortness of Breath EXAM: PORTABLE CHEST 1 VIEW COMPARISON:  11/13/2016 FINDINGS: Cardiomediastinal silhouette is stable. Stable bilateral reticular mild interstitial prominence and peripheral fibrotic changes. No definite superimposed infiltrate or pulmonary edema. IMPRESSION: Stable bilateral reticular mild interstitial prominence and peripheral fibrotic changes. No definite superimposed infiltrate or pulmonary edema. Electronically Signed   By: Natasha Mead M.D.   On: 11/15/2016 13:04     LOS: 4 days   Netta Cedars M.D on 11/19/2016 at 8:02 AM    After 7pm go to [email or web address] - password [facility]

## 2016-11-19 NOTE — Progress Notes (Signed)
Patient remains very weak, unable to eat diet that has been provided as he has no teeth.Speech consult has been put in for evaluation. Patient will eat jello and loves tomato soup and has been given both. No visitors today, patient has slept most of the day. Good output with foley, no BM this shift. No complaint of pain, very good natured.Call bell in reach.

## 2016-11-20 DIAGNOSIS — R531 Weakness: Secondary | ICD-10-CM

## 2016-11-20 LAB — COMPREHENSIVE METABOLIC PANEL
ALK PHOS: 52 U/L (ref 38–126)
ALT: 27 U/L (ref 17–63)
ANION GAP: 8 (ref 5–15)
AST: 41 U/L (ref 15–41)
Albumin: 2.3 g/dL — ABNORMAL LOW (ref 3.5–5.0)
BUN: 21 mg/dL — ABNORMAL HIGH (ref 6–20)
CALCIUM: 7.9 mg/dL — AB (ref 8.9–10.3)
CO2: 19 mmol/L — ABNORMAL LOW (ref 22–32)
CREATININE: 0.95 mg/dL (ref 0.61–1.24)
Chloride: 117 mmol/L — ABNORMAL HIGH (ref 101–111)
GFR calc non Af Amer: >60 mL/min (ref >60)
Glucose, Bld: 122 mg/dL — ABNORMAL HIGH (ref 65–99)
Potassium: 3.4 mmol/L — ABNORMAL LOW (ref 3.5–5.1)
SODIUM: 144 mmol/L (ref 135–145)
Total Bilirubin: 0.5 mg/dL (ref 0.3–1.2)
Total Protein: 5.6 g/dL — ABNORMAL LOW (ref 6.5–8.1)

## 2016-11-20 LAB — CULTURE, BLOOD (ROUTINE X 2)
CULTURE: NO GROWTH
Culture: NO GROWTH

## 2016-11-20 MED ORDER — POTASSIUM CHLORIDE CRYS ER 20 MEQ PO TBCR
40.0000 meq | EXTENDED_RELEASE_TABLET | Freq: Once | ORAL | Status: AC
  Administered 2016-11-20: 40 meq via ORAL
  Filled 2016-11-20: qty 2

## 2016-11-20 MED ORDER — POTASSIUM CHLORIDE 2 MEQ/ML IV SOLN
INTRAVENOUS | Status: DC
  Administered 2016-11-20 – 2016-11-21 (×2): via INTRAVENOUS
  Filled 2016-11-20 (×3): qty 1000

## 2016-11-20 NOTE — Progress Notes (Signed)
PROGRESS NOTE        PATIENT DETAILS Name: Christopher [name] Age: 83 y.o. Sex: male Date of Birth: [birth year] Admit Date: 11/15/2016 Admitting Physician Standley Brooking, MD WUJ:[id number],NWGNF B, MD  Brief Narrative:  Patient is a 83 y.o. male known history of diastolic heart failure, no history of A. fib not on anticoagulation, AAA followed by vascular surgery, recent hernia repair, chronic Foley catheter in place (in place since hernia repair) brought to the hospital with worsening generalized weakness, poor appetite. Thought to have sepsis secondary to catheter-related UTI, and admitted to the hospitalist service. See below for further details  Subjective:  Patient is more awake and alert today,  Assessment/Plan:  Sepsis secondary to catheter associated UTI: Sepsis pathophysiology has resolved-although clinically improved, patient received a total of 3 days of IV Aztreonam without any significant clinical improvement, urine culture and blood culture no growth so far, will discontinue antibiotics     Hypernatremia: Very slow to improve, Likely due to dehydration-diuretic use. Continue hydration with D5W and monitor. Very slow to improve despite continuous IV fluids. Suspect patient will become hypernatremic and hypokalemic once the fluids that discontinued  Hypokalemia-continue repletion, continues to be low secondary to poor oral intake  History of urinary retention: Apparently occurred after hernia surgery done a few weeks back (Feb 26) at [facility] sure if patient failed a voiding trial, he still has a Foley catheter in place.Continue with Foley catheter and Flomax.  Acute kidney injury: Secondary mild hemodynamically mediated kidney injury and improving with gentle hydration.  Hypertension: Continue to hold antihypertensives , due to poor oral intake  .  Chronic diastolic heart failure EF 55-60%: Compensated-infected dry on my exam today.  Continue to hold diuretics until sodium improves further.  Chronic Atrial fibrillation: [place] vasc 2 score of at least 3,  Chronic issue, currently rate controlled without the use of any rate limiting agents. Per chart review, anticoagulation has been stopped due to recent hematochezia, increased bleeding risk.  AAA (6.4 cm infrarenal abdominal aortic aneurysm): Recently seen by vascular surgery as an outpatient, with plans for further discussion with family. Patient is very elderly and frail-doubt he will be able to tolerate any major intervention at this time.  Advanced dementia. Supportive care. Speech therapy evaluation pending  Failure to thrive syndrome/generalized weakness, advanced dementia with poor baseline quality of life: Spoke at length with patient's nephew Christopher [name] on 11/20/16 who is the primary decision maker on 11/18/2016. Patient is DO NOT RESUSCITATE.  Christopher [name] is agreeable to pursuing home with hospice. Case management consultation has been placed. Patient may be able to discharge, home with hospice once, everything has been arranged    DVT Prophylaxis: Prophylactic Lovenox   Code Status:  DNR  Family Communication: As above  Disposition Plan: Remain inpatient-likely home with hospice  Antimicrobial agents:  Anti-infectives    Start     Dose/Rate Route Frequency Ordered Stop   11/18/16 1100  aztreonam (AZACTAM) 1 g in dextrose 5 % 50 mL IVPB     1 g 100 mL/hr over 30 Minutes Intravenous Every 8 hours 11/18/16 1015 11/21/16 0259   11/17/16 1300  levofloxacin (LEVAQUIN) IVPB 500 mg  Status:  Discontinued     500 mg 100 mL/hr over 60 Minutes Intravenous Every 48 hours 11/15/16 1331 11/16/16 1332   11/16/16 1430  aztreonam (AZACTAM) 1  g in dextrose 5 % 50 mL IVPB  Status:  Discontinued     1 g 100 mL/hr over 30 Minutes Intravenous Every 8 hours 11/16/16 1335 11/17/16 1221   11/15/16 2100  aztreonam (AZACTAM) 1 g in dextrose 5 % 50 mL IVPB  Status:  Discontinued     1  g 100 mL/hr over 30 Minutes Intravenous Every 8 hours 11/15/16 1331 11/15/16 1821   11/15/16 1400  vancomycin (VANCOCIN) IVPB 750 mg/150 ml premix     750 mg 150 mL/hr over 60 Minutes Intravenous  Once 11/15/16 1346 11/15/16 1535   11/15/16 1400  vancomycin (VANCOCIN) IVPB 1000 mg/200 mL premix  Status:  Discontinued     1,000 mg 200 mL/hr over 60 Minutes Intravenous  Once 11/15/16 1346 11/16/16 1135   11/15/16 1315  vancomycin (VANCOCIN) 1,750 mg in sodium chloride 0.9 % 500 mL IVPB  Status:  Discontinued     1,750 mg 250 mL/hr over 120 Minutes Intravenous  Once 11/15/16 1312 11/15/16 1346   11/15/16 1300  vancomycin (VANCOCIN) IVPB 1000 mg/200 mL premix  Status:  Discontinued     1,000 mg 200 mL/hr over 60 Minutes Intravenous  Once 11/15/16 1248 11/15/16 1412   11/15/16 1300  levofloxacin (LEVAQUIN) IVPB 750 mg     750 mg 100 mL/hr over 90 Minutes Intravenous  Once 11/15/16 1252 11/15/16 1442   11/15/16 1300  aztreonam (AZACTAM) 2 g in dextrose 5 % 50 mL IVPB     2 g 100 mL/hr over 30 Minutes Intravenous  Once 11/15/16 1252 11/15/16 1345      Procedures: None  CONSULTS:  None  Time spent: 25- minutes-Greater than 50% of this time was spent in counseling, explanation of diagnosis, planning of further management, and coordination of care.  MEDICATIONS: Scheduled Meds: . aspirin EC  81 mg Oral Daily  . atorvastatin  40 mg Oral Daily  . aztreonam  1 g Intravenous Q8H  . cholecalciferol  1,000 Units Oral Daily  . clopidogrel  75 mg Oral Daily  . heparin  5,000 Units Subcutaneous Q8H  . sodium chloride flush  3 mL Intravenous Q12H  . tamsulosin  0.4 mg Oral QHS  . cyanocobalamin  1,000 mcg Oral Daily   Continuous Infusions: . dextrose 5 % with kcl     PRN Meds:.acetaminophen, albuterol, hydrocerin, [DISCONTINUED] ondansetron **OR** ondansetron (ZOFRAN) IV, polyethylene glycol, traZODone   PHYSICAL EXAM: Vital signs: Vitals:   11/19/16 2032 11/19/16 2350 11/20/16 0357  11/20/16 0721  BP: (!) 134/57 (!) 122/57 (!) 125/54 (!) 117/48  Pulse: (!) 50 (!) 57 (!) 56 (!) 46  Resp: (!) 25  Temp: 98.7 F (37.1 C) 98.3 F (36.8 C) 97.6 F (36.4 C) 97.4 F (36.3 C)  TempSrc:    Oral  SpO2: 98% 100% 100% 100%  Weight:   68.6 kg (151 lb 3.2 oz)   Height:       Filed Weights   11/18/16 0410 11/19/16 0437 11/20/16 0357  Weight: 69.8 kg (153 lb 14.4 oz) 70 kg (154 lb 6.4 oz) 68.6 kg (151 lb 3.2 oz)   Body mass index is 22.33 kg/m.   General appearance :Awake, alert, not in any distress-but is very frail and chronically sick appearing. Eyes:, pupils equally reactive to light and accomodation,no scleral icterus. HEENT: Atraumatic and Normocephalic Neck: supple, no JVD. No cervical lymphadenopathy.  Resp:Good air entry bilaterally CVS: S1 S2 regular GI: Bowel sounds present, Non tender and not distended with  no gaurding, rigidity or rebound. Extremities: B/L Lower Ext shows no edema, both legs are warm to touch Neurology:  speech clear,Non focal, sensation is grossly intact. Musculoskeletal:No digital cyanosis Wounds:N/A  I have personally reviewed following labs and imaging studies  LABORATORY DATA: CBC:  Recent Labs Lab 11/13/16 1838 11/15/16 1221 11/16/16 0551 11/17/16 0502  WBC 7.2 8.9 7.3 6.7  NEUTROABS  --  6.8 5.0  --   HGB 13.9 13.4 10.7* 10.7*  HCT 45.3 44.7 36.1* 36.4*  MCV 78.6 79.7 79.2 78.8  PLT 272 274 193 179    Basic Metabolic Panel:  Recent Labs Lab 11/16/16 1042 11/17/16 0502 11/18/16 0255 11/19/16 0428 11/19/16 0813 11/20/16 0255  NA 149* 149* 151* 146*  --  144  K 3.7 3.6 3.6 3.1*  --  3.4*  CL 120* 122* 123* 119*  --  117*  CO2 19* 19* 18* 19*  --  19*  GLUCOSE 100* 90 83 121*  --  122*  BUN 53* 42* 37* 26*  --  21*  CREATININE 1.37* 1.25* 1.15 0.97  --  0.95  CALCIUM 8.0* 8.1* 8.1* 7.9*  --  7.9*  MG  --   --   --   --  2.2  --     GFR: Estimated Creatinine Clearance: 54.2 mL/min (by C-G formula  based on SCr of 0.95 mg/dL).  Liver Function Tests:  Recent Labs Lab 11/15/16 1221 11/16/16 0551 11/20/16 0255  AST 48* 37 41  ALT 31 24 27   ALKPHOS 58 46 52  BILITOT 0.9 0.5 0.5  PROT 7.6 5.6* 5.6*  ALBUMIN 3.0* 2.2* 2.3*   No results for input(s): LIPASE, AMYLASE in the last 168 hours. No results for input(s): AMMONIA in the last 168 hours.  Coagulation Profile:  Recent Labs Lab 11/15/16 1850  INR 1.18    Cardiac Enzymes:  Recent Labs Lab 11/13/16 1838  TROPONINI 0.05*    BNP (last 3 results) No results for input(s): PROBNP in the last 8760 hours.  HbA1C: No results for input(s): HGBA1C in the last 72 hours.  CBG:  Recent Labs Lab 11/13/16 1846  GLUCAP 121*    Lipid Profile: No results for input(s): CHOL, HDL, LDLCALC, TRIG, CHOLHDL, LDLDIRECT in the last 72 hours.  Thyroid Function Tests: No results for input(s): TSH, T4TOTAL, FREET4, T3FREE, THYROIDAB in the last 72 hours.  Anemia Panel: No results for input(s): VITAMINB12, FOLATE, FERRITIN, TIBC, IRON, RETICCTPCT in the last 72 hours.  Urine analysis:    Component Value Date/Time   COLORURINE AMBER (A) 11/15/2016 1224   APPEARANCEUR CLOUDY (A) 11/15/2016 1224   LABSPEC 1.019 11/15/2016 1224   PHURINE 5.0 11/15/2016 1224   GLUCOSEU NEGATIVE 11/15/2016 1224   HGBUR LARGE (A) 11/15/2016 1224   BILIRUBINUR NEGATIVE 11/15/2016 1224   KETONESUR NEGATIVE 11/15/2016 1224   PROTEINUR 100 (A) 11/15/2016 1224   UROBILINOGEN 4.0 (H) 12/24/2012 0929   NITRITE NEGATIVE 11/15/2016 1224   LEUKOCYTESUR MODERATE (A) 11/15/2016 1224    Sepsis Labs: Lactic Acid, Venous    Component Value Date/Time   LATICACIDVEN 1.71 11/15/2016 1604    MICROBIOLOGY: Recent Results (from the past 240 hour(s))  Urine culture     Status: None   Collection Time: 11/15/16 12:24 PM  Result Value Ref Range Status   Specimen Description URINE, RANDOM  Final   Special Requests NONE  Final   Culture NO GROWTH  Final    Report Status 11/16/2016 FINAL  Final  Blood Culture (routine  x 2)     Status: None (Preliminary result)   Collection Time: 11/15/16 12:41 PM  Result Value Ref Range Status   Specimen Description BLOOD RIGHT HAND  Final   Special Requests BOTTLES DRAWN AEROBIC AND ANAEROBIC 5CC  Final   Culture NO GROWTH 4 DAYS  Final   Report Status PENDING  Incomplete  Blood Culture (routine x 2)     Status: None (Preliminary result)   Collection Time: 11/15/16  1:00 PM  Result Value Ref Range Status   Specimen Description BLOOD LEFT FOREARM  Final   Special Requests BOTTLES DRAWN AEROBIC AND ANAEROBIC 5CC  Final   Culture NO GROWTH 4 DAYS  Final   Report Status PENDING  Incomplete  MRSA PCR Screening     Status: None   Collection Time: 11/15/16  7:28 PM  Result Value Ref Range Status   MRSA by PCR NEGATIVE NEGATIVE Final    Comment:        The GeneXpert MRSA Assay (FDA approved for NASAL specimens only), is one component of a comprehensive MRSA colonization surveillance program. It is not intended to diagnose MRSA infection nor to guide or monitor treatment for MRSA infections.     RADIOLOGY STUDIES/RESULTS: Dg Chest 2 View  Result Date: 11/13/2016 CLINICAL DATA:  Evaluate for pulmonary edema EXAM: CHEST  2 VIEW COMPARISON:  11/04/2016 FINDINGS: Chronic mild cardiomegaly. There is low volume chest with diffuse interstitial opacity but no Kerley lines or effusion. Current opacities likely related to patient's pulmonary fibrosis as seen on abdominal CT 11/01/2016. No air bronchograms or asymmetric density. No pneumothorax. IMPRESSION: Pulmonary fibrosis.  No evidence of acute superimposed disease. Electronically Signed   By: Marnee Spring M.D.   On: 11/13/2016 18:56   Dg Chest 2 View  Result Date: 11/04/2016 CLINICAL DATA:  Short of breath, swelling in extremities and eyelids. EXAM: CHEST  2 VIEW COMPARISON:  10/16/2016 FINDINGS: Normal cardiac silhouette. There is mild interstitial edema  pattern similar to comparison exam. This interstitial pattern is superimposed on chronic interstitial lung disease. No effusion, infiltrate pneumothorax. IMPRESSION: Interstitial edema superimposed on chronic interstitial lung disease. Electronically Signed   By: Genevive Bi M.D.   On: 11/04/2016 18:46   US Aorta  Result Date: 11/06/2016 CLINICAL DATA:  Abdominal aortic aneurysm. EXAM: ULTRASOUND OF ABDOMINAL AORTA TECHNIQUE: Ultrasound examination of the abdominal aorta was performed to evaluate for abdominal aortic aneurysm. COMPARISON:  CT scan of November 01, 2016. FINDINGS: Abdominal Aorta Infrarenal fusiform abdominal aortic aneurysm is noted with maximum measured transverse diameter of 6.4 cm. Maximum Diameter: 6.4 cm distally. IMPRESSION: 6.4 cm infrarenal abdominal aortic aneurysm. Due to the increased risk of rupture for abdominal aortic aneurysms of this size, vascular surgery consultation is recommended. This recommendation follows ACR consensus guidelines: White Paper of the ACR Incidental Findings Committee II on Vascular Findings. J Am Coll Radiol 2013; 10:789-794. These results will be called to the ordering clinician or representative by the Radiologist Assistant, and communication documented in the PACS or zVision Dashboard. Electronically Signed   By: Lupita Raider, M.D.   On: 11/06/2016 16:36   Dg Chest Port 1 View  Result Date: 11/15/2016 CLINICAL DATA:  Weakness, shortness of Breath EXAM: PORTABLE CHEST 1 VIEW COMPARISON:  11/13/2016 FINDINGS: Cardiomediastinal silhouette is stable. Stable bilateral reticular mild interstitial prominence and peripheral fibrotic changes. No definite superimposed infiltrate or pulmonary edema. IMPRESSION: Stable bilateral reticular mild interstitial prominence and peripheral fibrotic changes. No definite superimposed infiltrate or pulmonary edema.  Electronically Signed   By: Natasha Mead M.D.   On: 11/15/2016 13:04     LOS: 5 days    Netta Cedars M.D on 11/20/2016 at 8:02 AM    After 7pm go to [email or web address] - password [facility]

## 2016-11-20 NOTE — Evaluation (Signed)
Clinical/Bedside Swallow Evaluation Patient Details  Name: Christopher [name] MRN: [id number] Date of Birth: 1928/03/19  Today's Date: 11/20/2016 Time: SLP Start Time (ACUTE ONLY): 1558 SLP Stop Time (ACUTE ONLY): 1613 SLP Time Calculation (min) (ACUTE ONLY): 15 min  Past Medical History:  Past Medical History:  Diagnosis Date  . AAA (abdominal aortic aneurysm) (HCC)   . BPH (benign prostatic hyperplasia)   . Hemorrhoids   . Hypertension    Past Surgical History:  Past Surgical History:  Procedure Laterality Date  . CAROTID STENT    . LEFT HEART CATHETERIZATION WITH CORONARY ANGIOGRAM N/A 12/20/2012   Procedure: LEFT HEART CATHETERIZATION WITH CORONARY ANGIOGRAM;  Surgeon: Laurey Morale, MD;  Location: [facility] CATH LAB;  Service: Cardiovascular;  Laterality: N/A;  . PERCUTANEOUS CORONARY STENT INTERVENTION (PCI-S) N/A 12/25/2012   Procedure: PERCUTANEOUS CORONARY STENT INTERVENTION (PCI-S);  Surgeon: Kathleene Hazel, MD;  Location: [facility] CATH LAB;  Service: Cardiovascular;  Laterality: N/A;   HPI:  83 y.o.maleknown history of diastolic heart failure, no history of A. fib not on anticoagulation, AAA followed by vascular surgery, recent hernia repair, chronic Foley catheter in place (in place since hernia repair) brought to the hospital with worsening generalized weakness, poor appetite. Thought to have sepsis secondary to catheter-related UTI, and admitted to the hospitalist service.  Seen during prior admission for bedside swallowing evaluation, 12/31/12 with findings of normal oropharyngeal swallow function, noted pt complaints of esophageal symptoms including coughing at night. SLP encouraged pt to f/u with MD re GERD.   Assessment / Plan / Recommendation Clinical Impression  Pt presents with functional oropharyngeal swallow, marked primarily by reduced attention and oral holding of boluses, requiring cues to swallow.  Once initiated, there are no overt s/s of aspiration.  Recommend  continuing current diet, with 1:1 assist as needed for encouragement and cues.  No further SLP f/u is warranted.  SLP Visit Diagnosis: Dysphagia, unspecified (R13.10)    Aspiration Risk  Mild aspiration risk    Diet Recommendation   per pt's preference - regular. Thin liquids  Medication Administration: Whole meds with puree    Other  Recommendations Oral Care Recommendations: Oral care BID   Follow up Recommendations None      Frequency and Duration            Prognosis        Swallow Study   General Date of Onset: 11/15/16 HPI: 83 y.o.maleknown history of diastolic heart failure, no history of A. fib not on anticoagulation, AAA followed by vascular surgery, recent hernia repair, chronic Foley catheter in place (in place since hernia repair) brought to the hospital with worsening generalized weakness, poor appetite. Thought to have sepsis secondary to catheter-related UTI, and admitted to the hospitalist service.  Seen during prior admission for bedside swallowing evaluation, 12/31/12 with findings of normal oropharyngeal swallow function, noted pt complaints of esophageal symptoms including coughing at night. SLP encouraged pt to f/u with MD re GERD. Type of Study: MBS-Modified Barium Swallow Study Previous Swallow Assessment: see HPI Diet Prior to this Study: Regular;Thin liquids Temperature Spikes Noted: No Respiratory Status: Room air History of Recent Intubation: No Behavior/Cognition: Alert;Confused Oral Cavity Assessment: Within Functional Limits Oral Care Completed by SLP: No Oral Cavity - Dentition: Edentulous Vision: Functional for self-feeding Self-Feeding Abilities: Needs assist Patient Positioning: Upright in bed Baseline Vocal Quality: Normal Volitional Cough: Strong Volitional Swallow: Able to elicit    Oral/Motor/Sensory Function Overall Oral Motor/Sensory Function: Within functional limits   Ice Chips  Ice chips: Within functional limits   Thin Liquid  Thin Liquid: Within functional limits    Nectar Thick Nectar Thick Liquid: Not tested   Honey Thick Honey Thick Liquid: Not tested   Puree Puree: Impaired Presentation: Spoon Oral Phase Functional Implications: Oral holding   Solid   GO   Solid: Not tested        Christopher [name] 11/20/2016,4:15 PM

## 2016-11-20 NOTE — Care Management Note (Addendum)
Case Management Note Previous CM note initiated by Gala Lewandowsky, RN--11/16/2016, 2:57 PM   Patient Details  Name: Christopher [name] MRN: [id number] Date of Birth: [birth year]  Subjective/Objective: Pt presented for Sepsis. Pt with increased weakness from home. Pt alert to self at time of visit. No family at bedside at time of conversation to discuss disposition needs.                    Action/Plan: CM will continue to monitor for disposition needs.   Expected Discharge Date:                  Expected Discharge Plan:  Home w Hospice Care  In-House Referral:  NA  Discharge planning Services  CM Consult  Post Acute Care Choice:  Hospice Choice offered to:  [facility] POA / Guardian  DME Arranged:  Hospice Equipment Package A DME Agency:  [facility]  HH Arranged:  Disease Management HH Agency:  [facility]  Status of Service:  Completed, signed off  If discussed at [employer] of Stay Meetings, dates discussed:  4/3  Discharge Disposition: home with hospice   Additional Comments:  11/20/16- 1230- Donn Pierini RN, CM- referral received for home hospice referral- call made to pt's nephew- [name]- [phone]- to offer Hospice Choice-for [facility]- per [place] he has deferred decision to pt's caregiver- [name]- [phone])- who was with [name] at time of phone call- per discussion with [name]- choice was offered and [name] has chosen [facility] for Home Hospice services- per [name] they will need a hospital bed and w/c at home- and plan will be for pt to transport via ambulance home. Have confirmed address in epic along with phone # to be correct for discharge. Referral called to Amy with [facility] for home hospice services.   Update 11/20/16- 1500- Sierra Bissonette RN, CM- received call from Adele Barthel- [facility]- notified that they would not be able to accept referral- due to pt's location. Call made to [name] to offer choice again- per [name] will try [facility]- referral called to [place] at [facility]- info faxed to them at [phone] via epic- they will review and contact family.   Zenda Alpers [place], RN 11/20/2016, 2:00 PM [phone]

## 2016-11-20 NOTE — Care Management Important Message (Signed)
Important Message  Patient Details  Name: DAQUON GREENLEAF MRN: [id number] Date of Birth: 1928/04/15   Medicare Important Message Given:  Yes    Leonardo Makris 11/20/2016, 4:14 PM

## 2016-11-20 NOTE — Progress Notes (Addendum)
[facility] Hospital Liaison:  RN visit  Patient information sent for review.  Due to location, we are unable to provide services at this time.   Governor Specking, CMRN of same.   Thank you for the referral.  Adele Barthel, RN, BSN [facility] Liaison [phone]  All hospital liaison's are now on AMION.

## 2016-11-20 NOTE — Progress Notes (Signed)
Daily Progress Note   Patient Name: Christopher [name]       Date: 11/20/2016 DOB: [birth year]  Age: 83 y.o. MRN#: [id number] Attending Physician: Richarda Overlie, MD Primary Care Physician: Louie Boston, MD Admit Date: 11/15/2016  Reason for Consultation/Follow-up: Establishing goals of care and Psychosocial/spiritual support  Subjective:  -continued conversation with caregiver/ Georga Bora regarding diagnosis, prognosis, GOC and disposition options  - the difference between a aggressive medical intervention path  and a palliative comfort care path for this patient at this time was had.  Values and goals of care important to patient and family were attempted to be elicited.  - concept of Hospice discussed, family desires for pateint to return home when stable for discharge with hsopice  - natural trajectory and expectations at EOL were discussed.  Questions and concerns addressed.   Family encouraged to call with questions or concerns.  PMT will continue to support holistically.   Length of Stay: 5  Current Medications: Scheduled Meds:  . aspirin EC  81 mg Oral Daily  . atorvastatin  40 mg Oral Daily  . cholecalciferol  1,000 Units Oral Daily  . clopidogrel  75 mg Oral Daily  . heparin  5,000 Units Subcutaneous Q8H  . potassium chloride  40 mEq Oral Once  . sodium chloride flush  3 mL Intravenous Q12H  . tamsulosin  0.4 mg Oral QHS  . cyanocobalamin  1,000 mcg Oral Daily    Continuous Infusions: . dextrose 5 % with kcl      PRN Meds: acetaminophen, albuterol, hydrocerin, [DISCONTINUED] ondansetron **OR** ondansetron (ZOFRAN) IV, polyethylene glycol, traZODone  Physical Exam  Constitutional: He appears lethargic. He appears cachectic.  HENT:  Mouth/Throat: Mucous membranes are  dry.  Pulmonary/Chest: He has decreased breath sounds in the right lower field and the left lower field.  Musculoskeletal:  generalized weakness and atrophy  Neurological: He appears lethargic.  Skin: Skin is warm and dry.            Vital Signs: BP (!) 122/52 (BP Location: Right Arm)   Pulse (!) 42   Temp 97.7 F (36.5 C) (Oral)   Resp 18   Ht  (1.753 m)   Wt 68.6 kg (151 lb 3.2 oz)   SpO2 100%   BMI 22.33 kg/m  SpO2: SpO2: 100 %  O2 Device: O2 Device: Not Delivered O2 Flow Rate:    Intake/output summary:  Intake/Output Summary (Last 24 hours) at 11/20/16 1239 Last data filed at 11/20/16 0356  Gross per 24 hour  Intake              765 ml  Output              950 ml  Net             -185 ml   LBM: Last BM Date: 11/19/16 Baseline Weight: Weight: 72.6 kg (160 lb) Most recent weight: Weight: 68.6 kg (151 lb 3.2 oz)       Palliative Assessment/Data:  30%     Flowsheet Rows     Most Recent Value  Intake Tab  Referral Department  Hospitalist  Unit at Time of Referral  Med/Surg Unit  Palliative Care Primary Diagnosis  Sepsis/Infectious Disease  Date Notified  11/15/16  Palliative Care Type  New Palliative care  Reason for referral  Clarify Goals of Care  Date of Admission  11/15/16  Date first seen by Palliative Care  11/16/16  # of days Palliative referral response time  1 Day(s)  # of days IP prior to Palliative referral  0  Clinical Assessment  Palliative Performance Scale Score  30%  Psychosocial & Spiritual Assessment  Palliative Care Outcomes      Patient Active Problem List   Diagnosis Date Noted  . Pressure injury of skin 11/16/2016  . DNR (do not resuscitate) 11/16/2016  . Palliative care by specialist 11/16/2016  . Adult failure to thrive 11/16/2016  . Sepsis (HCC) 11/15/2016  . Dehydration 11/15/2016  . AKI (acute kidney injury) (HCC) 11/15/2016  . Acute on chronic systolic heart failure (HCC) 11/04/2016  . Elevated troponin 11/04/2016  .  Atrial fibrillation (HCC) 11/04/2016  . H/O inguinal hernia repair 11/04/2016  . AAA (abdominal aortic aneurysm) without rupture (HCC) 11/03/2016  . HTN (hypertension) 07/10/2013  . Chronic coronary artery disease 04/30/2013  . 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 Scleroderma (HCC) 01/09/2013  . Chronic systolic heart failure (HCC) 01/09/2013  . Nodule of right lung, 8 mm right lower lobe in May 2014 01/01/2013  . Pneumonitis 01/01/2013  . Esophageal dysmotility 01/01/2013  . Arteriosclerotic cardiovascular disease (ASCVD) 12/29/2012  . Acute-on-chronic respiratory failure (HCC) 12/26/2012  . UTI (urinary tract infection) 12/24/2012    Palliative Care Assessment & Plan   Patient Profile:  83 y.o. male   admitted on 11/15/2016 with past medical history significant for CAD, hypertension, atrial fibrillation, chronic diastolicCHF, abdominal aortic aneurysm who was recently hospitalized with acute exacerbation of CHF and UTI and discharged home on 11/08/2016.   He has an indwelling foley cath  The patient presented to the hospital with worsening of weakness. His caregiver who is present at bedside said that since the day of discharge patient weak, but was able to get out of the bed and had a decent appetite.   However today when she attempted to lift him from the bed he was profoundly weak and unable to stand started shaking and falling. She called paramedics and patient was delivered to the emergency room for evaluation.   Per caregiver there has been continued physical, functional and cognitive decline the past several months  Patient and family face advanced directive decisions and anticipatory care needs  Assessment:  -overall failure to thrive -long term  poor prognosis  Recommendations/Plan:  Hopeful to discharge home with hospice services   Goals  of Care and Additional Recommendations:  Limitations on Scope of Treatment: Avoid Hospitalization  Code Status:    Code Status Orders        Start     Ordered   11/15/16 1755  Do not attempt resuscitation (DNR)  Continuous    Question Answer Comment  In the event of cardiac or respiratory ARREST Do not call a "code blue"   In the event of cardiac or respiratory ARREST Do not perform Intubation, CPR, defibrillation or ACLS   In the event of cardiac or respiratory ARREST Use medication by any route, position, wound care, and other measures to relive pain and suffering. May use oxygen, suction and manual treatment of airway obstruction as needed for comfort.      11/15/16 1759    Code Status History    Date Active Date Inactive Code Status Order ID Comments User Context   11/04/2016  8:21 PM 11/08/2016  6:38 PM DNR [id number]  Levie Heritage, DO ED    Advance Directive Documentation     Most Recent Value  Type of Advance Directive  Healthcare Power of Attorney, Living will  Pre-existing out of facility DNR order (yellow form or pink MOST form)  -  "MOST" Form in Place?  -       Prognosis:   < 6 months  Discharge Planning:  Home with Hospice  Care plan was discussed with Dr Susie Cassette  Thank you for allowing the Palliative Medicine Team to assist in the care of this patient.   Time In: 1130 Time Out: 1205 Total Time 35 min Prolonged Time Billed  no       Greater than 50%  of this time was spent counseling and coordinating care related to the above assessment and plan.  Lorinda Creed, NP  Please contact Palliative Medicine Team phone at [phone] for questions and concerns.

## 2016-11-21 ENCOUNTER — Encounter: Payer: Medicare Other | Admitting: Cardiovascular Disease

## 2016-11-21 DIAGNOSIS — R531 Weakness: Secondary | ICD-10-CM

## 2016-11-21 LAB — BASIC METABOLIC PANEL
Anion gap: 4 — ABNORMAL LOW (ref 5–15)
BUN: 15 mg/dL (ref 6–20)
CALCIUM: 7.9 mg/dL — AB (ref 8.9–10.3)
CHLORIDE: 117 mmol/L — AB (ref 101–111)
CO2: 18 mmol/L — AB (ref 22–32)
Creatinine, Ser: 0.88 mg/dL (ref 0.61–1.24)
GFR calc non Af Amer: >60 mL/min (ref >60)
Glucose, Bld: 105 mg/dL — ABNORMAL HIGH (ref 65–99)
Potassium: 4 mmol/L (ref 3.5–5.1)
Sodium: 139 mmol/L (ref 135–145)

## 2016-11-21 MED ORDER — MAGNESIUM OXIDE 400 MG PO CAPS: 400.0000 mg | ORAL_CAPSULE | Freq: Every morning | ORAL | 2 refills | Status: AC

## 2016-11-21 MED ORDER — POTASSIUM CHLORIDE CRYS ER 20 MEQ PO TBCR: 20.0000 meq | EXTENDED_RELEASE_TABLET | Freq: Three times a day (TID) | ORAL | 2 refills | Status: AC

## 2016-11-21 NOTE — Progress Notes (Addendum)
NCM contacted caregiver, [name] # [phone]. Hospital bed was delivered to home. [facility] arranged for 1 pm pick up. Unit RN will go over dc instructions with caregiver, [name] prior to dc. Faxed dc summary to [facility]. Contacted [facility] to make aware. Isidoro Donning RN CCM Case Mgmt phone [phone]

## 2016-11-21 NOTE — Plan of Care (Signed)
Problem: Health Behavior/Discharge Planning: Goal: Ability to manage health-related needs will improve Outcome: Adequate for Discharge Patient being discharged home per Dr. Susie Cassette order. [facility] will transport patient home, where [name], [place] will be. [phone].

## 2016-11-21 NOTE — Progress Notes (Signed)
Patient being discharged to home Hospice with his care taker [name]. Called and gave [name] discharge instructions [phone]. [name] voiced understanding of instructions and where to pick up patients two prescriptions in [place]. Included a copy of discharge instructions in his bag that will be taking with [facility] when they transport him home.

## 2016-11-21 NOTE — Discharge Instructions (Signed)
Medication Administration: Whole meds with puree   Pure diet, thin liquids

## 2016-11-21 NOTE — Plan of Care (Signed)
Problem: Safety: Goal: Ability to remain free from injury will improve Outcome: Completed/Met Date Met: 11/21/16 Patienty is on bedrest , yellow socks are on and bed alarm is activated. Patients call bell is placed with in easy reaching distance for patient to call for needs.

## 2016-11-21 NOTE — Discharge Summary (Signed)
Physician Discharge Summary  Christopher [name] MRN: [id number] DOB/AGE: 12-04-1927 83 y.o.  PCP: Deloria Lair, MD   Admit date: 11/15/2016 Discharge date: 11/21/2016  Discharge Diagnoses:    Active Problems:   UTI (urinary tract infection)   Sepsis ([facility])   Dehydration   AKI (acute kidney injury) ([place])   Pressure injury of skin   DNR (do not resuscitate)   Palliative care by specialist   Adult failure to thrive    Follow-up recommendations  Patient is being discharged home with hospice services Code Status/Advance Care Planning:  DNR       Current Discharge Medication List    START taking these medications   Details  Magnesium Oxide 400 MG CAPS Take 1 capsule (400 mg total) by mouth every morning. Qty: 30 each, Refills: 2      CONTINUE these medications which have CHANGED   Details  potassium chloride SA (K-DUR,KLOR-CON) 20 MEQ tablet Take 1 tablet (20 mEq total) by mouth 3 (three) times daily. Qty: 90 tablet, Refills: 2      CONTINUE these medications which have NOT CHANGED   Details  aspirin EC 81 MG EC tablet Take 1 tablet (81 mg total) by mouth daily.    atorvastatin (LIPITOR) 40 MG tablet Take 40 mg by mouth daily.    cholecalciferol (VITAMIN D) 1000 units tablet Take 1,000 Units by mouth daily.    clopidogrel (PLAVIX) 75 MG tablet Take 1 tablet (75 mg total) by mouth daily. Qty: 30 tablet, Refills: 2    cyanocobalamin 1000 MCG tablet Take 1,000 mcg by mouth daily.    meclizine (ANTIVERT) 12.5 MG tablet Take 12.5 mg by mouth 3 (three) times daily as needed for dizziness.    PROAIR HFA 108 (90 Base) MCG/ACT inhaler Inhale 1-2 puffs into the lungs every 6 (six) hours as needed for wheezing or shortness of breath.     tamsulosin (FLOMAX) 0.4 MG CAPS capsule Take 1 capsule (0.4 mg total) by mouth at bedtime. Qty: 30 capsule, Refills: 2      STOP taking these medications     enalapril (VASOTEC) 5 MG tablet      furosemide (LASIX) 40 MG tablet       metoprolol succinate (TOPROL-XL) 25 MG 24 hr tablet      spironolactone (ALDACTONE) 25 MG tablet          Discharge Condition: *Prognosis guarded   Discharge Instructions Get Medicines reviewed and adjusted: Please take all your medications with you for your next visit with your Primary MD  Please request your Primary MD to go over all hospital tests and procedure/radiological results at the follow up, please ask your Primary MD to get all Hospital records sent to his/her office.  If you experience worsening of your admission symptoms, develop shortness of breath, life threatening emergency, suicidal or homicidal thoughts you must seek medical attention immediately by calling 911 or calling your MD immediately if symptoms less severe.  You must read complete instructions/literature along with all the possible adverse reactions/side effects for all the Medicines you take and that have been prescribed to you. Take any new Medicines after you have completely understood and accpet all the possible adverse reactions/side effects.   Do not drive when taking Pain medications.   Do not take more than prescribed Pain, Sleep and Anxiety Medications  Special Instructions: If you have smoked or chewed Tobacco in the last 2 yrs please stop smoking, stop any regular Alcohol and or any Recreational drug  use.  Wear Seat belts while driving.  Please note  You were cared for by a hospitalist during your hospital stay. Once you are discharged, your primary care physician will handle any further medical issues. Please note that NO REFILLS for any discharge medications will be authorized once you are discharged, as it is imperative that you return to your primary care physician (or establish a relationship with a primary care physician if you do not have one) for your aftercare needs so that they can reassess your need for medications and monitor your lab values.  Discharge Instructions    Diet -  low sodium heart healthy    Complete by:  As directed    Increase activity slowly    Complete by:  As directed        Allergies  Allergen Reactions  . Cefuroxime Axetil Hives and Itching      Disposition: Home with hospice   Consults: Palliative care     Significant Diagnostic Studies:  Dg Chest 2 View  Result Date: 11/13/2016 CLINICAL DATA:  Evaluate for pulmonary edema EXAM: CHEST  2 VIEW COMPARISON:  11/04/2016 FINDINGS: Chronic mild cardiomegaly. There is low volume chest with diffuse interstitial opacity but no Kerley lines or effusion. Current opacities likely related to patient's pulmonary fibrosis as seen on abdominal CT 11/01/2016. No air bronchograms or asymmetric density. No pneumothorax. IMPRESSION: Pulmonary fibrosis.  No evidence of acute superimposed disease. Electronically Signed   By: Monte Fantasia M.D.   On: 11/13/2016 18:56   Dg Chest 2 View  Result Date: 11/04/2016 CLINICAL DATA:  Short of breath, swelling in extremities and eyelids. EXAM: CHEST  2 VIEW COMPARISON:  10/16/2016 FINDINGS: Normal cardiac silhouette. There is mild interstitial edema pattern similar to comparison exam. This interstitial pattern is superimposed on chronic interstitial lung disease. No effusion, infiltrate pneumothorax. IMPRESSION: Interstitial edema superimposed on chronic interstitial lung disease. Electronically Signed   By: Suzy Bouchard M.D.   On: 11/04/2016 18:46   US Aorta  Result Date: 11/06/2016 CLINICAL DATA:  Abdominal aortic aneurysm. EXAM: ULTRASOUND OF ABDOMINAL AORTA TECHNIQUE: Ultrasound examination of the abdominal aorta was performed to evaluate for abdominal aortic aneurysm. COMPARISON:  CT scan of November 01, 2016. FINDINGS: Abdominal Aorta Infrarenal fusiform abdominal aortic aneurysm is noted with maximum measured transverse diameter of 6.4 cm. Maximum Diameter: 6.4 cm distally. IMPRESSION: 6.4 cm infrarenal abdominal aortic aneurysm. Due to the increased risk  of rupture for abdominal aortic aneurysms of this size, vascular surgery consultation is recommended. This recommendation follows ACR consensus guidelines: White Paper of the ACR Incidental Findings Committee II on Vascular Findings. J Am Coll Radiol 2013; 10:789-794. These results will be called to the ordering clinician or representative by the Radiologist Assistant, and communication documented in the PACS or zVision Dashboard. Electronically Signed   By: Marijo Conception, M.D.   On: 11/06/2016 16:36   Dg Chest Port 1 View  Result Date: 11/15/2016 CLINICAL DATA:  Weakness, shortness of Breath EXAM: PORTABLE CHEST 1 VIEW COMPARISON:  11/13/2016 FINDINGS: Cardiomediastinal silhouette is stable. Stable bilateral reticular mild interstitial prominence and peripheral fibrotic changes. No definite superimposed infiltrate or pulmonary edema. IMPRESSION: Stable bilateral reticular mild interstitial prominence and peripheral fibrotic changes. No definite superimposed infiltrate or pulmonary edema. Electronically Signed   By: Lahoma Crocker M.D.   On: 11/15/2016 13:04        Filed Weights   11/18/16 0410 11/19/16 0437 11/20/16 0357  Weight: 69.8 kg (153 lb 14.4 oz)  70 kg (154 lb 6.4 oz) 68.6 kg (151 lb 3.2 oz)     Microbiology: Recent Results (from the past 240 hour(s))  Urine culture     Status: None   Collection Time: 11/15/16 12:24 PM  Result Value Ref Range Status   Specimen Description URINE, RANDOM  Final   Special Requests NONE  Final   Culture NO GROWTH  Final   Report Status 11/16/2016 FINAL  Final  Blood Culture (routine x 2)     Status: None   Collection Time: 11/15/16 12:41 PM  Result Value Ref Range Status   Specimen Description BLOOD RIGHT HAND  Final   Special Requests BOTTLES DRAWN AEROBIC AND ANAEROBIC 5CC  Final   Culture NO GROWTH 5 DAYS  Final   Report Status 11/20/2016 FINAL  Final  Blood Culture (routine x 2)     Status: None   Collection Time: 11/15/16  1:00 PM  Result  Value Ref Range Status   Specimen Description BLOOD LEFT FOREARM  Final   Special Requests BOTTLES DRAWN AEROBIC AND ANAEROBIC 5CC  Final   Culture NO GROWTH 5 DAYS  Final   Report Status 11/20/2016 FINAL  Final  MRSA PCR Screening     Status: None   Collection Time: 11/15/16  7:28 PM  Result Value Ref Range Status   MRSA by PCR NEGATIVE NEGATIVE Final    Comment:        The GeneXpert MRSA Assay (FDA approved for NASAL specimens only), is one component of a comprehensive MRSA colonization surveillance program. It is not intended to diagnose MRSA infection nor to guide or monitor treatment for MRSA infections.        Blood Culture    Component Value Date/Time   SDES BLOOD LEFT FOREARM 11/15/2016 1300   SPECREQUEST BOTTLES DRAWN AEROBIC AND ANAEROBIC 5CC 11/15/2016 1300   CULT NO GROWTH 5 DAYS 11/15/2016 1300   REPTSTATUS 11/20/2016 FINAL 11/15/2016 1300      Labs: Results for orders placed or performed during the hospital encounter of 11/15/16 (from the past 48 hour(s))  Magnesium     Status: None   Collection Time: 11/19/16  8:13 AM  Result Value Ref Range   Magnesium 2.2 1.7 - 2.4 mg/dL  Comprehensive metabolic panel     Status: Abnormal   Collection Time: 11/20/16  2:55 AM  Result Value Ref Range   Sodium 144 135 - 145 mmol/L   Potassium 3.4 (L) 3.5 - 5.1 mmol/L   Chloride 117 (H) 101 - 111 mmol/L   CO2 19 (L) 22 - 32 mmol/L   Glucose, Bld 122 (H) 65 - 99 mg/dL   BUN 21 (H) 6 - 20 mg/dL   Creatinine, Ser 0.95 0.61 - 1.24 mg/dL   Calcium 7.9 (L) 8.9 - 10.3 mg/dL   Total Protein 5.6 (L) 6.5 - 8.1 g/dL   Albumin 2.3 (L) 3.5 - 5.0 g/dL   AST 41 15 - 41 U/L   ALT 27 17 - 63 U/L   Alkaline Phosphatase 52 38 - 126 U/L   Total Bilirubin 0.5 0.3 - 1.2 mg/dL   GFR calc non Af Amer >60 >60 mL/min   GFR calc Af Amer >60 >60 mL/min    Comment: (NOTE) The eGFR has been calculated using the CKD EPI equation. This calculation has not been validated in all clinical  situations. eGFR's persistently <60 mL/min signify possible Chronic Kidney Disease.    Anion gap 8 5 - 15  Basic metabolic  panel     Status: Abnormal   Collection Time: 11/21/16  5:20 AM  Result Value Ref Range   Sodium 139 135 - 145 mmol/L   Potassium 4.0 3.5 - 5.1 mmol/L   Chloride 117 (H) 101 - 111 mmol/L   CO2 18 (L) 22 - 32 mmol/L   Glucose, Bld 105 (H) 65 - 99 mg/dL   BUN 15 6 - 20 mg/dL   Creatinine, Ser 0.88 0.61 - 1.24 mg/dL   Calcium 7.9 (L) 8.9 - 10.3 mg/dL   GFR calc non Af Amer >60 >60 mL/min   GFR calc Af Amer >60 >60 mL/min    Comment: (NOTE) The eGFR has been calculated using the CKD EPI equation. This calculation has not been validated in all clinical situations. eGFR's persistently <60 mL/min signify possible Chronic Kidney Disease.    Anion gap 4 (L) 5 - 15     Lipid Panel     Component Value Date/Time   CHOL 133 12/20/2012 0330   TRIG 106 12/20/2012 0330   HDL 24 (L) 12/20/2012 0330   CHOLHDL 5.5 12/20/2012 0330   VLDL 21 12/20/2012 0330   LDLCALC 88 12/20/2012 0330     Lab Results  Component Value Date   HGBA1C 6.2 (H) 12/19/2012       HPI :*   83 y.o. male   admitted on 11/15/2016 with past medical history significant for CAD, hypertension, atrial fibrillation, chronic diastolicCHF, abdominal aortic aneurysm who was recently hospitalized with acute exacerbation of CHF and UTI and discharged home on 11/08/2016.   He has an indwelling foley cath  The patient presented to the hospital with worsening of weakness. His caregiver who is present at bedside said that since the day of discharge patient weak, but was able to get out of the bed and had a decent appetite.   However today when she attempted to lift him from the bed he was profoundly weak and unable to stand started shaking and falling. She called paramedics and patient was delivered to the emergency room for evaluation.   Per caregiver there has been continued physical, functional and  cognitive decline the past several months  HOSPITAL COURSE: *  Sepsis secondary to catheter associated UTI: Sepsis pathophysiology has resolved-although clinically improved, patient received a total of 3 days of IV Aztreonam without any significant clinical improvement, urine culture and blood culture no growth so far, antibiotics discontinued   Hypernatremia: Very slow to improve, Likely due to dehydration-diuretic use. Patient treated with IV hydration with D5W and monitor. Very slow to improve despite continuous IV fluids. Suspect patient will become hypernatremic and hypokalemic once the fluids that discontinued. Overall prognosis poor  Hypokalemia-continue repletion, continues to be low secondary to poor oral intake. Also provided magnesium oxide  History of urinary retention: Apparently occurred after hernia surgery done a few weeks back (Feb 26) at [facility] sure if patient failed a voiding trial, he still has a Foley catheter in place.Continue with Foley catheter and Flomax.  Acute kidney injury: Secondary mild hemodynamically mediated kidney injury and improving with gentle hydration.  Hypertension: Continue to hold antihypertensives /diuretics, due to poor oral intake  .  Chronic diastolic heart failure EF 55-60%: Compensated-infected dry on my exam today. Continue to hold diuretics until sodium improves further.  Chronic Atrial fibrillation: [place] vasc 2 score of at least 3,  Chronic issue, currently rate controlled without the use of any rate limiting agents. Per chart review, anticoagulation has been stopped due  to recent hematochezia, increased bleeding risk.  AAA (6.4 cm infrarenal abdominal aortic aneurysm): Recently seen by vascular surgery as an outpatient, with plans for further discussion with family. Patient is very elderly and frail-doubt he will be able to tolerate any major intervention at this time.  Advanced dementia. Supportive care. Speech  therapy evaluation pending  Failure to thrive syndrome/generalized weakness, advanced dementia with poor baseline quality of life: Spoke at length with patient's nephew Mortimer Fries on 11/20/16 who is the primary decision maker on 11/18/2016. Patient is DO NOT RESUSCITATE.  Mortimer Fries is agreeable to pursuing home with hospice. Case management has made arrangements. Patient discharged, home with hospice once, everything has been arranged       Discharge Exam:   Blood pressure (!) 111/48, pulse (!) 53, temperature 97.8 F (36.6 C), temperature source Oral, resp. rate (!) 24, height 5' 9" (1.753 m), weight 68.6 kg (151 lb 3.2 oz), SpO2 100 %.  General appearance :Awake, alert, not in any distress-but is very frail and chronically sick appearing. Eyes:, pupils equally reactive to light and accomodation,no scleral icterus. HEENT: Atraumatic and Normocephalic Neck: supple, no JVD. No cervical lymphadenopathy.  Resp:Good air entry bilaterally CVS: S1 S2 regular GI: Bowel sounds present, Non tender and not distended with no gaurding, rigidity or rebound. Extremities: B/L Lower Ext shows no edema, both legs are warm to touch Neurology:  speech clear,Non focal, sensation is grossly intact. Musculoskeletal:No digital cyanosis    Follow-up Information    [facility] Follow [facility].   Why:  Home Hospice referral made for home hospice services Contact information: [address] [place] [address] [phone]        Deloria Lair, MD. Call.   Specialty:  Family Medicine Why:  Hospital follow-up in 3-5 days, check CBC, CMP, magnesium Contact information: [address] [place] [address] [phone]           Signed: Reyne Dumas 11/21/2016, 8:11 AM        Time spent >45 mins

## 2016-11-23 ENCOUNTER — Encounter: Payer: Self-pay | Admitting: Vascular Surgery

## 2016-12-01 ENCOUNTER — Ambulatory Visit: Payer: Medicare Other | Admitting: Vascular Surgery

## 2016-12-12 ENCOUNTER — Ambulatory Visit: Payer: Medicare Other | Admitting: Urology

## 2016-12-19 DEATH — deceased

## 2022-04-03 NOTE — Telephone Encounter (Signed)
error
